# Patient Record
Sex: Female | Born: 1970 | Race: Black or African American | Hispanic: No | Marital: Single | Smoking: Never smoker
Health system: Southern US, Community
[De-identification: ages and names within clinical notes are randomized; demographics above are authoritative.]

## PROBLEM LIST (undated history)

## (undated) DIAGNOSIS — F419 Anxiety disorder, unspecified: Secondary | ICD-10-CM

## (undated) DIAGNOSIS — D649 Anemia, unspecified: Secondary | ICD-10-CM

## (undated) DIAGNOSIS — G4733 Obstructive sleep apnea (adult) (pediatric): Secondary | ICD-10-CM

## (undated) DIAGNOSIS — I10 Essential (primary) hypertension: Secondary | ICD-10-CM

## (undated) DIAGNOSIS — R51 Headache: Secondary | ICD-10-CM

## (undated) DIAGNOSIS — M1990 Unspecified osteoarthritis, unspecified site: Secondary | ICD-10-CM

## (undated) DIAGNOSIS — E119 Type 2 diabetes mellitus without complications: Secondary | ICD-10-CM

## (undated) DIAGNOSIS — R011 Cardiac murmur, unspecified: Secondary | ICD-10-CM

## (undated) DIAGNOSIS — M519 Unspecified thoracic, thoracolumbar and lumbosacral intervertebral disc disorder: Secondary | ICD-10-CM

## (undated) DIAGNOSIS — Z8709 Personal history of other diseases of the respiratory system: Secondary | ICD-10-CM

## (undated) DIAGNOSIS — F32A Depression, unspecified: Secondary | ICD-10-CM

## (undated) DIAGNOSIS — R519 Headache, unspecified: Secondary | ICD-10-CM

## (undated) DIAGNOSIS — T8859XA Other complications of anesthesia, initial encounter: Secondary | ICD-10-CM

## (undated) DIAGNOSIS — M545 Low back pain, unspecified: Secondary | ICD-10-CM

## (undated) DIAGNOSIS — J302 Other seasonal allergic rhinitis: Secondary | ICD-10-CM

## (undated) DIAGNOSIS — M5430 Sciatica, unspecified side: Secondary | ICD-10-CM

## (undated) DIAGNOSIS — E039 Hypothyroidism, unspecified: Secondary | ICD-10-CM

## (undated) DIAGNOSIS — G8929 Other chronic pain: Secondary | ICD-10-CM

## (undated) DIAGNOSIS — R7303 Prediabetes: Secondary | ICD-10-CM

## (undated) DIAGNOSIS — E079 Disorder of thyroid, unspecified: Secondary | ICD-10-CM

## (undated) DIAGNOSIS — T4145XA Adverse effect of unspecified anesthetic, initial encounter: Secondary | ICD-10-CM

## (undated) HISTORY — DX: Hypothyroidism, unspecified: E03.9

## (undated) HISTORY — DX: Unspecified thoracic, thoracolumbar and lumbosacral intervertebral disc disorder: M51.9

## (undated) HISTORY — DX: Essential (primary) hypertension: I10

## (undated) HISTORY — DX: Obstructive sleep apnea (adult) (pediatric): G47.33

---

## 1996-11-03 HISTORY — PX: DILATION AND CURETTAGE OF UTERUS: SHX78

## 2001-11-03 HISTORY — PX: SHOULDER OPEN ROTATOR CUFF REPAIR: SHX2407

## 2003-11-04 HISTORY — PX: CARPAL TUNNEL RELEASE: SHX101

## 2006-11-03 HISTORY — PX: CARPAL TUNNEL RELEASE: SHX101

## 2010-11-03 HISTORY — PX: TOTAL THYROIDECTOMY: SHX2547

## 2011-12-08 ENCOUNTER — Encounter (HOSPITAL_COMMUNITY): Payer: Self-pay | Admitting: *Deleted

## 2011-12-08 ENCOUNTER — Emergency Department (HOSPITAL_COMMUNITY)
Admission: EM | Admit: 2011-12-08 | Discharge: 2011-12-08 | Disposition: A | Discharge status: Home / Self Care | Payer: Self-pay | Source: Home / Self Care | Attending: Family Medicine | Admitting: Family Medicine

## 2011-12-08 DIAGNOSIS — J45901 Unspecified asthma with (acute) exacerbation: Secondary | ICD-10-CM

## 2011-12-08 DIAGNOSIS — J329 Chronic sinusitis, unspecified: Secondary | ICD-10-CM

## 2011-12-08 HISTORY — DX: Other seasonal allergic rhinitis: J30.2

## 2011-12-08 HISTORY — DX: Disorder of thyroid, unspecified: E07.9

## 2011-12-08 LAB — POCT RAPID STREP A: Streptococcus, Group A Screen (Direct): NEGATIVE

## 2011-12-08 MED ORDER — PREDNISONE 20 MG PO TABS: ORAL_TABLET | ORAL | Status: AC

## 2011-12-08 MED ORDER — AMOXICILLIN 500 MG PO CAPS: 500.0000 mg | ORAL_CAPSULE | Freq: Three times a day (TID) | ORAL | Status: AC

## 2011-12-08 MED ORDER — HYDROCODONE-ACETAMINOPHEN 7.5-500 MG/15ML PO SOLN: 15.0000 mL | Freq: Three times a day (TID) | ORAL | Status: AC | PRN

## 2011-12-08 NOTE — ED Notes (Signed)
Pt is here with complaints of 2 week history of fever, sore throat, bilat ear pain and runny nose.

## 2011-12-11 NOTE — ED Provider Notes (Signed)
History     CSN: [id number]  Arrival date & time 12/08/11  1908   First MD Initiated Contact with Patient 12/08/11 2019      Chief Complaint  Patient presents with  . Sore Throat  . Otalgia    (Consider location/radiation/quality/duration/timing/severity/associated sxs/prior treatment) HPI Comments: 41 y/o female with h/o hypothyroidism and asthma. Here c/o sore throat, fever, nasal congestion, sinus and ear pressure with yellow rhinorrhea for 2 weeks. Associated with headache and decreased appetite. No chest pain. Has had cough with white phlegm and using her albuterol inhaler more frequently has used twice today. Denies current wheezing or shortness of breath.   Past Medical History  Diagnosis Date  . Thyroid disease   . Asthma   . Seasonal allergies     Past Surgical History  Procedure Date  . Thyroid surgery   . Rotator cuff repair     History reviewed. No pertinent family history.  History  Substance Use Topics  . Smoking status: Never Smoker   . Smokeless tobacco: Not on file  . Alcohol Use: No    OB History    Grav Para Term Preterm Abortions TAB SAB Ect Mult Living                  Review of Systems  Constitutional: Positive for fever and appetite change.  HENT: Positive for ear pain, congestion, sore throat and sinus pressure. Negative for trouble swallowing, neck pain and ear discharge.   Eyes: Negative for discharge.  Respiratory: Positive for cough and wheezing.   Cardiovascular: Negative for chest pain, palpitations and leg swelling.  Gastrointestinal: Negative for nausea, vomiting and diarrhea.  Skin: Negative for rash.  Neurological: Positive for headaches.    Allergies  Review of patient's allergies indicates no known allergies.  Home Medications   Current Outpatient Rx  Name Route Sig Dispense Refill  . ALBUTEROL SULFATE HFA 108 (90 BASE) MCG/ACT IN AERS Inhalation Inhale 2 puffs into the lungs every 6 (six) hours as needed.    .  BECLOMETHASONE DIPROPIONATE 80 MCG/ACT IN AERS Inhalation Inhale 1 puff into the lungs as needed.    . CYCLOBENZAPRINE HCL 10 MG PO TABS Oral Take 10 mg by mouth 3 (three) times daily as needed.    . ERGOCALCIFEROL 50000 UNITS PO CAPS Oral Take 50,000 Units by mouth once a week.    Marland Kitchen FLUTICASONE PROPIONATE 50 MCG/ACT NA SUSP Nasal Place 2 sprays into the nose daily.    Marland Kitchen LEVOTHYROXINE SODIUM 175 MCG PO TABS Oral Take 175 mcg by mouth daily.    Marland Kitchen LORATADINE 10 MG PO TABS Oral Take 10 mg by mouth daily.    Marland Kitchen LOSARTAN POTASSIUM-HCTZ 100-25 MG PO TABS Oral Take 1 tablet by mouth daily.    . AMOXICILLIN 500 MG PO CAPS Oral Take 1 capsule (500 mg total) by mouth 3 (three) times daily. 21 capsule 0  . HYDROCODONE-ACETAMINOPHEN 7.5-500 MG/15ML PO SOLN Oral Take 15 mLs by mouth every 8 (eight) hours as needed for pain or cough. 120 mL 0  . PREDNISONE 20 MG PO TABS  2 tabs po daily for 5 days 10 tablet no    BP 158/84  Pulse 83  Temp(Src) 98.4 F (36.9 C) (Oral)  Resp 20  SpO2 100%  Physical Exam  Nursing note and vitals reviewed. Constitutional: She is oriented to person, place, and time. She appears well-developed and well-nourished. No distress.  HENT:  Head: Normocephalic and atraumatic.  Nasal Congestion with erythema and swelling of nasal turbinates, yellow rhinorrhea. Pharyngeal erythema no exudates. No uvula deviation. No trismus. TM's with normal light reflex, no dullness or redness, impress clear fluid behind bilaterally.   Eyes: Conjunctivae and EOM are normal. Pupils are equal, round, and reactive to light. Right eye exhibits no discharge. Left eye exhibits no discharge.  Neck: Neck supple. No JVD present. No thyromegaly present.  Cardiovascular: Normal rate, regular rhythm and normal heart sounds.   Pulmonary/Chest: Effort normal and breath sounds normal. No respiratory distress. She has no wheezes. She has no rales. She exhibits no tenderness.  Abdominal: Soft. There is no  tenderness.  Lymphadenopathy:    She has no cervical adenopathy.  Neurological: She is alert and oriented to person, place, and time.  Skin: Skin is warm. No rash noted.    ED Course  Procedures (including critical care time)   Labs Reviewed  POCT RAPID STREP A ([facility] CARE ONLY)  LAB REPORT - SCANNED   No results found.   1. Sinusitis   2. Asthma exacerbation       MDM  Treated with prednisone, amoxicillin, lortab. Continue asthma inhalers.         Sharin Grave, MD 12/11/11 1055

## 2012-02-12 ENCOUNTER — Ambulatory Visit: Payer: 59 | Admitting: Internal Medicine

## 2012-03-12 ENCOUNTER — Encounter: Payer: Self-pay | Admitting: Internal Medicine

## 2012-03-12 DIAGNOSIS — Z0001 Encounter for general adult medical examination with abnormal findings: Secondary | ICD-10-CM | POA: Insufficient documentation

## 2012-03-18 ENCOUNTER — Other Ambulatory Visit: Payer: 59

## 2012-03-18 ENCOUNTER — Ambulatory Visit (INDEPENDENT_AMBULATORY_CARE_PROVIDER_SITE_OTHER): Payer: 59 | Admitting: Internal Medicine

## 2012-03-18 ENCOUNTER — Encounter: Payer: Self-pay | Admitting: Internal Medicine

## 2012-03-18 ENCOUNTER — Ambulatory Visit: Payer: 59

## 2012-03-18 VITALS — BP 118/72 | HR 83 | Temp 97.0°F | Ht 64.0 in | Wt 345.4 lb

## 2012-03-18 DIAGNOSIS — Z0000 Encounter for general adult medical examination without abnormal findings: Secondary | ICD-10-CM

## 2012-03-18 DIAGNOSIS — J3089 Other allergic rhinitis: Secondary | ICD-10-CM | POA: Insufficient documentation

## 2012-03-18 DIAGNOSIS — J45998 Other asthma: Secondary | ICD-10-CM | POA: Insufficient documentation

## 2012-03-18 DIAGNOSIS — J302 Other seasonal allergic rhinitis: Secondary | ICD-10-CM | POA: Insufficient documentation

## 2012-03-18 DIAGNOSIS — G4710 Hypersomnia, unspecified: Secondary | ICD-10-CM

## 2012-03-18 DIAGNOSIS — E038 Other specified hypothyroidism: Secondary | ICD-10-CM | POA: Insufficient documentation

## 2012-03-18 DIAGNOSIS — I10 Essential (primary) hypertension: Secondary | ICD-10-CM | POA: Insufficient documentation

## 2012-03-18 LAB — BASIC METABOLIC PANEL
CO2: 29 mEq/L (ref 19–32)
Chloride: 101 mEq/L (ref 96–112)
Creatinine, Ser: 0.8 mg/dL (ref 0.4–1.2)
Sodium: 137 mEq/L (ref 135–145)

## 2012-03-18 LAB — URINALYSIS, ROUTINE W REFLEX MICROSCOPIC
Hgb urine dipstick: NEGATIVE
Leukocytes, UA: NEGATIVE
Specific Gravity, Urine: 1.015 (ref 1.000–1.030)
Urine Glucose: NEGATIVE
Urobilinogen, UA: 0.2 (ref 0.0–1.0)

## 2012-03-18 LAB — CBC WITH DIFFERENTIAL/PLATELET
Basophils Absolute: 0.1 10*3/uL (ref 0.0–0.1)
Basophils Relative: 0.5 % (ref 0.0–3.0)
Eosinophils Absolute: 0.1 10*3/uL (ref 0.0–0.7)
HCT: 36.6 % (ref 36.0–46.0)
Hemoglobin: 11.7 g/dL — ABNORMAL LOW (ref 12.0–15.0)
Monocytes Absolute: 0.6 10*3/uL (ref 0.1–1.0)
RBC: 4.42 Mil/uL (ref 3.87–5.11)
WBC: 9.7 10*3/uL (ref 4.5–10.5)

## 2012-03-18 LAB — HEPATIC FUNCTION PANEL
ALT: 20 U/L (ref 0–35)
Alkaline Phosphatase: 75 U/L (ref 39–117)
Bilirubin, Direct: 0.1 mg/dL (ref 0.0–0.3)
Total Protein: 8.1 g/dL (ref 6.0–8.3)

## 2012-03-18 LAB — LIPID PANEL: Total CHOL/HDL Ratio: 3

## 2012-03-18 MED ORDER — BECLOMETHASONE DIPROPIONATE 80 MCG/ACT IN AERS: 1.0000 | INHALATION_SPRAY | RESPIRATORY_TRACT | Status: DC | PRN

## 2012-03-18 MED ORDER — ALBUTEROL SULFATE HFA 108 (90 BASE) MCG/ACT IN AERS: 2.0000 | INHALATION_SPRAY | Freq: Four times a day (QID) | RESPIRATORY_TRACT | Status: DC | PRN

## 2012-03-18 NOTE — Progress Notes (Signed)
Subjective:    Patient ID: Vanessa [name], female    DOB: [birth year], 41 y.o.   MRN: [id number]  HPI  Here for wellness and to establish as new pt;  Overall doing ok;  Pt denies CP, worsening SOB, DOE, wheezing, orthopnea, PND, worsening LE edema, palpitations, dizziness or syncope.  Pt denies neurological change such as new Headache, facial or extremity weakness.  Pt denies polydipsia, polyuria, or low sugar symptoms. Pt states overall good compliance with treatment and medications, good tolerability, and trying to follow lower cholesterol diet.  Pt denies worsening depressive symptoms, suicidal ideation or panic. No fever, wt loss, night sweats, loss of appetite, or other constitutional symptoms.  Pt states good ability with ADL's, low fall risk, home safety reviewed and adequate, no significant changes in hearing or vision, and occasionally active with exercise.  Does have  LE mild edema to the legs off and on chronic.  Had thyroid test last mo per endo - normal, to f/u 1 yr.  Also has signficant daytime somnolence with snoring at night. Past Medical History  Diagnosis Date  . Seasonal allergies   . Asthma   . Hypertension   . Thyroid disease    Past Surgical History  Procedure Date  . Thyroid surgery   . Rotator cuff repair     reports that she has never smoked. She has never used smokeless tobacco. She reports that she does not drink alcohol or use illicit drugs. family history includes Hypertension in her others. No Known Allergies Current Outpatient Prescriptions on File Prior to Visit  Medication Sig Dispense Refill  . cyclobenzaprine (FLEXERIL) 10 MG tablet Take 10 mg by mouth 3 (three) times daily as needed.      . fluticasone (FLONASE) 50 MCG/ACT nasal spray Place 2 sprays into the nose daily.      Marland Kitchen levothyroxine (SYNTHROID, LEVOTHROID) 175 MCG tablet Take 175 mcg by mouth daily.      Marland Kitchen loratadine (CLARITIN) 10 MG tablet Take 10 mg by mouth daily.      Marland Kitchen  losartan-hydrochlorothiazide (HYZAAR) 100-25 MG per tablet Take 1 tablet by mouth daily.      Marland Kitchen DISCONTD: albuterol (PROVENTIL HFA;VENTOLIN HFA) 108 (90 BASE) MCG/ACT inhaler Inhale 2 puffs into the lungs every 6 (six) hours as needed.      Marland Kitchen DISCONTD: beclomethasone (QVAR) 80 MCG/ACT inhaler Inhale 1 puff into the lungs as needed.      . ergocalciferol (VITAMIN D2) 50000 UNITS capsule Take 50,000 Units by mouth once a week.       Review of Systems Review of Systems  Constitutional: Negative for diaphoresis, activity change, appetite change and unexpected weight change.  HENT: Negative for hearing loss, ear pain, facial swelling, mouth sores and neck stiffness.   Eyes: Negative for pain, redness and visual disturbance.  Respiratory: Negative for shortness of breath and wheezing.   Cardiovascular: Negative for chest pain and palpitations.  Gastrointestinal: Negative for diarrhea, blood in stool, abdominal distention and rectal pain.  Genitourinary: Negative for hematuria, flank pain and decreased urine volume.  Musculoskeletal: Negative for myalgias and joint swelling.  Skin: Negative for color change and wound.  Neurological: Negative for syncope and numbness.  Hematological: Negative for adenopathy.  Psychiatric/Behavioral: Negative for hallucinations, self-injury, decreased concentration and agitation.      Objective:   Physical Exam BP 118/72  Pulse 83  Temp(Src) 97 F (36.1 C) (Oral)  Ht 5\' 4"  (1.626 m)  Wt 345 lb 6 oz (156.661 kg)  BMI 59.28 kg/m2  SpO2 97% Physical Exam  VS noted Constitutional: Pt is oriented to person, place, and time. Appears morbid obese HENT:  Head: Normocephalic and atraumatic.  Right Ear: External ear normal.  Left Ear: External ear normal.  Nose: Nose normal.  Mouth/Throat: Oropharynx is clear and moist.  Eyes: Conjunctivae and EOM are normal. Pupils are equal, round, and reactive to light.  Neck: Normal range of motion. Neck supple. No JVD  present. No tracheal deviation present.  Cardiovascular: Normal rate, regular rhythm, normal heart sounds and intact distal pulses.   Pulmonary/Chest: Effort normal and breath sounds normal.  Abdominal: Soft. Bowel sounds are normal. There is no tenderness.  Musculoskeletal: Normal range of motion. Exhibits trace edema bilat to ankles  Lymphadenopathy:  Has no cervical adenopathy.  Neurological: Pt is alert and oriented to person, place, and time. Pt has normal reflexes. No cranial nerve deficit.  Skin: Skin is warm and dry. No rash noted.  Psychiatric:  Has  normal mood and affect. Behavior is normal.     Assessment & Plan:

## 2012-03-18 NOTE — Assessment & Plan Note (Signed)

## 2012-03-18 NOTE — Assessment & Plan Note (Signed)
Suspect signficant OSA - refer pulmonary,  to f/u any worsening symptoms or concerns

## 2012-03-18 NOTE — Patient Instructions (Signed)
Continue all other medications as before Your inhalers were refilled to [facility] Please go to LAB in the Basement for the blood and/or urine tests to be done today You will be contacted by phone if any changes need to be made immediately.  Otherwise, you will receive a letter about your results with an explanation. You will be contacted regarding the referral for: Pulmonary for the possible sleep apnea Please keep your appointments with your specialists as you have planned - thyroid doctor Please return in 1 year for your yearly visit, or sooner if needed, with Lab testing done 3-5 days before

## 2012-04-12 ENCOUNTER — Emergency Department (INDEPENDENT_AMBULATORY_CARE_PROVIDER_SITE_OTHER): Payer: 59

## 2012-04-12 ENCOUNTER — Emergency Department (HOSPITAL_COMMUNITY)
Admission: EM | Admit: 2012-04-12 | Discharge: 2012-04-12 | Disposition: A | Discharge status: Home / Self Care | Payer: 59 | Source: Home / Self Care

## 2012-04-12 ENCOUNTER — Encounter (HOSPITAL_COMMUNITY): Payer: Self-pay

## 2012-04-12 DIAGNOSIS — S43429A Sprain of unspecified rotator cuff capsule, initial encounter: Secondary | ICD-10-CM

## 2012-04-12 DIAGNOSIS — IMO0002 Reserved for concepts with insufficient information to code with codable children: Secondary | ICD-10-CM

## 2012-04-12 MED ORDER — HYDROCODONE-ACETAMINOPHEN 5-500 MG PO TABS: 1.0000 | ORAL_TABLET | Freq: Four times a day (QID) | ORAL | Status: AC | PRN

## 2012-04-12 NOTE — ED Provider Notes (Signed)
History     CSN: [id number]  Arrival date & time 04/12/12  1059   First MD Initiated Contact with Patient 04/12/12 1102    (Consider location/radiation/quality/duration/timing/severity/associated sxs/prior treatment)  HPI  Patient is 41 year old female who presents with main concern of progressively worsening right shoulder pain. This initially started 3 days ago and patient reports it happened while at work as she was lifting heavy objects. Patient reports similar pain in the past and has been diagnosed with rotator cuff. She reports the pain as sharp, intermittent in nature, nonradiating, 5/10 in severity when present, no specific alleviating factors, aggravated by extension of the arm and shoulder. Patient also reports unable to lift anything with right arm due to pain. She denies fevers, chills, other associated systemic symptoms.  Past Medical History  Diagnosis Date  . Seasonal allergies   . Asthma   . Hypertension   . Thyroid disease     Past Surgical History  Procedure Date  . Thyroid surgery   . Rotator cuff repair     Family History  Problem Relation Age of Onset  . Hypertension Other   . Hypertension Other     History  Substance Use Topics  . Smoking status: Never Smoker   . Smokeless tobacco: Never Used  . Alcohol Use: No    OB History    Grav Para Term Preterm Abortions TAB SAB Ect Mult Living                  Review of Systems Constitutional: Denies fever, chills, diaphoresis, appetite change and fatigue.   Respiratory: Denies SOB, DOE, cough, chest tightness,  and wheezing.   Cardiovascular: Denies chest pain, palpitations and leg swelling.  Gastrointestinal: Denies nausea, vomiting, abdominal pain, diarrhea, constipation, blood in stool and abdominal distention.  Musculoskeletal: Denies myalgias, back pain, joint swelling, arthralgias and gait problem.  Neurological: Denies dizziness, seizures, syncope, weakness, light-headedness, numbness and  headaches.   Allergies  Review of patient's allergies indicates no known allergies.  Home Medications   Current Outpatient Rx  Name Route Sig Dispense Refill  . ALBUTEROL SULFATE HFA 108 (90 BASE) MCG/ACT IN AERS Inhalation Inhale 2 puffs into the lungs every 6 (six) hours as needed. 1 Inhaler 11  . BECLOMETHASONE DIPROPIONATE 80 MCG/ACT IN AERS Inhalation Inhale 1 puff into the lungs as needed. 1 Inhaler 11  . CYCLOBENZAPRINE HCL 10 MG PO TABS Oral Take 10 mg by mouth 3 (three) times daily as needed.    . ERGOCALCIFEROL 50000 UNITS PO CAPS Oral Take 50,000 Units by mouth once a week.    Marland Kitchen FLUTICASONE PROPIONATE 50 MCG/ACT NA SUSP Nasal Place 2 sprays into the nose daily.    Marland Kitchen LEVOTHYROXINE SODIUM 175 MCG PO TABS Oral Take 175 mcg by mouth daily.    Marland Kitchen LORATADINE 10 MG PO TABS Oral Take 10 mg by mouth daily.    Marland Kitchen LOSARTAN POTASSIUM-HCTZ 100-25 MG PO TABS Oral Take 1 tablet by mouth daily.      There were no vitals taken for this visit.  Physical Exam  Constitutional: Vital signs reviewed.  Patient is a well-developed and well-nourished in no acute distress and cooperative with exam. Alert and oriented x3.  Cardiovascular: RRR, S1 normal, S2 normal, no MRG, pulses symmetric and intact bilaterally Pulmonary/Chest: CTAB, no wheezes, rales, or rhonchi Abdominal: Soft. Non-tender, non-distended, bowel sounds are normal, no masses, organomegaly, or guarding present.  Musculoskeletal: No joint deformities, erythema, or stiffness, ROM full in all extremities except  right shoulder, patient unable to extend shoulder above 90 , also having difficulty with flexion, adduction and abduction of the right shoulder  Hematology: no cervical, inginal, or axillary adenopathy.  Neurological: A&O x3, Strenght is normal and symmetric bilaterally, cranial nerve II-XII are grossly intact, no focal motor deficit, sensory intact to light touch bilaterally.    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  No acute findings.   Right AC joint spur, sprain - I have advised pt to rest as needed, with mild physical activity - pain medication to use as needed for adequate pain control and to see PCP if symptoms do not get better or get worse in next 1-2 weeks  MDM   Subacromial spur - treat with rest and analgesia for adequate pain relief        Dorothea Ogle, MD 04/12/12 1217

## 2012-04-12 NOTE — Discharge Instructions (Signed)
Ligament Sprain  Ligaments are tough, fibrous tissues that hold bones together at the joints. A sprain can occur when a ligament is stretched. This injury may take several weeks to heal.  HOME CARE INSTRUCTIONS    Rest the injured area for as long as directed by your caregiver. Then slowly start using the joint as directed by your caregiver and as the pain allows.   Keep the affected joint raised if possible to lessen swelling.   Apply ice for 15 to 20 minutes to the injured area every couple hours for the first half day, then 3 to 4 times per day for the first 48 hours. Put the ice in a plastic bag and place a towel between the bag of ice and your skin.   Wear any splinting, casting, or elastic bandage applications as instructed.   Only take over-the-counter or prescription medicines for pain, discomfort, or fever as directed by your caregiver. Do not use aspirin immediately after the injury unless instructed by your caregiver. Aspirin can cause increased bleeding and bruising of the tissues.   If you were given crutches, continue to use them as instructed and do not resume weight bearing on the affected extremity until instructed.  SEEK MEDICAL CARE IF:    Your bruising, swelling, or pain increases.   You have cold and numb fingers or toes if your arm or leg was injured.  SEEK IMMEDIATE MEDICAL CARE IF:    Your toes are numb or blue if your leg was injured.   Your fingers are numb or blue if your arm was injured.   Your pain is not responding to medicines and continues to stay the same or gets worse.  MAKE SURE YOU:    Understand these instructions.   Will watch your condition.   Will get help right away if you are not doing well or get worse.  Document Released: 10/17/2000 Document Revised: 10/09/2011 Document Reviewed: 08/15/2008  [employer] Patient Information 2012 [employer].

## 2012-04-12 NOTE — ED Notes (Signed)
denies injury; "I think I strained my right shoulder while I was cleaning my room"; tender right scapular /shoulder area; NAD

## 2012-04-14 ENCOUNTER — Institutional Professional Consult (permissible substitution): Payer: 59 | Admitting: Pulmonary Disease

## 2012-04-20 ENCOUNTER — Telehealth: Payer: Self-pay | Admitting: Internal Medicine

## 2012-04-20 MED ORDER — LOSARTAN POTASSIUM-HCTZ 100-25 MG PO TABS: 1.0000 | ORAL_TABLET | Freq: Every day | ORAL | Status: DC

## 2012-04-20 NOTE — Telephone Encounter (Signed)
Pt req refill for Losartan 100-25 mg to be send to [place] on [address] Any question please call pt and call her once this is done.

## 2012-04-20 NOTE — Telephone Encounter (Signed)
Pt advised.

## 2012-04-27 ENCOUNTER — Institutional Professional Consult (permissible substitution): Payer: 59 | Admitting: Pulmonary Disease

## 2012-05-13 ENCOUNTER — Ambulatory Visit: Payer: 59 | Admitting: Internal Medicine

## 2012-05-27 ENCOUNTER — Ambulatory Visit (INDEPENDENT_AMBULATORY_CARE_PROVIDER_SITE_OTHER): Payer: 59 | Admitting: Pulmonary Disease

## 2012-05-27 ENCOUNTER — Encounter: Payer: Self-pay | Admitting: Pulmonary Disease

## 2012-05-27 ENCOUNTER — Encounter: Payer: Self-pay | Admitting: *Deleted

## 2012-05-27 VITALS — BP 120/94 | HR 91 | Temp 97.9°F | Ht 65.0 in | Wt 339.6 lb

## 2012-05-27 DIAGNOSIS — G4710 Hypersomnia, unspecified: Secondary | ICD-10-CM

## 2012-05-27 DIAGNOSIS — J45909 Unspecified asthma, uncomplicated: Secondary | ICD-10-CM

## 2012-05-27 NOTE — Patient Instructions (Signed)
Schedule sleep study

## 2012-05-27 NOTE — Progress Notes (Signed)
  Subjective:    Patient ID: Vanessa [name], female    DOB: 1970/11/12, 41 y.o.   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 She reports excessive daytime somnolence x 5 yrs inspite of adequate sleep at night. ESS 19/24. She reports sleepiness while watching TV, sitting inactive ina  Public place, in the afternoons & sometimes even when talking to people. She does not drive. Bedtime is 8-9pm, latency is variable , sometimes upto 2 h, she sleeps on her stomach with 2 pillows, she reports frequent awakenings every 2-3 h, oob variable around 5-6 am, nocturia +. Loud snoring & gasping during sleep has been noted by family members. Denies excessive caffeinated beverages. She also reports mild intermittent asthma with nocturnal symptoms once/ month, last prednisone use 1 yr ago Wt is steady at 339 lbs  Past Medical History  Diagnosis Date  . Seasonal allergies   . Asthma   . Hypertension   . Thyroid disease    Past Surgical History  Procedure Date  . Thyroid surgery   . Rotator cuff repair   . Carpal tunnel release    No Known Allergies  History   Social History  . Marital Status: Single    Spouse Name: N/A    Number of Children: 1  . Years of Education: N/A   Occupational History  . house keeper [employer]   Social History Main Topics  . Smoking status: Never Smoker   . Smokeless tobacco: Never Used  . Alcohol Use: No  . Drug Use: No  . Sexually Active: Not on file   Other Topics Concern  . Not on file   Social History Narrative  . No narrative on file     Review of Systems  Constitutional: Negative for fever, appetite change and unexpected weight change.  HENT: Positive for sore throat. Negative for ear pain, congestion, trouble swallowing and dental problem.   Respiratory: Positive for cough and shortness of breath.   Cardiovascular: Positive for chest pain, palpitations and leg  swelling.  Gastrointestinal: Positive for abdominal pain.  Musculoskeletal: Positive for joint swelling and arthralgias.  Skin: Negative for rash.  Neurological: Positive for headaches.  Psychiatric/Behavioral: Negative for dysphoric mood. The patient is not nervous/anxious.        Objective:   Physical Exam  Gen. Pleasant, obese, in no distress, normal affect ENT - no lesions, no post nasal drip, class 2-3 airway, large tonsils Neck: No JVD, no thyromegaly, no carotid bruits Lungs: no use of accessory muscles, no dullness to percussion, decreased without rales or rhonchi  Cardiovascular: Rhythm regular, heart sounds  normal, no murmurs or gallops, no peripheral edema Abdomen: soft and non-tender, no hepatosplenomegaly, BS normal. Musculoskeletal: No deformities, no cyanosis or clubbing Neuro:  alert, non focal, no tremors       Assessment & Plan:

## 2012-05-27 NOTE — Assessment & Plan Note (Signed)
Given excessive daytime somnolence, narrow pharyngeal exam, morbid obesity, witnessed apneas & loud snoring, obstructive sleep apnea is very likely & an overnight polysomnogram will be scheduled as a split study. The pathophysiology of obstructive sleep apnea , it's cardiovascular consequences & modes of treatment including CPAP were discused with the patient in detail & they evidenced understanding. Doubt tonsillectomy indicated here.

## 2012-05-27 NOTE — Assessment & Plan Note (Signed)
Triggered by chemicals  Use qvar daily - explained use of maintenance steroid inhaler

## 2012-06-09 ENCOUNTER — Encounter (HOSPITAL_COMMUNITY): Payer: Self-pay

## 2012-06-09 ENCOUNTER — Emergency Department (HOSPITAL_COMMUNITY)
Admission: EM | Admit: 2012-06-09 | Discharge: 2012-06-09 | Disposition: A | Discharge status: Home / Self Care | Payer: Self-pay | Source: Home / Self Care | Attending: Family Medicine | Admitting: Family Medicine

## 2012-06-09 DIAGNOSIS — L253 Unspecified contact dermatitis due to other chemical products: Secondary | ICD-10-CM

## 2012-06-09 MED ORDER — FLUTICASONE PROPIONATE 0.05 % EX CREA: TOPICAL_CREAM | Freq: Two times a day (BID) | CUTANEOUS | Status: DC

## 2012-06-09 NOTE — ED Provider Notes (Signed)
History     CSN: [id number]  Arrival date & time 06/09/12  1700   First MD Initiated Contact with Patient 06/09/12 1708      Chief Complaint  Patient presents with  . Chemical Exposure    (Consider location/radiation/quality/duration/timing/severity/associated sxs/prior treatment) Patient is a 41 y.o. female presenting with rash. The history is provided by the patient.  Rash  This is a new problem. The current episode started 1 to 2 hours ago. The problem has been gradually improving (chemical splashed up onto left face from cart, no eye or mouth exposure, washed immdiately with water.). The problem is associated with chemical exposure. There has been no fever. The rash is present on the face. The patient is experiencing no pain. Associated symptoms include itching. She has tried nothing for the symptoms.    Past Medical History  Diagnosis Date  . Seasonal allergies   . Asthma   . Hypertension   . Thyroid disease     Past Surgical History  Procedure Date  . Thyroid surgery   . Rotator cuff repair   . Carpal tunnel release     Family History  Problem Relation Age of Onset  . Hypertension Other   . Asthma Mother   . Heart disease Mother     History  Substance Use Topics  . Smoking status: Never Smoker   . Smokeless tobacco: Never Used  . Alcohol Use: No    OB History    Grav Para Term Preterm Abortions TAB SAB Ect Mult Living                  Review of Systems  Constitutional: Negative.   HENT: Negative.   Skin: Positive for itching and rash.    Allergies  Review of patient's allergies indicates no known allergies.  Home Medications   Current Outpatient Rx  Name Route Sig Dispense Refill  . ALBUTEROL SULFATE HFA 108 (90 BASE) MCG/ACT IN AERS Inhalation Inhale 2 puffs into the lungs every 6 (six) hours as needed. 1 Inhaler 11  . BECLOMETHASONE DIPROPIONATE 80 MCG/ACT IN AERS Inhalation Inhale 1 puff into the lungs as needed. 1 Inhaler 11  .  CYCLOBENZAPRINE HCL 10 MG PO TABS Oral Take 10 mg by mouth 3 (three) times daily as needed.    . ERGOCALCIFEROL 50000 UNITS PO CAPS Oral Take 50,000 Units by mouth once a week.    Marland Kitchen FLUTICASONE PROPIONATE 0.05 % EX CREA Topical Apply topically 2 (two) times daily. 30 g 0  . FLUTICASONE PROPIONATE 50 MCG/ACT NA SUSP Nasal Place 2 sprays into the nose daily.    Marland Kitchen LEVOTHYROXINE SODIUM 200 MCG PO TABS Oral Take 200 mcg by mouth daily.    Marland Kitchen LORATADINE 10 MG PO TABS Oral Take 10 mg by mouth daily.    Marland Kitchen LOSARTAN POTASSIUM-HCTZ 100-25 MG PO TABS Oral Take 1 tablet by mouth daily. 90 tablet 3    BP 113/75  Pulse 77  Temp 98.5 F (36.9 C) (Oral)  Resp 18  SpO2 96%  Physical Exam  Nursing note and vitals reviewed. Constitutional: She is oriented to person, place, and time. She appears well-developed and well-nourished.  HENT:  Head: Normocephalic and atraumatic.  Neurological: She is alert and oriented to person, place, and time.  Skin: Skin is warm and dry. No rash noted.       No visible blistering or skin irritation evident.    ED Course  Procedures (including critical care time)  Labs Reviewed -  No data to display No results found.   1. Dermatitis, contact, from chemicals       MDM          Linna Hoff, MD 06/14/12 [phone]

## 2012-06-09 NOTE — ED Notes (Signed)
states chemical OTJ earlier this PM. States she imeduatly washed her face w water , and is currently c/o facial itching. NAD, denies eye issues w the chemical she used. Using "purple stuff" cleanser from [facility] "A-[id number]" disinfectant

## 2012-06-22 ENCOUNTER — Encounter (HOSPITAL_BASED_OUTPATIENT_CLINIC_OR_DEPARTMENT_OTHER): Payer: 59

## 2012-06-23 ENCOUNTER — Telehealth: Payer: Self-pay

## 2012-06-23 MED ORDER — LOSARTAN POTASSIUM-HCTZ 100-25 MG PO TABS: 1.0000 | ORAL_TABLET | Freq: Every day | ORAL | Status: DC

## 2012-06-23 NOTE — Telephone Encounter (Signed)
Done twice per pt reqeust

## 2012-06-23 NOTE — Telephone Encounter (Signed)
Called the patients home number left message prescriptions requested sent to pharmacy

## 2012-06-23 NOTE — Telephone Encounter (Signed)
Pt called requesting 2 separate prescriptions for her BP medications for cost saving to [facility] pharmacy,

## 2012-07-22 ENCOUNTER — Telehealth: Payer: Self-pay | Admitting: *Deleted

## 2012-07-22 NOTE — Telephone Encounter (Signed)
Phone call to patient to R/S follow up appt with [facility] 07/23/12 at 1:30--UNABLE TO LEAVE VMAIL. Patient was supposed to have a sleep study done back in July/August(beginning) and cancelled the appointment. She told Alida at she would call back and reschedule once she figured out her work schedule and she never rescheduled her study. There is no reason for patient to keep f/u seeing that she did not have the study.   I called patient to reschedule but she does not have a vmail set up to leave a message--only one number on file. Unable to reach patient. Attempted x1--will try again.

## 2012-07-22 NOTE — Telephone Encounter (Signed)
Spoke with patient in regards to appt scheduled tomorrow with Vassie Loll and she stated that she wanted to cancel that appointment since she did not have her study done. I offered for her to speak with [facility] to R/S her study and she refused stating that she will have to check her work calendar before she can reschedule anything. Patient states that she will give [place] a call back once she figures out when she wants to do the study.  Appt with Alva cancelled for 07/23/12.

## 2012-07-23 ENCOUNTER — Ambulatory Visit: Payer: 59 | Admitting: Pulmonary Disease

## 2012-09-23 ENCOUNTER — Other Ambulatory Visit: Payer: Self-pay | Admitting: Internal Medicine

## 2012-09-23 ENCOUNTER — Telehealth: Payer: Self-pay | Admitting: Internal Medicine

## 2012-09-23 MED ORDER — CYCLOBENZAPRINE HCL 10 MG PO TABS: 10.0000 mg | ORAL_TABLET | Freq: Three times a day (TID) | ORAL | Status: DC | PRN

## 2012-09-23 NOTE — Telephone Encounter (Signed)
Pharmacy changed and refill flexeril sent per request

## 2012-09-29 ENCOUNTER — Emergency Department (HOSPITAL_COMMUNITY)
Admission: EM | Admit: 2012-09-29 | Discharge: 2012-09-29 | Disposition: A | Discharge status: Home / Self Care | Payer: 59 | Source: Home / Self Care | Attending: Emergency Medicine | Admitting: Emergency Medicine

## 2012-09-29 ENCOUNTER — Encounter (HOSPITAL_COMMUNITY): Payer: Self-pay | Admitting: *Deleted

## 2012-09-29 DIAGNOSIS — R5381 Other malaise: Secondary | ICD-10-CM

## 2012-09-29 DIAGNOSIS — IMO0002 Reserved for concepts with insufficient information to code with codable children: Secondary | ICD-10-CM

## 2012-09-29 DIAGNOSIS — M25569 Pain in unspecified knee: Secondary | ICD-10-CM

## 2012-09-29 DIAGNOSIS — S8390XA Sprain of unspecified site of unspecified knee, initial encounter: Secondary | ICD-10-CM

## 2012-09-29 DIAGNOSIS — E038 Other specified hypothyroidism: Secondary | ICD-10-CM

## 2012-09-29 DIAGNOSIS — Z0000 Encounter for general adult medical examination without abnormal findings: Secondary | ICD-10-CM

## 2012-09-29 MED ORDER — TRAMADOL HCL 50 MG PO TABS: 100.0000 mg | ORAL_TABLET | Freq: Three times a day (TID) | ORAL | Status: DC | PRN

## 2012-09-29 NOTE — ED Notes (Signed)
Pt  Reports  She  Stepped  Off a   [employer]  One  Week  Ago  And  Injured  Her  l  Knee   She  Reports    The pain is  Worse  When  She  Bears  Weight on the  Affected  Knee  And        She  Reports  Her  Sciatica  Is  Flaring up as   Well

## 2012-09-29 NOTE — ED Notes (Signed)
Patient called back; states her job requires her to be able to work as scheduled, and to work w no restrictions. New work note provided as requested, ans faxed to her job pre pt request

## 2012-09-29 NOTE — ED Provider Notes (Signed)
Chief Complaint  Patient presents with  . Knee Injury    History of Present Illness:   Vanessa [name] is a 41-year-old female who injured her left knee a week ago. She [place] on a city bus, twisted her knee, her pop. Ever since then she's had swelling and the knee hurts to walk or to bend it. It sometimes gives way and locks up. She has had no prior history of knee pain or injury. She denies any numbness, tingling, or weakness.  Review of Systems:  Other than noted above, the patient denies any of the following symptoms: Systemic:  No fevers, chills, sweats, or aches.  No fatigue or tiredness. Musculoskeletal:  No joint pain, arthritis, bursitis, swelling, back pain, or neck pain.  Neurological:  No muscular weakness, paresthesias, headache, or trouble with speech or coordination.  No dizziness.  PMFSH:  Past medical history, family history, social history, meds, and allergies were reviewed.  No prior history of knee pain or arthritis.  Physical Exam:   Vital signs:  BP 150/89  Pulse 100  Temp 98.9 F (37.2 C) (Oral)  Resp 20  SpO2 95% Gen:  Alert and oriented times 3.  In no distress. Musculoskeletal: There was no obvious swelling, although the patient is morbidly obese and is difficult to say with any assurance that there is no fluid or effusion. The knee has a very limited range of motion with pain, she was just able to bend a few degrees. There is pain to palpation over the entire knee joint.  Unable to do McMurray sign, Lachman's sign, drawer test, or varus or valgus stress due to patient discomfort and inability to bend the knee. Otherwise, all joints had a full a ROM with no swelling, bruising or deformity.  No edema, pulses full. Extremities were warm and pink.  Capillary refill was brisk.  Skin:  Clear, warm and dry.  No rash. Neuro:  Alert and oriented times 3.  Muscle strength was normal.  Sensation was intact to light touch.   Assessment:  The encounter diagnosis was Knee  sprain.  Plan:   1.  The following meds were prescribed:   New Prescriptions   TRAMADOL (ULTRAM) 50 MG TABLET    Take 2 tablets (100 mg total) by mouth every 8 (eight) hours as needed for pain.   2.  The patient was instructed in symptomatic care, including rest and activity, elevation, application of ice and compression.  Appropriate handouts were given. 3.  The patient was told to return if becoming worse in any way, if no better in 3 or 4 days, and given some red flag symptoms that would indicate earlier return.   4.  The patient was told to follow up with Dr. Lajoyce Corners next week.    Reuben Likes, MD 09/29/12 1630

## 2012-10-04 ENCOUNTER — Telehealth: Payer: Self-pay

## 2012-10-04 NOTE — Telephone Encounter (Signed)
Pt called c/o of bilateral leg swelling. Pt is requesting an increase in diuretic portion of BP medication. Pt recently picked up BP medication and would prefer a separate Rx for HCTZ if MD agrees to increase.

## 2012-10-04 NOTE — Telephone Encounter (Signed)
Patient informed of MD instructions. 

## 2012-10-04 NOTE — Telephone Encounter (Signed)
Pt already on 25 mg hct in the hyzaar which is the highest strength that normally works  Lower salt diet, leg elevation, lose wt if overwt , and compression stockings for the legs can also be considered  If does not work, may need to change to lasix, but this does not work well if the underlying problem is more venous insufficiency than anything else, such as heart failure

## 2012-11-09 ENCOUNTER — Other Ambulatory Visit (INDEPENDENT_AMBULATORY_CARE_PROVIDER_SITE_OTHER): Payer: 59

## 2012-11-09 ENCOUNTER — Ambulatory Visit (INDEPENDENT_AMBULATORY_CARE_PROVIDER_SITE_OTHER): Payer: 59 | Admitting: Internal Medicine

## 2012-11-09 ENCOUNTER — Other Ambulatory Visit: Payer: Self-pay | Admitting: Internal Medicine

## 2012-11-09 ENCOUNTER — Encounter: Payer: Self-pay | Admitting: Internal Medicine

## 2012-11-09 VITALS — BP 116/72 | HR 90 | Temp 98.5°F | Ht 65.0 in | Wt 337.0 lb

## 2012-11-09 DIAGNOSIS — M25562 Pain in left knee: Secondary | ICD-10-CM | POA: Insufficient documentation

## 2012-11-09 DIAGNOSIS — R5381 Other malaise: Secondary | ICD-10-CM

## 2012-11-09 DIAGNOSIS — R5383 Other fatigue: Secondary | ICD-10-CM | POA: Insufficient documentation

## 2012-11-09 DIAGNOSIS — E039 Hypothyroidism, unspecified: Secondary | ICD-10-CM

## 2012-11-09 DIAGNOSIS — E038 Other specified hypothyroidism: Secondary | ICD-10-CM

## 2012-11-09 DIAGNOSIS — Z0000 Encounter for general adult medical examination without abnormal findings: Secondary | ICD-10-CM

## 2012-11-09 DIAGNOSIS — M25569 Pain in unspecified knee: Secondary | ICD-10-CM

## 2012-11-09 LAB — CBC WITH DIFFERENTIAL/PLATELET
Basophils Absolute: 0 10*3/uL (ref 0.0–0.1)
Hemoglobin: 12.3 g/dL (ref 12.0–15.0)
Lymphocytes Relative: 23.9 % (ref 12.0–46.0)
Monocytes Relative: 5.9 % (ref 3.0–12.0)
Platelets: 305 10*3/uL (ref 150.0–400.0)
RDW: 17.8 % — ABNORMAL HIGH (ref 11.5–14.6)
WBC: 12.3 10*3/uL — ABNORMAL HIGH (ref 4.5–10.5)

## 2012-11-09 LAB — BASIC METABOLIC PANEL
BUN: 14 mg/dL (ref 6–23)
CO2: 29 mEq/L (ref 19–32)
Calcium: 9.1 mg/dL (ref 8.4–10.5)
Glucose, Bld: 97 mg/dL (ref 70–99)
Sodium: 135 mEq/L (ref 135–145)

## 2012-11-09 LAB — HEPATIC FUNCTION PANEL
AST: 17 U/L (ref 0–37)
Albumin: 3.1 g/dL — ABNORMAL LOW (ref 3.5–5.2)
Alkaline Phosphatase: 73 U/L (ref 39–117)
Total Protein: 8.3 g/dL (ref 6.0–8.3)

## 2012-11-09 MED ORDER — LEVOTHYROXINE SODIUM 175 MCG PO TABS: 175.0000 ug | ORAL_TABLET | Freq: Every day | ORAL | Status: DC

## 2012-11-09 MED ORDER — LORATADINE 10 MG PO TABS: 10.0000 mg | ORAL_TABLET | Freq: Every day | ORAL | Status: DC

## 2012-11-09 MED ORDER — TRAMADOL HCL 50 MG PO TABS: 100.0000 mg | ORAL_TABLET | Freq: Four times a day (QID) | ORAL | Status: DC | PRN

## 2012-11-09 NOTE — Assessment & Plan Note (Signed)
Asympt,good compliance,  for tsh

## 2012-11-09 NOTE — Assessment & Plan Note (Signed)
Ok for tramadol prn, encouraged pt to re-schedule with orthopedic

## 2012-11-09 NOTE — Progress Notes (Signed)
Subjective:    Patient ID: Vanessa [name], female    DOB: [birth year], 42 y.o.   MRN: [id number]  HPI  Here to f/u, with c/o 1-2 mo increased fatigue it seems, has been putting off sleep apnea testing due to work schedule, and put off seeing orthopedic today as she is asking for thyroid testing to be done and considers more important;  Does still have signficiant left knee pain, aching with mild swelling and ? Tendency to giveaway after stepping off a bus with a longer step than expected with knee strain per ER.  Pt denies chest pain, increased sob or doe, wheezing, orthopnea, PND, increased LE swelling, palpitations, dizziness or syncope.   Pt denies polydipsia, polyuria.  Pt denies new neurological symptoms such as new headache, or facial or extremity weakness or numbness. Denies worsening depressive symptoms, suicidal ideation, or panic Past Medical History  Diagnosis Date  . Seasonal allergies   . Asthma   . Hypertension   . Thyroid disease    Past Surgical History  Procedure Date  . Thyroid surgery   . Rotator cuff repair   . Carpal tunnel release     reports that she has never smoked. She has never used smokeless tobacco. She reports that she does not drink alcohol or use illicit drugs. family history includes Asthma in her mother; Heart disease in her mother; and Hypertension in her other. No Known Allergies Current Outpatient Prescriptions on File Prior to Visit  Medication Sig Dispense Refill  . albuterol (PROVENTIL HFA;VENTOLIN HFA) 108 (90 BASE) MCG/ACT inhaler Inhale 2 puffs into the lungs every 6 (six) hours as needed.  1 Inhaler  11  . beclomethasone (QVAR) 80 MCG/ACT inhaler Inhale 1 puff into the lungs as needed.  1 Inhaler  11  . cyclobenzaprine (FLEXERIL) 10 MG tablet Take 1 tablet (10 mg total) by mouth 3 (three) times daily as needed.  90 tablet  1  . ergocalciferol (VITAMIN D2) 50000 UNITS capsule Take 50,000 Units by mouth once a week.      . fluticasone (CUTIVATE) 0.05  % cream Apply topically 2 (two) times daily.  30 g  0  . fluticasone (FLONASE) 50 MCG/ACT nasal spray Place 2 sprays into the nose daily.      Marland Kitchen loratadine (CLARITIN) 10 MG tablet Take 1 tablet (10 mg total) by mouth daily.  90 tablet  3  . losartan-hydrochlorothiazide (HYZAAR) 100-25 MG per tablet Take 1 tablet by mouth daily.  90 tablet  3   Review of Systems  Constitutional: Negative for diaphoresis and unexpected weight change.  HENT: Negative for tinnitus.   Eyes: Negative for photophobia and visual disturbance.  Respiratory: Negative for choking and stridor.   Gastrointestinal: Negative for vomiting and blood in stool.  Genitourinary: Negative for hematuria and decreased urine volume.  Musculoskeletal: Negative for gait problem.  Skin: Negative for color change and wound.  Neurological: Negative for tremors and numbness.  Psychiatric/Behavioral: Negative for decreased concentration. The patient is not hyperactive.       Objective:   Physical Exam BP 116/72  Pulse 90  Temp 98.5 F (36.9 C) (Oral)  Ht 5\' 5"  (1.651 m)  Wt 337 lb (152.862 kg)  BMI 56.08 kg/m2  SpO2 98% Physical Exam  VS noted Constitutional: Pt appears well-developed and well-nourished.  HENT: Head: Normocephalic.  Right Ear: External ear normal.  Left Ear: External ear normal.  Eyes: Conjunctivae and EOM are normal. Pupils are equal, round, and reactive to light.  Neck: Normal range of motion. Neck supple.  Cardiovascular: Normal rate and regular rhythm.   Pulmonary/Chest: Effort normal and breath sounds normal.  Abd:  Soft, NT, non-distended, + BS Neurological: Pt is alert. Not confused  Skin: Skin is warm. No erythema.  Psychiatric: Pt behavior is normal. Thought content normal.  Left knee with small effusion, NT, with mild decreased RO    Assessment & Plan:

## 2012-11-09 NOTE — Assessment & Plan Note (Signed)
Exam benign but to check tsh per pt request as well as cbc, bmet, encourage pt to continue sleep apnea eval

## 2012-11-09 NOTE — Patient Instructions (Addendum)
Continue all other medications as before - the refill of the tramadol (sent to [facility] outpatient pharmacy), and the claritin Please have the pharmacy call with any other refills you may need. Please go to LAB in the Basement for the blood and/or urine tests to be done today You will be contacted by phone if any changes need to be made immediately.  Otherwise, you will receive a letter about your results with an explanation, but please check with MyChart first. Thank you for enrolling in MyChart. Please follow the instructions below to securely access your online medical record. MyChart allows you to send messages to your doctor, view your test results, renew your prescriptions, schedule appointments, and more. To Log into MyChart, please go to [email or web address], and your Username is: [email or web address] (pass [id number]) Please be sure to re-schedule with your orthopedic about the knee, as well as the Sleep Apnea testing with Dr Vassie Loll Please return in 6 mo with Lab testing done 3-5 days before

## 2012-12-18 ENCOUNTER — Other Ambulatory Visit: Payer: Self-pay

## 2012-12-31 ENCOUNTER — Ambulatory Visit (INDEPENDENT_AMBULATORY_CARE_PROVIDER_SITE_OTHER): Payer: 59 | Admitting: Internal Medicine

## 2012-12-31 ENCOUNTER — Encounter: Payer: Self-pay | Admitting: Internal Medicine

## 2012-12-31 ENCOUNTER — Other Ambulatory Visit (INDEPENDENT_AMBULATORY_CARE_PROVIDER_SITE_OTHER): Payer: 59

## 2012-12-31 VITALS — BP 120/80 | HR 95 | Temp 97.4°F | Ht 65.0 in | Wt 330.0 lb

## 2012-12-31 DIAGNOSIS — I10 Essential (primary) hypertension: Secondary | ICD-10-CM

## 2012-12-31 DIAGNOSIS — M545 Low back pain, unspecified: Secondary | ICD-10-CM

## 2012-12-31 DIAGNOSIS — IMO0002 Reserved for concepts with insufficient information to code with codable children: Secondary | ICD-10-CM

## 2012-12-31 DIAGNOSIS — M5416 Radiculopathy, lumbar region: Secondary | ICD-10-CM

## 2012-12-31 DIAGNOSIS — J302 Other seasonal allergic rhinitis: Secondary | ICD-10-CM

## 2012-12-31 DIAGNOSIS — E039 Hypothyroidism, unspecified: Secondary | ICD-10-CM

## 2012-12-31 DIAGNOSIS — J019 Acute sinusitis, unspecified: Secondary | ICD-10-CM

## 2012-12-31 DIAGNOSIS — J309 Allergic rhinitis, unspecified: Secondary | ICD-10-CM

## 2012-12-31 LAB — TSH: TSH: 0.43 u[IU]/mL (ref 0.35–5.50)

## 2012-12-31 MED ORDER — TRAMADOL HCL 50 MG PO TABS: 100.0000 mg | ORAL_TABLET | Freq: Four times a day (QID) | ORAL | Status: DC | PRN

## 2012-12-31 MED ORDER — LEVOFLOXACIN 250 MG PO TABS: 250.0000 mg | ORAL_TABLET | Freq: Every day | ORAL | Status: DC

## 2012-12-31 NOTE — Progress Notes (Signed)
Subjective:    Patient ID: Vanessa [name], female    DOB: 1971/10/01, 42 y.o.   MRN: [id number]  HPI  Here to f/u, states had nosebleed yesterday, after using flonase more recent.  Asks for allergy referral. Does have several wks ongoing nasal allergy symptoms with clearish congestion, itch and sneezing, without fever, pain, ST, cough, swelling or wheezing   Denies hyper or hypo thyroid symptoms such as voice, skin or hair change.  Mother with recent defib placed.  Lost 7 lbs since lat visit with better diet, and more water.   Here with 2-3 days acute onset ? Low grade fever,  facial pain, pressure, headache, general weakness and malaise, and greenish d/c, with mod ST and cough, but pt denies chest pain, wheezing, increased sob or doe, orthopnea, PND, increased LE swelling, palpitations, dizziness or syncope. Also Pt continues to have recurring LBP mild to worsening with LE pain from left lower back to left foot after mistep and near fall approx  1 mo ago, just not getting better,  No bowel or bladder change, fever, wt loss,  worsening LE weakness but does have numbness, but no gait change or falls. No prior MRI for the back, or surgical eval.  Past Medical History  Diagnosis Date  . Seasonal allergies   . Asthma   . Hypertension   . Thyroid disease    Past Surgical History  Procedure Laterality Date  . Thyroid surgery    . Rotator cuff repair    . Carpal tunnel release      reports that she has never smoked. She has never used smokeless tobacco. She reports that she does not drink alcohol or use illicit drugs. family history includes Asthma in her mother; Heart disease in her mother; and Hypertension in her other. No Known Allergies Current Outpatient Prescriptions on File Prior to Visit  Medication Sig Dispense Refill  . albuterol (PROVENTIL HFA;VENTOLIN HFA) 108 (90 BASE) MCG/ACT inhaler Inhale 2 puffs into the lungs every 6 (six) hours as needed.  1 Inhaler  11  . beclomethasone (QVAR) 80  MCG/ACT inhaler Inhale 1 puff into the lungs as needed.  1 Inhaler  11  . cyclobenzaprine (FLEXERIL) 10 MG tablet Take 1 tablet (10 mg total) by mouth 3 (three) times daily as needed.  90 tablet  1  . ergocalciferol (VITAMIN D2) 50000 UNITS capsule Take 50,000 Units by mouth once a week.      . fluticasone (CUTIVATE) 0.05 % cream Apply topically 2 (two) times daily.  30 g  0  . levothyroxine (SYNTHROID, LEVOTHROID) 175 MCG tablet Take 1 tablet (175 mcg total) by mouth daily.  90 tablet  3  . loratadine (CLARITIN) 10 MG tablet Take 1 tablet (10 mg total) by mouth daily.  90 tablet  3  . losartan-hydrochlorothiazide (HYZAAR) 100-25 MG per tablet Take 1 tablet by mouth daily.  90 tablet  3   No current facility-administered medications on file prior to visit.   Review of Systems  Constitutional: Negative for unexpected weight change, or unusual diaphoresis  HENT: Negative for tinnitus.   Eyes: Negative for photophobia and visual disturbance.  Respiratory: Negative for choking and stridor.   Gastrointestinal: Negative for vomiting and blood in stool.  Genitourinary: Negative for hematuria and decreased urine volume.  Musculoskeletal: Negative for acute joint swelling Skin: Negative for color change and wound.  Neurological: Negative for tremors and numbness other than noted  Psychiatric/Behavioral: Negative for decreased concentration or  hyperactivity.  Objective:   Physical Exam BP 120/80  Pulse 95  Temp(Src) 97.4 F (36.3 C) (Oral)  Ht 5\' 5"  (1.651 m)  Wt 330 lb (149.687 kg)  BMI 54.91 kg/m2  SpO2 98% VS noted, mild ill Constitutional: Pt appears well-developed and well-nourished.  HENT: Head: NCAT.  Right Ear: External ear normal.  Left Ear: External ear normal.  Eyes: Conjunctivae and EOM are normal. Pupils are equal, round, and reactive to light.  Neck: Normal range of motion. Neck supple.  Bilat tm's with mild erythema.  Max sinus areas mild tender.  Pharynx with mild  erythema, no exudate Cardiovascular: Normal rate and regular rhythm.   Pulmonary/Chest: Effort normal and breath sounds normal.  Abd:  Soft, NT, non-distended, + BS Neurological: Pt is alert. Not confused , spine nontender, has some tender at the left SI joint area, motor/dtr LE's intact except for 4+ distal LLE weakness Skin: Skin is warm. No erythema. No rash Psychiatric: Pt behavior is normal. Thought content normal.     Assessment & Plan:

## 2012-12-31 NOTE — Patient Instructions (Addendum)
Please take all new medication as prescribed  - the antibiotic, and the pain medication You will be contacted regarding the referral for: MRI for the lower back, and orthopedic, as well as allergy Ok to stop the flonase since this might be causeing the nosebleeds If the nosebleeds recur, please call for ENT referral Please go to the LAB in the Basement (turn left off the elevator) for the tests to be done today (the thyroid test) You will be contacted by phone if any changes need to be made immediately.  Otherwise, you will receive a letter about your results with an explanation, but please check with MyChart first. Thank you for enrolling in MyChart. Please follow the instructions below to securely access your online medical record. MyChart allows you to send messages to your doctor, view your test results, renew your prescriptions, schedule appointments, and more. Please return in July, or sooner if needed, with Lab testing done 3-5 days before

## 2012-12-31 NOTE — Assessment & Plan Note (Addendum)
ECG reviewed as per emr, stable overall by history and exam, recent data reviewed with pt, and pt to continue medical treatment as before,  to f/u any worsening symptoms or concerns BP Readings from Last 3 Encounters:  12/31/12 120/80  11/09/12 116/72  09/29/12 150/89

## 2013-01-02 DIAGNOSIS — M545 Low back pain, unspecified: Secondary | ICD-10-CM | POA: Insufficient documentation

## 2013-01-02 DIAGNOSIS — J0100 Acute maxillary sinusitis, unspecified: Secondary | ICD-10-CM | POA: Insufficient documentation

## 2013-01-02 NOTE — Assessment & Plan Note (Signed)
To stop the flonase as may be related to recent nosebleeds, refer allergy per pt request

## 2013-01-02 NOTE — Assessment & Plan Note (Addendum)
?   Sciatica vs other, For pain control, also refer to ortho, will need MRI

## 2013-01-02 NOTE — Assessment & Plan Note (Signed)
Mild to mod, for antibx course,  to f/u any worsening symptoms or concerns 

## 2013-01-19 ENCOUNTER — Telehealth: Payer: Self-pay | Admitting: Internal Medicine

## 2013-01-19 NOTE — Telephone Encounter (Signed)
Requesting a refill on her antibiotic.  More congested.  Sneezing.  Clairtin is not helping.  Pharmacy is [facility].

## 2013-01-20 MED ORDER — LEVOFLOXACIN 250 MG PO TABS: 250.0000 mg | ORAL_TABLET | Freq: Every day | ORAL | Status: DC

## 2013-01-20 NOTE — Telephone Encounter (Signed)
Patient informed. 

## 2013-01-20 NOTE — Telephone Encounter (Signed)
Ok for 7 more days - done erx

## 2013-01-20 NOTE — Telephone Encounter (Signed)
Called the patient unable to leave message and no answer 

## 2013-01-21 NOTE — Telephone Encounter (Signed)
Called the patient to inform.  There was no answer and unable to leave a message

## 2013-01-21 NOTE — Telephone Encounter (Signed)
Called the patient informed antibiotic sent in.

## 2013-01-28 ENCOUNTER — Ambulatory Visit
Admission: RE | Admit: 2013-01-28 | Discharge: 2013-01-28 | Disposition: A | Discharge status: Home / Self Care | Payer: 59 | Source: Ambulatory Visit | Attending: Internal Medicine | Admitting: Internal Medicine

## 2013-01-28 DIAGNOSIS — M5416 Radiculopathy, lumbar region: Secondary | ICD-10-CM

## 2013-02-08 ENCOUNTER — Institutional Professional Consult (permissible substitution): Payer: Self-pay | Admitting: Internal Medicine

## 2013-02-25 ENCOUNTER — Other Ambulatory Visit: Payer: 59

## 2013-02-25 ENCOUNTER — Encounter: Payer: Self-pay | Admitting: Internal Medicine

## 2013-02-25 ENCOUNTER — Ambulatory Visit (INDEPENDENT_AMBULATORY_CARE_PROVIDER_SITE_OTHER): Payer: 59 | Admitting: Internal Medicine

## 2013-02-25 VITALS — BP 134/78 | HR 96 | Ht 65.5 in | Wt 331.0 lb

## 2013-02-25 DIAGNOSIS — J302 Other seasonal allergic rhinitis: Secondary | ICD-10-CM

## 2013-02-25 DIAGNOSIS — J309 Allergic rhinitis, unspecified: Secondary | ICD-10-CM

## 2013-02-25 DIAGNOSIS — J45909 Unspecified asthma, uncomplicated: Secondary | ICD-10-CM

## 2013-02-25 MED ORDER — PHENYLEPHRINE HCL 1 % NA SOLN: 3.0000 [drp] | Freq: Once | NASAL | Status: DC

## 2013-02-25 MED ORDER — AZELASTINE-FLUTICASONE 137-50 MCG/ACT NA SUSP: 2.0000 | Freq: Every day | NASAL | Status: DC

## 2013-02-25 MED ORDER — METHYLPREDNISOLONE ACETATE 80 MG/ML IJ SUSP: 80.0000 mg | Freq: Once | INTRAMUSCULAR | Status: DC

## 2013-02-25 NOTE — Patient Instructions (Addendum)
Order- lab- allergy profile   Dx Allergic rhinitis, asthma  Neb neo nasal  Depo 80  Sample Dymista nasal spray    1-2 puffs each nostril once daily at bedtime  Ok to continue Claritin, Allegra, Zyrtec or antihistamine of choice as needed  Ok to use your albuterol HFA rescue inhaler for asthma/ wheezing as needed

## 2013-02-25 NOTE — Progress Notes (Signed)
02/25/13- 42 yoF never smoker, Programmer, applications at [employer], referred courtesy of Dr Oliver Barre for allergy evaluation-no labs done; also note that patient had hard time with flu vaccine-trouble with breathing hours after injection Since childhood has had allergy complaints- itching eyes, nasal congestion, asthma.  Symptoms are perennial with triggers including pollens, cats, house dust, strong odors, cool air. Skin tested years ago and was on allergy vaccine in [place], [place]- did help. Has been using Claritin Flu vaccine said to cause tightness and burning in throat with shortness of breath every time she has gotten it Foods- some food colorings may cause itch. Hx Eczema on face occasionally. Insect sting- bee sting-local reaction, but throat may also feel tight. Flonase makes throat burn and feel short of breath. Environment- dust and cleansers at work. Lives in house, [facility], no pets, electric heat, no basement, no mold. Family- brother with bad allergies, father said to have died of asthma.  Prior to Admission medications   Medication Sig Start Date End Date Taking? Authorizing Provider  albuterol (PROVENTIL HFA;VENTOLIN HFA) 108 (90 BASE) MCG/ACT inhaler Inhale 2 puffs into the lungs every 6 (six) hours as needed. 03/18/12  Yes Corwin Levins, MD  beclomethasone (QVAR) 80 MCG/ACT inhaler Inhale 1 puff into the lungs as needed. 03/18/12  Yes Corwin Levins, MD  cyclobenzaprine (FLEXERIL) 10 MG tablet Take 1 tablet (10 mg total) by mouth 3 (three) times daily as needed. 09/23/12  Yes Corwin Levins, MD  ergocalciferol (VITAMIN D2) 50000 UNITS capsule Take 50,000 Units by mouth 3 (three) times a week.    Yes Historical Provider, MD  HYDROcodone-acetaminophen (NORCO) 10-325 MG per tablet Take 1 tablet by mouth every 6 (six) hours as needed for pain.   Yes Historical Provider, MD  levothyroxine (SYNTHROID, LEVOTHROID) 175 MCG tablet Take 1 tablet (175 mcg total) by mouth daily. 11/09/12  Yes Corwin Levins, MD   loratadine (CLARITIN) 10 MG tablet Take 1 tablet (10 mg total) by mouth daily. 11/09/12  Yes Corwin Levins, MD  losartan-hydrochlorothiazide (HYZAAR) 100-25 MG per tablet Take 1 tablet by mouth daily. 06/23/12  Yes Corwin Levins, MD  traMADol (ULTRAM) 50 MG tablet Take 2 tablets (100 mg total) by mouth every 6 (six) hours as needed for pain. 12/31/12  Yes Corwin Levins, MD  Azelastine-Fluticasone [facility]) 137-50 MCG/ACT SUSP Place 2 sprays into both nostrils at bedtime. 02/25/13   Waymon Budge, MD  fluticasone (CUTIVATE) 0.05 % cream Apply topically 2 (two) times daily. 06/09/12 06/09/13  Linna Hoff, MD   Past Medical History  Diagnosis Date  . Seasonal allergies   . Asthma   . Hypertension   . Thyroid disease    Past Surgical History  Procedure Laterality Date  . Thyroid surgery    . Rotator cuff repair    . Carpal tunnel release     Family History  Problem Relation Age of Onset  . Hypertension Other     both side of family  . Asthma Mother   . Heart disease Mother    History   Social History  . Marital Status: Single    Spouse Name: N/A    Number of Children: 1  . Years of Education: N/A   Occupational History  . house keeper [employer]   Social History Main Topics  . Smoking status: Never Smoker   . Smokeless tobacco: Never Used  . Alcohol Use: No  . Drug Use: No  .  Sexually Active: Not on file   Other Topics Concern  . Not on file   Social History Narrative  . No narrative on file   ROS-see HPI Constitutional:   No-   weight loss, night sweats, fevers, chills, fatigue, lassitude. HEENT:   No-  headaches, difficulty swallowing, tooth/dental problems, sore throat,       + sneezing, itching, ear ache, nasal congestion, post nasal drip,  CV:  No-   chest pain, orthopnea, PND, swelling in lower extremities, anasarca,                                  dizziness, palpitations Resp: No-   shortness of breath with exertion or at rest.              No-   productive  cough,  No non-productive cough,  No- coughing up of blood.              No-   change in color of mucus.  +wheezing.   Skin: No-   rash or lesions. GI:  No-   heartburn, indigestion, abdominal pain, nausea, vomiting, diarrhea,                 change in bowel habits, loss of appetite GU: No-   dysuria, change in color of urine, no urgency or frequency.  No- flank pain. MS:  No-   joint pain or swelling.  No- decreased range of motion.  No- back pain. Neuro-     nothing unusual Psych:  No- change in mood or affect. No depression or anxiety.  No memory loss.  OBJ- Physical Exam General- Alert, Oriented, Affect-appropriate, Distress- none acute. Obese. Skin- rash-none, lesions- none, excoriation- none Lymphadenopathy- none Head- atraumatic            Eyes- Gross vision intact, PERRLA, conjunctivae and secretions clear            Ears- Hearing, canals-normal            Nose- +snorting and sniffing, no-Septal dev, mucus, polyps, erosion, perforation             Throat- Mallampati II , mucosa clear , drainage- none, tonsils+Large Neck- flexible , trachea midline, no stridor , thyroid nl, carotid no bruit Chest - symmetrical excursion , unlabored           Heart/CV- RRR , no murmur , no gallop  , no rub, nl s1 s2                           - JVD- none , edema- none, stasis changes- none, varices- none           Lung- clear to P&A, wheeze- none, cough+ light , dullness-none, rub- none           Chest wall-  Abd-  Br/ Gen/ Rectal- Not done, not indicated Extrem- cyanosis- none, clubbing, none, atrophy- none, strength- nl Neuro- grossly intact to observation

## 2013-02-28 LAB — ALLERGY FULL PROFILE
Allergen, D pternoyssinus,d7: <0.1 kU/L
Bahia Grass: <0.1 kU/L
Box Elder IgE: <0.1 kU/L
Candida Albicans: <0.1 kU/L
Cat Dander: <0.1 kU/L
Common Ragweed: <0.1 kU/L
D. farinae: <0.1 kU/L
G009 Red Top: <0.1 kU/L
Goldenrod: <0.1 kU/L
Helminthosporium halodes: <0.1 kU/L
IgE (Immunoglobulin E), Serum: 6.8 IU/mL (ref 0.0–180.0)
Oak: <0.1 kU/L
Stemphylium Botryosum: <0.1 kU/L

## 2013-03-01 ENCOUNTER — Ambulatory Visit: Payer: 59 | Attending: Orthopaedic Surgery

## 2013-03-01 DIAGNOSIS — R293 Abnormal posture: Secondary | ICD-10-CM | POA: Insufficient documentation

## 2013-03-01 DIAGNOSIS — M6281 Muscle weakness (generalized): Secondary | ICD-10-CM | POA: Insufficient documentation

## 2013-03-01 DIAGNOSIS — M2560 Stiffness of unspecified joint, not elsewhere classified: Secondary | ICD-10-CM | POA: Insufficient documentation

## 2013-03-01 DIAGNOSIS — M2550 Pain in unspecified joint: Secondary | ICD-10-CM | POA: Insufficient documentation

## 2013-03-01 DIAGNOSIS — IMO0001 Reserved for inherently not codable concepts without codable children: Secondary | ICD-10-CM | POA: Insufficient documentation

## 2013-03-02 NOTE — Progress Notes (Signed)
Quick Note:  ATC both numbers-NA and unable to leave message. ______

## 2013-03-06 NOTE — Assessment & Plan Note (Signed)
History of positive allergy skin testing with benefit from allergy vaccine years ago. Atopy also indicated by sensitivity 2, and environmental allergens, probably some foods and to insect venom. Plan-allergy profile and return for allergy skin testing or cross reference and consideration of allergy vaccine therapy. Educated on environmental dust and pollen precautions including HEPA filters, masks, encasings for bedding.  Sample Dymista nasal spray. Nasal decongestant nebulizer treatment today with Depo-Medrol injection.

## 2013-03-06 NOTE — Assessment & Plan Note (Signed)
We discussed relationship to irritant and allergic triggers and appropriate use of her inhalers.

## 2013-03-11 ENCOUNTER — Ambulatory Visit: Payer: 59 | Attending: Orthopaedic Surgery

## 2013-03-11 DIAGNOSIS — R293 Abnormal posture: Secondary | ICD-10-CM | POA: Insufficient documentation

## 2013-03-11 DIAGNOSIS — M6281 Muscle weakness (generalized): Secondary | ICD-10-CM | POA: Insufficient documentation

## 2013-03-11 DIAGNOSIS — IMO0001 Reserved for inherently not codable concepts without codable children: Secondary | ICD-10-CM | POA: Insufficient documentation

## 2013-03-11 DIAGNOSIS — M2550 Pain in unspecified joint: Secondary | ICD-10-CM | POA: Insufficient documentation

## 2013-03-11 DIAGNOSIS — M2560 Stiffness of unspecified joint, not elsewhere classified: Secondary | ICD-10-CM | POA: Insufficient documentation

## 2013-03-18 ENCOUNTER — Encounter: Payer: Self-pay | Admitting: *Deleted

## 2013-03-18 NOTE — Progress Notes (Signed)
Quick Note:  Still unable to reach patient at either number and unable to leave message on either phone number. Will send letter to patient to call us for results. ______

## 2013-03-18 NOTE — Progress Notes (Signed)
Quick Note:  Pt called the office back today and I spoke with her about her results and next OV. No letter mailed. ______

## 2013-03-22 ENCOUNTER — Ambulatory Visit: Payer: 59

## 2013-03-25 ENCOUNTER — Ambulatory Visit: Payer: 59

## 2013-04-05 ENCOUNTER — Ambulatory Visit: Payer: 59 | Attending: Orthopaedic Surgery | Admitting: Physical Therapy

## 2013-04-05 DIAGNOSIS — M2550 Pain in unspecified joint: Secondary | ICD-10-CM | POA: Insufficient documentation

## 2013-04-05 DIAGNOSIS — R293 Abnormal posture: Secondary | ICD-10-CM | POA: Insufficient documentation

## 2013-04-05 DIAGNOSIS — M2560 Stiffness of unspecified joint, not elsewhere classified: Secondary | ICD-10-CM | POA: Insufficient documentation

## 2013-04-05 DIAGNOSIS — M6281 Muscle weakness (generalized): Secondary | ICD-10-CM | POA: Insufficient documentation

## 2013-04-05 DIAGNOSIS — IMO0001 Reserved for inherently not codable concepts without codable children: Secondary | ICD-10-CM | POA: Insufficient documentation

## 2013-04-06 ENCOUNTER — Ambulatory Visit: Payer: 59 | Admitting: Rehabilitation

## 2013-04-12 ENCOUNTER — Ambulatory Visit: Payer: 59 | Admitting: Rehabilitation

## 2013-04-15 ENCOUNTER — Ambulatory Visit: Payer: 59 | Admitting: Internal Medicine

## 2013-04-19 ENCOUNTER — Ambulatory Visit: Payer: 59 | Admitting: Physical Therapy

## 2013-04-21 ENCOUNTER — Ambulatory Visit: Payer: 59 | Admitting: Rehabilitation

## 2013-05-02 ENCOUNTER — Ambulatory Visit: Payer: 59 | Admitting: Physical Therapy

## 2013-05-09 ENCOUNTER — Ambulatory Visit: Payer: 59 | Attending: Orthopaedic Surgery | Admitting: Rehabilitation

## 2013-05-09 DIAGNOSIS — M6281 Muscle weakness (generalized): Secondary | ICD-10-CM | POA: Insufficient documentation

## 2013-05-09 DIAGNOSIS — M2560 Stiffness of unspecified joint, not elsewhere classified: Secondary | ICD-10-CM | POA: Insufficient documentation

## 2013-05-09 DIAGNOSIS — M2550 Pain in unspecified joint: Secondary | ICD-10-CM | POA: Insufficient documentation

## 2013-05-09 DIAGNOSIS — R293 Abnormal posture: Secondary | ICD-10-CM | POA: Insufficient documentation

## 2013-05-09 DIAGNOSIS — IMO0001 Reserved for inherently not codable concepts without codable children: Secondary | ICD-10-CM | POA: Insufficient documentation

## 2013-05-10 ENCOUNTER — Ambulatory Visit (INDEPENDENT_AMBULATORY_CARE_PROVIDER_SITE_OTHER): Payer: 59 | Admitting: Internal Medicine

## 2013-05-10 ENCOUNTER — Encounter: Payer: Self-pay | Admitting: Internal Medicine

## 2013-05-10 VITALS — BP 122/80 | HR 75 | Temp 98.0°F | Ht 65.0 in | Wt 336.0 lb

## 2013-05-10 DIAGNOSIS — M519 Unspecified thoracic, thoracolumbar and lumbosacral intervertebral disc disorder: Secondary | ICD-10-CM

## 2013-05-10 DIAGNOSIS — J45909 Unspecified asthma, uncomplicated: Secondary | ICD-10-CM

## 2013-05-10 DIAGNOSIS — J45998 Other asthma: Secondary | ICD-10-CM

## 2013-05-10 DIAGNOSIS — Z0000 Encounter for general adult medical examination without abnormal findings: Secondary | ICD-10-CM

## 2013-05-10 DIAGNOSIS — E038 Other specified hypothyroidism: Secondary | ICD-10-CM

## 2013-05-10 DIAGNOSIS — I10 Essential (primary) hypertension: Secondary | ICD-10-CM

## 2013-05-10 HISTORY — DX: Unspecified thoracic, thoracolumbar and lumbosacral intervertebral disc disorder: M51.9

## 2013-05-10 MED ORDER — LEVOTHYROXINE SODIUM 175 MCG PO TABS: 175.0000 ug | ORAL_TABLET | Freq: Every day | ORAL | Status: DC

## 2013-05-10 MED ORDER — FLUTICASONE PROPIONATE 0.05 % EX CREA: TOPICAL_CREAM | Freq: Two times a day (BID) | CUTANEOUS | Status: DC

## 2013-05-10 MED ORDER — CYCLOBENZAPRINE HCL 10 MG PO TABS: 10.0000 mg | ORAL_TABLET | Freq: Three times a day (TID) | ORAL | Status: DC | PRN

## 2013-05-10 NOTE — Progress Notes (Signed)
Subjective:    Patient ID: Vanessa [name], female    DOB: 07/13/1971, 42 y.o.   MRN: [id number]  HPI  Here to f/u; overall doing ok,  Pt denies chest pain, increased sob or doe, wheezing, orthopnea, PND, increased LE swelling, palpitations, dizziness or syncope.  Pt denies polydipsia, polyuria, or low sugar symptoms such as weakness or confusion improved with po intake.  Pt denies new neurological symptoms such as new headache, or facial or extremity weakness or numbness.   Pt states overall good compliance with meds, has been trying to follow lower cholesterol, diabetic diet, with wt overall stable.  Pt continues to have recurring LBP without change in severity, bowel or bladder change, fever, wt loss,  worsening LE pain/numbness/weakness, gait change or falls, asks for FMLA to be filled out, but has not gotten this from her place of employment yet.   Pt denies fever, wt loss, night sweats, loss of appetite, or other constitutional symptoms. Denies hyper or hypo thyroid symptoms such as voice, skin or hair change.  Past Medical History  Diagnosis Date  . Seasonal allergies   . Asthma   . Hypertension   . Thyroid disease   . Lumbar disc disease 05/10/2013   Past Surgical History  Procedure Laterality Date  . Thyroid surgery    . Rotator cuff repair    . Carpal tunnel release      reports that she has never smoked. She has never used smokeless tobacco. She reports that she does not drink alcohol or use illicit drugs. family history includes Asthma in her mother; Heart disease in her mother; and Hypertension in her other. No Known Allergies Current Outpatient Prescriptions on File Prior to Visit  Medication Sig Dispense Refill  . albuterol (PROVENTIL HFA;VENTOLIN HFA) 108 (90 BASE) MCG/ACT inhaler Inhale 2 puffs into the lungs every 6 (six) hours as needed.  1 Inhaler  11  . Azelastine-Fluticasone (DYMISTA) 137-50 MCG/ACT SUSP Place 2 sprays into both nostrils at bedtime.  1 Bottle  0  .  beclomethasone (QVAR) 80 MCG/ACT inhaler Inhale 1 puff into the lungs as needed.  1 Inhaler  11  . ergocalciferol (VITAMIN D2) 50000 UNITS capsule Take 50,000 Units by mouth 3 (three) times a week.       Marland Kitchen HYDROcodone-acetaminophen (NORCO) 10-325 MG per tablet Take 1 tablet by mouth every 6 (six) hours as needed for pain.      Marland Kitchen loratadine (CLARITIN) 10 MG tablet Take 1 tablet (10 mg total) by mouth daily.  90 tablet  3  . losartan-hydrochlorothiazide (HYZAAR) 100-25 MG per tablet Take 1 tablet by mouth daily.  90 tablet  3  . traMADol (ULTRAM) 50 MG tablet Take 2 tablets (100 mg total) by mouth every 6 (six) hours as needed for pain.  60 tablet  1   No current facility-administered medications on file prior to visit.   Review of Systems  Constitutional: Negative for unexpected weight change, or unusual diaphoresis  HENT: Negative for tinnitus.   Eyes: Negative for photophobia and visual disturbance.  Respiratory: Negative for choking and stridor.   Gastrointestinal: Negative for vomiting and blood in stool.  Genitourinary: Negative for hematuria and decreased urine volume.  Musculoskeletal: Negative for acute joint swelling Skin: Negative for color change and wound.  Neurological: Negative for tremors and numbness other than noted  Psychiatric/Behavioral: Negative for decreased concentration or  hyperactivity.       Objective:   Physical Exam BP 122/80  Pulse 75  Temp(Src)  98 F (36.7 C) (Oral)  Ht 5\' 5"  (1.651 m)  Wt 336 lb (152.409 kg)  BMI 55.91 kg/m2  SpO2 98% VS noted,  Constitutional: Pt appears well-developed and well-nourished.  HENT: Head: NCAT.  Right Ear: External ear normal.  Left Ear: External ear normal.  Eyes: Conjunctivae and EOM are normal. Pupils are equal, round, and reactive to light.  Neck: Normal range of motion. Neck supple.  Cardiovascular: Normal rate and regular rhythm.   Pulmonary/Chest: Effort normal and breath sounds normal. - no rales or  wheezing Abd:  Soft, NT, non-distended, + BS Spine: nontender Neurological: Pt is alert. Not confused  Skin: Skin is warm. No erythema.  Psychiatric: Pt behavior is normal. Thought content normal.     Assessment & Plan:

## 2013-05-10 NOTE — Patient Instructions (Signed)
Please continue all other medications as before, and refills have been done if requested. Please have the pharmacy call with any other refills you may need. Please continue your efforts at being more active, low cholesterol diet, and weight control. Please keep your appointments with your specialists as you have planned  Please return in 6 months, or sooner if needed, with Lab testing done 3-5 days before

## 2013-05-11 NOTE — Assessment & Plan Note (Signed)
stable overall by history and exam, recent data reviewed with pt, and pt to continue medical treatment as before,  to f/u any worsening symptoms or concerns SpO2 Readings from Last 3 Encounters:  05/10/13 98%  02/25/13 94%  12/31/12 98%

## 2013-05-11 NOTE — Assessment & Plan Note (Signed)
Exam stable overall by history and exam, and pt to continue medical treatment as before,  to f/u any worsening symptoms or concerns

## 2013-05-11 NOTE — Assessment & Plan Note (Signed)
stable overall by history and exam, recent data reviewed with pt, and pt to continue medical treatment as before,  to f/u any worsening symptoms or concerns BP Readings from Last 3 Encounters:  05/10/13 122/80  02/25/13 134/78  12/31/12 120/80

## 2013-05-11 NOTE — Assessment & Plan Note (Signed)
stable overall by history and exam, recent data reviewed with pt, and pt to continue medical treatment as before,  to f/u any worsening symptoms or concerns Lab Results  Component Value Date   TSH 0.43 12/31/2012

## 2013-05-12 ENCOUNTER — Ambulatory Visit: Payer: 59

## 2013-05-16 ENCOUNTER — Ambulatory Visit: Payer: 59 | Admitting: Physical Therapy

## 2013-05-20 ENCOUNTER — Encounter: Payer: 59 | Admitting: Rehabilitation

## 2013-06-14 ENCOUNTER — Ambulatory Visit: Payer: 59 | Attending: Orthopaedic Surgery

## 2013-06-14 DIAGNOSIS — M2560 Stiffness of unspecified joint, not elsewhere classified: Secondary | ICD-10-CM | POA: Insufficient documentation

## 2013-06-14 DIAGNOSIS — M6281 Muscle weakness (generalized): Secondary | ICD-10-CM | POA: Insufficient documentation

## 2013-06-14 DIAGNOSIS — IMO0001 Reserved for inherently not codable concepts without codable children: Secondary | ICD-10-CM | POA: Insufficient documentation

## 2013-06-14 DIAGNOSIS — R293 Abnormal posture: Secondary | ICD-10-CM | POA: Insufficient documentation

## 2013-06-14 DIAGNOSIS — M2550 Pain in unspecified joint: Secondary | ICD-10-CM | POA: Insufficient documentation

## 2013-06-16 ENCOUNTER — Ambulatory Visit: Payer: 59 | Admitting: Physical Therapy

## 2013-06-21 ENCOUNTER — Ambulatory Visit: Payer: 59 | Admitting: Physical Therapy

## 2013-06-23 ENCOUNTER — Ambulatory Visit: Payer: 59 | Admitting: Physical Therapy

## 2013-06-28 ENCOUNTER — Ambulatory Visit: Payer: 59 | Admitting: Physical Therapy

## 2013-06-28 ENCOUNTER — Other Ambulatory Visit: Payer: Self-pay | Admitting: Internal Medicine

## 2013-06-28 ENCOUNTER — Other Ambulatory Visit: Payer: Self-pay

## 2013-06-28 MED ORDER — ERGOCALCIFEROL 1.25 MG (50000 UT) PO CAPS: 50000.0000 [IU] | ORAL_CAPSULE | ORAL | Status: DC

## 2013-07-06 ENCOUNTER — Ambulatory Visit: Payer: 59 | Attending: Orthopaedic Surgery | Admitting: Rehabilitation

## 2013-07-06 ENCOUNTER — Encounter: Payer: 59 | Admitting: Rehabilitation

## 2013-07-06 DIAGNOSIS — R293 Abnormal posture: Secondary | ICD-10-CM | POA: Insufficient documentation

## 2013-07-06 DIAGNOSIS — M2560 Stiffness of unspecified joint, not elsewhere classified: Secondary | ICD-10-CM | POA: Insufficient documentation

## 2013-07-06 DIAGNOSIS — M6281 Muscle weakness (generalized): Secondary | ICD-10-CM | POA: Insufficient documentation

## 2013-07-06 DIAGNOSIS — M2550 Pain in unspecified joint: Secondary | ICD-10-CM | POA: Insufficient documentation

## 2013-07-06 DIAGNOSIS — IMO0001 Reserved for inherently not codable concepts without codable children: Secondary | ICD-10-CM | POA: Insufficient documentation

## 2013-07-07 ENCOUNTER — Ambulatory Visit: Payer: 59 | Admitting: Physical Therapy

## 2013-07-08 ENCOUNTER — Ambulatory Visit: Payer: 59

## 2013-07-12 ENCOUNTER — Ambulatory Visit: Payer: 59 | Admitting: Internal Medicine

## 2013-07-19 ENCOUNTER — Ambulatory Visit (INDEPENDENT_AMBULATORY_CARE_PROVIDER_SITE_OTHER): Payer: 59 | Admitting: Internal Medicine

## 2013-07-19 ENCOUNTER — Encounter: Payer: Self-pay | Admitting: Internal Medicine

## 2013-07-19 VITALS — BP 132/70 | HR 94 | Temp 98.2°F | Ht 65.0 in | Wt 335.0 lb

## 2013-07-19 DIAGNOSIS — M5412 Radiculopathy, cervical region: Secondary | ICD-10-CM | POA: Insufficient documentation

## 2013-07-19 DIAGNOSIS — I10 Essential (primary) hypertension: Secondary | ICD-10-CM

## 2013-07-19 MED ORDER — PHENTERMINE HCL 37.5 MG PO TABS: 37.5000 mg | ORAL_TABLET | Freq: Every day | ORAL | Status: DC

## 2013-07-19 MED ORDER — PREDNISONE 10 MG PO TABS: ORAL_TABLET | ORAL | Status: DC

## 2013-07-19 MED ORDER — TRAMADOL HCL 50 MG PO TABS: 100.0000 mg | ORAL_TABLET | Freq: Four times a day (QID) | ORAL | Status: DC | PRN

## 2013-07-19 NOTE — Assessment & Plan Note (Signed)
For trial phentermine asd,  to f/u any worsening symptoms or concerns

## 2013-07-19 NOTE — Assessment & Plan Note (Signed)
stable overall by history and exam, recent data reviewed with pt, and pt to continue medical treatment as before,  to f/u any worsening symptoms or concerns BP Readings from Last 3 Encounters:  07/19/13 132/70  05/10/13 122/80  02/25/13 134/78

## 2013-07-19 NOTE — Assessment & Plan Note (Signed)
Mild acute, for MRI, pain control, consider ortho referral, also for predpack asd

## 2013-07-19 NOTE — Patient Instructions (Addendum)
Please remember to followup with your GYN for the yearly pap smear and/or mammogram as you do Please take all new medication as prescribed - the prednisone, and the phentermine for wt loss Please continue all other medications as before, and refills have been done if requested - the tramadol You will be contacted regarding the referral for: Open MRI If abnormal, you may need to return to see Dr Magnus Ivan for this  Please remember to sign up for My Chart if you have not done so, as this will be important to you in the future with finding out test results, communicating by private email, and scheduling acute appointments online when needed.  You are given the flu shot note today
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
## 2013-07-29 ENCOUNTER — Other Ambulatory Visit (INDEPENDENT_AMBULATORY_CARE_PROVIDER_SITE_OTHER): Payer: 59

## 2013-07-29 ENCOUNTER — Ambulatory Visit (INDEPENDENT_AMBULATORY_CARE_PROVIDER_SITE_OTHER): Payer: 59 | Admitting: Internal Medicine

## 2013-07-29 ENCOUNTER — Encounter: Payer: Self-pay | Admitting: Internal Medicine

## 2013-07-29 VITALS — BP 110/62 | HR 87 | Temp 98.1°F | Ht 65.0 in | Wt 335.0 lb

## 2013-07-29 DIAGNOSIS — E039 Hypothyroidism, unspecified: Secondary | ICD-10-CM

## 2013-07-29 DIAGNOSIS — Z0000 Encounter for general adult medical examination without abnormal findings: Secondary | ICD-10-CM

## 2013-07-29 DIAGNOSIS — G4710 Hypersomnia, unspecified: Secondary | ICD-10-CM

## 2013-07-29 DIAGNOSIS — M5412 Radiculopathy, cervical region: Secondary | ICD-10-CM

## 2013-07-29 HISTORY — DX: Hypothyroidism, unspecified: E03.9

## 2013-07-29 LAB — LIPID PANEL
Cholesterol: 166 mg/dL (ref 0–200)
LDL Cholesterol: 88 mg/dL (ref 0–99)
Total CHOL/HDL Ratio: 2

## 2013-07-29 LAB — CBC WITH DIFFERENTIAL/PLATELET
Basophils Relative: 0.5 % (ref 0.0–3.0)
Eosinophils Absolute: 0.1 10*3/uL (ref 0.0–0.7)
Hemoglobin: 12.4 g/dL (ref 12.0–15.0)
MCHC: 33.1 g/dL (ref 30.0–36.0)
MCV: 83.3 fl (ref 78.0–100.0)
Monocytes Absolute: 0.6 10*3/uL (ref 0.1–1.0)
Neutro Abs: 4.5 10*3/uL (ref 1.4–7.7)
Neutrophils Relative %: 58.8 % (ref 43.0–77.0)
Platelets: 294 10*3/uL (ref 150.0–400.0)
RBC: 4.5 Mil/uL (ref 3.87–5.11)

## 2013-07-29 LAB — BASIC METABOLIC PANEL
Chloride: 101 mEq/L (ref 96–112)
Creatinine, Ser: 0.9 mg/dL (ref 0.4–1.2)
Potassium: 3.8 mEq/L (ref 3.5–5.1)
Sodium: 137 mEq/L (ref 135–145)

## 2013-07-29 LAB — URINALYSIS, ROUTINE W REFLEX MICROSCOPIC
Bilirubin Urine: NEGATIVE
Ketones, ur: NEGATIVE
Leukocytes, UA: NEGATIVE
Nitrite: NEGATIVE
Specific Gravity, Urine: 1.025 (ref 1.000–1.030)
Total Protein, Urine: NEGATIVE
pH: 6 (ref 5.0–8.0)

## 2013-07-29 LAB — HEPATIC FUNCTION PANEL
ALT: 20 U/L (ref 0–35)
AST: 24 U/L (ref 0–37)
Alkaline Phosphatase: 67 U/L (ref 39–117)
Bilirubin, Direct: 0.1 mg/dL (ref 0.0–0.3)
Total Bilirubin: 0.6 mg/dL (ref 0.3–1.2)

## 2013-07-29 LAB — TSH: TSH: 0.58 u[IU]/mL (ref 0.35–5.50)

## 2013-07-29 MED ORDER — LEVOTHYROXINE SODIUM 175 MCG PO TABS: 175.0000 ug | ORAL_TABLET | Freq: Every day | ORAL | Status: DC

## 2013-07-29 NOTE — Progress Notes (Signed)
Subjective:    Patient ID: Vanessa [name], female    DOB: [birth year], 42 y.o.   MRN: [id number]  HPI  Here for wellness and f/u;  Overall doing ok;  Pt denies CP, worsening SOB, DOE, wheezing, orthopnea, PND, worsening LE edema, palpitations, dizziness or syncope.  Pt denies neurological change such as new headache, facial or extremity weakness.  Pt denies polydipsia, polyuria, or low sugar symptoms. Pt states overall good compliance with treatment and medications, good tolerability, and has been trying to follow lower cholesterol diet.  Pt denies worsening depressive symptoms, suicidal ideation or panic. No fever, night sweats, wt loss, loss of appetite, or other constitutional symptoms.  Pt states good ability with ADL's, has low fall risk, home safety reviewed and adequate, no other significant changes in hearing or vision, and only occasionally active with exercise.  To start phentermine soon.  For MRI in approx 1 wk, to f/u with Dr Magnus Ivan per pt if abnormal.  Has yet to be seen per pulm for hypersom due to her work schedule, asks to see Dr Vassie Loll next mo, on a tues or fri she has off. Past Medical History  Diagnosis Date  . Seasonal allergies   . Asthma   . Hypertension   . Thyroid disease   . Lumbar disc disease 05/10/2013   Past Surgical History  Procedure Laterality Date  . Thyroid surgery    . Rotator cuff repair    . Carpal tunnel release      reports that she has never smoked. She has never used smokeless tobacco. She reports that she does not drink alcohol or use illicit drugs. family history includes Asthma in her mother; Heart disease in her mother; Hypertension in her other. Allergies  Allergen Reactions  . Influenza Vaccines Shortness Of Breath   Current Outpatient Prescriptions on File Prior to Visit  Medication Sig Dispense Refill  . albuterol (PROVENTIL HFA;VENTOLIN HFA) 108 (90 BASE) MCG/ACT inhaler Inhale 2 puffs into the lungs every 6 (six) hours as needed.  1 Inhaler  11   . Azelastine-Fluticasone (DYMISTA) 137-50 MCG/ACT SUSP Place 2 sprays into both nostrils at bedtime.  1 Bottle  0  . beclomethasone (QVAR) 80 MCG/ACT inhaler Inhale 1 puff into the lungs as needed.  1 Inhaler  11  . cyclobenzaprine (FLEXERIL) 10 MG tablet Take 1 tablet (10 mg total) by mouth 3 (three) times daily as needed.  90 tablet  3  . ergocalciferol (VITAMIN D2) 50000 UNITS capsule Take 1 capsule (50,000 Units total) by mouth 3 (three) times a week.  36 capsule  6  . fluticasone (CUTIVATE) 0.05 % cream Apply topically 2 (two) times daily.  30 g  2  . HYDROcodone-acetaminophen (NORCO) 10-325 MG per tablet Take 1 tablet by mouth every 6 (six) hours as needed for pain.      Marland Kitchen loratadine (CLARITIN) 10 MG tablet Take 1 tablet (10 mg total) by mouth daily.  90 tablet  3  . losartan-hydrochlorothiazide (HYZAAR) 100-25 MG per tablet TAKE 1 TABLET BY MOUTH DAILY  30 tablet  11  . phentermine (ADIPEX-P) 37.5 MG tablet Take 1 tablet (37.5 mg total) by mouth daily before breakfast.  30 tablet  2  . traMADol (ULTRAM) 50 MG tablet Take 2 tablets (100 mg total) by mouth every 6 (six) hours as needed for pain.  60 tablet  1   No current facility-administered medications on file prior to visit.    Review of Systems Constitutional: Negative for diaphoresis, activity  change, appetite change or unexpected weight change.  HENT: Negative for hearing loss, ear pain, facial swelling, mouth sores and neck stiffness.   Eyes: Negative for pain, redness and visual disturbance.  Respiratory: Negative for shortness of breath and wheezing.   Cardiovascular: Negative for chest pain and palpitations.  Gastrointestinal: Negative for diarrhea, blood in stool, abdominal distention or other pain Genitourinary: Negative for hematuria, flank pain or change in urine volume.  Musculoskeletal: Negative for myalgias and joint swelling.  Skin: Negative for color change and wound.  Neurological: Negative for syncope and  numbness. other than noted Hematological: Negative for adenopathy.  Psychiatric/Behavioral: Negative for hallucinations, self-injury, decreased concentration and agitation.      Objective:   Physical Exam BP 110/62  Pulse 87  Temp(Src) 98.1 F (36.7 C) (Oral)  Ht 5\' 5"  (1.651 m)  Wt 335 lb (151.955 kg)  BMI 55.75 kg/m2  SpO2 97% VS noted,  Constitutional: Pt is oriented to person, place, and time./morbind obese Appears well-developed and well-nourished.  Head: Normocephalic and atraumatic.  Right Ear: External ear normal.  Left Ear: External ear normal.  Nose: Nose normal.  Mouth/Throat: Oropharynx is clear and moist.  Eyes: Conjunctivae and EOM are normal. Pupils are equal, round, and reactive to light.  Pharynx with tonsils apparently chronically 1+, small throat Neck: Normal range of motion. Neck supple. No JVD present. No tracheal deviation present.  Cardiovascular: Normal rate, regular rhythm, normal heart sounds and intact distal pulses.   Pulmonary/Chest: Effort normal and breath sounds normal.  Abdominal: Soft. Bowel sounds are normal. There is no tenderness. No HSM  Musculoskeletal: Normal range of motion. Exhibits no edema.  Lymphadenopathy:  Has no cervical adenopathy.  Neurological: Pt is alert and oriented to person, place, and time. Pt has normal reflexes. No cranial nerve deficit.  Skin: Skin is warm and dry. No rash noted.  Psychiatric:  Has  normal mood and affect. Behavior is normal.     Assessment & Plan:

## 2013-07-29 NOTE — Patient Instructions (Addendum)
Please remember to followup with your GYN for the yearly pap smear and/or mammogram (consider the pap at [facility], and mammogram at [facility] on [address]) Please continue your efforts at being more active, low cholesterol diet, and weight control. You are otherwise up to date with prevention measures today. Please go to the LAB in the Basement (turn left off the elevator) for the tests to be done today You will be contacted by phone if any changes need to be made immediately.  Otherwise, you will receive a letter about your results with an explanation, but please check with MyChart first. Please keep your appointments with your specialists as you have planned - the MRI We should consider seeing Dr Magnus Ivan if the MRI is abnormal You will be contacted regarding the referral for: Dr Vassie Loll next mo on a tues or fri  I do wonder if seeing ENT would be a good idea for ? Sleep apnea as well since you do seem to have enlarged tonsils Please continue all other medications as before, including starting the phentermine Your Synthroid was refilled today, but if the prescription needs to be changed, we will have to consider this after your thyroid test today  Please remember to sign up for My Chart if you have not done so, as this will be important to you in the future with finding out test results, communicating by private email, and scheduling acute appointments online when needed.  Please return in 1 year for your yearly visit, or sooner if needed, with Lab testing done 3-5 days before

## 2013-07-31 NOTE — Assessment & Plan Note (Signed)
stable overall by history and exam, recent data reviewed with pt, and pt to continue medical treatment as before,  to f/u any worsening symptoms or concerns Lab Results  Component Value Date   TSH 0.58 07/29/2013

## 2013-07-31 NOTE — Assessment & Plan Note (Signed)
For f/u Dr Magnus Ivan Gaylord Shih asplanned

## 2013-07-31 NOTE — Assessment & Plan Note (Signed)

## 2013-07-31 NOTE — Assessment & Plan Note (Signed)
For f/u soon with pulm as planned

## 2013-08-25 ENCOUNTER — Encounter: Payer: Self-pay | Admitting: Internal Medicine

## 2013-09-08 ENCOUNTER — Other Ambulatory Visit: Payer: Self-pay

## 2013-09-12 ENCOUNTER — Institutional Professional Consult (permissible substitution): Payer: 59 | Admitting: Pulmonary Disease

## 2013-09-13 ENCOUNTER — Encounter: Payer: Self-pay | Admitting: Pulmonary Disease

## 2013-10-11 ENCOUNTER — Encounter: Payer: Self-pay | Admitting: Internal Medicine

## 2013-10-11 ENCOUNTER — Ambulatory Visit (INDEPENDENT_AMBULATORY_CARE_PROVIDER_SITE_OTHER): Payer: 59 | Admitting: Internal Medicine

## 2013-10-11 VITALS — BP 108/67 | HR 80 | Temp 97.2°F | Ht 65.0 in | Wt 308.0 lb

## 2013-10-11 DIAGNOSIS — J209 Acute bronchitis, unspecified: Secondary | ICD-10-CM | POA: Insufficient documentation

## 2013-10-11 DIAGNOSIS — J45901 Unspecified asthma with (acute) exacerbation: Secondary | ICD-10-CM | POA: Insufficient documentation

## 2013-10-11 DIAGNOSIS — J302 Other seasonal allergic rhinitis: Secondary | ICD-10-CM

## 2013-10-11 DIAGNOSIS — J309 Allergic rhinitis, unspecified: Secondary | ICD-10-CM

## 2013-10-11 DIAGNOSIS — I10 Essential (primary) hypertension: Secondary | ICD-10-CM

## 2013-10-11 MED ORDER — HYDROCODONE-HOMATROPINE 5-1.5 MG/5ML PO SYRP: 5.0000 mL | ORAL_SOLUTION | Freq: Four times a day (QID) | ORAL | Status: DC | PRN

## 2013-10-11 MED ORDER — LEVOFLOXACIN 250 MG PO TABS: 250.0000 mg | ORAL_TABLET | Freq: Every day | ORAL | Status: DC

## 2013-10-11 MED ORDER — METHYLPREDNISOLONE ACETATE 80 MG/ML IJ SUSP
80.0000 mg | Freq: Once | INTRAMUSCULAR | Status: AC
  Administered 2013-10-11: 80 mg via INTRAMUSCULAR

## 2013-10-11 NOTE — Assessment & Plan Note (Signed)
Mild to mod, for depomedrol IM,  to f/u any worsening symptoms or concerns 

## 2013-10-11 NOTE — Assessment & Plan Note (Signed)
Mild to mod, for antibx course,  to f/u any worsening symptoms or concerns 

## 2013-10-11 NOTE — Progress Notes (Signed)
Subjective:    Patient ID: Vanessa [name], female    DOB: Dec 03, 1970, 42 y.o.   MRN: [id number]  HPI  Here with acute onset mild to mod 2-3 wks ST, HA, general weakness and malaise, with prod cough greenish sputum, but Pt denies chest pain, increased sob or doe, wheezing, orthopnea, PND, increased LE swelling, palpitations, dizziness or syncope, except for mild wheeze onset last pm.  Has been coughing nonstop for 2.5 wks. Does have several wks ongoing nasal allergy symptoms with clearish congestion, itch and sneezing, without fever, pain, but has been out of her meds.  Has los 30 lbs since early sept with better diet. Past Medical History  Diagnosis Date  . Seasonal allergies   . Asthma   . Hypertension   . Thyroid disease   . Lumbar disc disease 05/10/2013  . Unspecified hypothyroidism 07/29/2013   Past Surgical History  Procedure Laterality Date  . Thyroid surgery    . Rotator cuff repair    . Carpal tunnel release      reports that she has never smoked. She has never used smokeless tobacco. She reports that she does not drink alcohol or use illicit drugs. family history includes Asthma in her mother; Heart disease in her mother; Hypertension in her other. Allergies  Allergen Reactions  . Influenza Vaccines Shortness Of Breath   Current Outpatient Prescriptions on File Prior to Visit  Medication Sig Dispense Refill  . albuterol (PROVENTIL HFA;VENTOLIN HFA) 108 (90 BASE) MCG/ACT inhaler Inhale 2 puffs into the lungs every 6 (six) hours as needed.  1 Inhaler  11  . Azelastine-Fluticasone (DYMISTA) 137-50 MCG/ACT SUSP Place 2 sprays into both nostrils at bedtime.  1 Bottle  0  . beclomethasone (QVAR) 80 MCG/ACT inhaler Inhale 1 puff into the lungs as needed.  1 Inhaler  11  . cyclobenzaprine (FLEXERIL) 10 MG tablet Take 1 tablet (10 mg total) by mouth 3 (three) times daily as needed.  90 tablet  3  . ergocalciferol (VITAMIN D2) 50000 UNITS capsule Take 1 capsule (50,000 Units total) by  mouth 3 (three) times a week.  36 capsule  6  . fluticasone (CUTIVATE) 0.05 % cream Apply topically 2 (two) times daily.  30 g  2  . HYDROcodone-acetaminophen (NORCO) 10-325 MG per tablet Take 1 tablet by mouth every 6 (six) hours as needed for pain.      Marland Kitchen levothyroxine (SYNTHROID, LEVOTHROID) 175 MCG tablet Take 1 tablet (175 mcg total) by mouth daily.  90 tablet  3  . loratadine (CLARITIN) 10 MG tablet Take 1 tablet (10 mg total) by mouth daily.  90 tablet  3  . losartan-hydrochlorothiazide (HYZAAR) 100-25 MG per tablet TAKE 1 TABLET BY MOUTH DAILY  30 tablet  11  . traMADol (ULTRAM) 50 MG tablet Take 2 tablets (100 mg total) by mouth every 6 (six) hours as needed for pain.  60 tablet  1   No current facility-administered medications on file prior to visit.    Review of Systems  Constitutional: Negative for unexpected weight change, or unusual diaphoresis  HENT: Negative for tinnitus.   Eyes: Negative for photophobia and visual disturbance.  Respiratory: Negative for choking and stridor.   Gastrointestinal: Negative for vomiting and blood in stool.  Genitourinary: Negative for hematuria and decreased urine volume.  Musculoskeletal: Negative for acute joint swelling Skin: Negative for color change and wound.  Neurological: Negative for tremors and numbness other than noted  Psychiatric/Behavioral: Negative for decreased concentration or  hyperactivity.  Objective:   Physical Exam BP 108/67  Pulse 80  Temp(Src) 97.2 F (36.2 C) (Oral)  Ht 5\' 5"  (1.651 m)  Wt 308 lb (139.708 kg)  BMI 51.25 kg/m2  SpO2 95% VS noted, mild ill Constitutional: Pt appears well-developed and well-nourished.  HENT: Head: NCAT.  Right Ear: External ear normal.  Left Ear: External ear normal.  Eyes: Conjunctivae and EOM are normal. Pupils are equal, round, and reactive to light.  Neck: Normal range of motion. Neck supple.  Cardiovascular: Normal rate and regular rhythm.   Pulmonary/Chest: Effort  normal and breath sounds decreased, trace wheezes bilat.  Neurological: Pt is alert. Not confused  Skin: Skin is warm. No erythema.  Psychiatric: Pt behavior is normal. Thought content normal.      Assessment & Plan:

## 2013-10-11 NOTE — Progress Notes (Signed)
Pre-visit discussion using our clinic review tool. No additional management support is needed unless otherwise documented below in the visit note.  

## 2013-10-11 NOTE — Assessment & Plan Note (Signed)
For med restart,  to f/u any worsening symptoms or concerns  

## 2013-10-11 NOTE — Assessment & Plan Note (Signed)
stable overall by history and exam, recent data reviewed with pt, and pt to continue medical treatment as before,  to f/u any worsening symptoms or concerns BP Readings from Last 3 Encounters:  10/11/13 108/67  07/29/13 110/62  07/19/13 132/70

## 2013-10-11 NOTE — Patient Instructions (Signed)
You had the steroid shot today Please take all new medication as prescribed  - the antibiotic, and cough medicine Please continue all other medications as before, and refills have been done if requested. Please have the pharmacy call with any other refills you may need.  Keep up the good work with the wt loss!

## 2013-10-24 ENCOUNTER — Telehealth: Payer: Self-pay

## 2013-10-24 NOTE — Telephone Encounter (Signed)
The patient called hoping to get another rx for more antibiotics.  She states the first round did not treat all of her symptoms.

## 2013-10-25 MED ORDER — AZITHROMYCIN 250 MG PO TABS: ORAL_TABLET | ORAL | Status: DC

## 2013-10-25 NOTE — Telephone Encounter (Signed)
Patient informed. 

## 2013-10-25 NOTE — Telephone Encounter (Signed)
Done erx 

## 2013-11-11 ENCOUNTER — Ambulatory Visit: Payer: 59 | Admitting: Internal Medicine

## 2013-11-11 DIAGNOSIS — Z0289 Encounter for other administrative examinations: Secondary | ICD-10-CM

## 2013-11-22 ENCOUNTER — Ambulatory Visit: Payer: 59 | Admitting: Internal Medicine

## 2013-11-22 DIAGNOSIS — Z0289 Encounter for other administrative examinations: Secondary | ICD-10-CM

## 2014-02-28 ENCOUNTER — Telehealth: Payer: Self-pay

## 2014-02-28 MED ORDER — LOSARTAN POTASSIUM 100 MG PO TABS: 100.0000 mg | ORAL_TABLET | Freq: Every day | ORAL | Status: DC

## 2014-02-28 MED ORDER — HYDROCHLOROTHIAZIDE 25 MG PO TABS: 25.0000 mg | ORAL_TABLET | Freq: Every day | ORAL | Status: DC

## 2014-02-28 NOTE — Telephone Encounter (Signed)
Change of med done as requested

## 2014-02-28 NOTE — Telephone Encounter (Signed)
Patient is taking losartan/HCTZ 100/25  $4/month.  The patient wants to switch to losartan and HCTZ copay will be $0.  [facility]

## 2014-05-11 ENCOUNTER — Ambulatory Visit: Payer: Self-pay

## 2014-05-11 ENCOUNTER — Other Ambulatory Visit: Payer: Self-pay | Admitting: Occupational Medicine

## 2014-05-11 DIAGNOSIS — R52 Pain, unspecified: Secondary | ICD-10-CM

## 2014-05-29 ENCOUNTER — Encounter: Payer: Self-pay | Admitting: Family Medicine

## 2014-05-29 ENCOUNTER — Encounter: Payer: Self-pay | Admitting: Internal Medicine

## 2014-05-29 ENCOUNTER — Other Ambulatory Visit (INDEPENDENT_AMBULATORY_CARE_PROVIDER_SITE_OTHER): Payer: 59

## 2014-05-29 ENCOUNTER — Ambulatory Visit (INDEPENDENT_AMBULATORY_CARE_PROVIDER_SITE_OTHER): Payer: 59 | Admitting: Family Medicine

## 2014-05-29 ENCOUNTER — Ambulatory Visit (INDEPENDENT_AMBULATORY_CARE_PROVIDER_SITE_OTHER): Payer: 59 | Admitting: Internal Medicine

## 2014-05-29 VITALS — BP 130/82 | HR 91 | Temp 98.2°F | Wt 324.0 lb

## 2014-05-29 DIAGNOSIS — M7551 Bursitis of right shoulder: Secondary | ICD-10-CM

## 2014-05-29 DIAGNOSIS — M25519 Pain in unspecified shoulder: Secondary | ICD-10-CM

## 2014-05-29 DIAGNOSIS — M67919 Unspecified disorder of synovium and tendon, unspecified shoulder: Secondary | ICD-10-CM

## 2014-05-29 DIAGNOSIS — M7580 Other shoulder lesions, unspecified shoulder: Secondary | ICD-10-CM

## 2014-05-29 DIAGNOSIS — M25511 Pain in right shoulder: Secondary | ICD-10-CM

## 2014-05-29 DIAGNOSIS — M25819 Other specified joint disorders, unspecified shoulder: Secondary | ICD-10-CM

## 2014-05-29 DIAGNOSIS — M719 Bursopathy, unspecified: Secondary | ICD-10-CM

## 2014-05-29 DIAGNOSIS — M7541 Impingement syndrome of right shoulder: Secondary | ICD-10-CM

## 2014-05-29 DIAGNOSIS — M7550 Bursitis of unspecified shoulder: Secondary | ICD-10-CM | POA: Insufficient documentation

## 2014-05-29 DIAGNOSIS — Z0289 Encounter for other administrative examinations: Secondary | ICD-10-CM

## 2014-05-29 MED ORDER — DICLOFENAC SODIUM 1 % TD GEL: 2.0000 g | Freq: Four times a day (QID) | TRANSDERMAL | Status: DC

## 2014-05-29 NOTE — Patient Instructions (Signed)
If Voltaren gel is too expensive ;use an anti-inflammatory cream such as Aspercreme or Zostrix cream twice a day to the affected area as needed. In lieu of this warm moist compresses or  hot water bottle can be used. Do not apply ice . Consider glucosamine sulfate 1500 mg daily for joint symptoms. This will rehydrate the tendons & cartilages.

## 2014-05-29 NOTE — Progress Notes (Signed)
   Subjective:    Patient ID: Vanessa SawyersStacey [name], female    DOB: [birth year], 43 y.o.   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 She was using a mop & experienced acute posterior hyperextension of the right upper extremity. She was seen at employee health and placed on restricted duty. Restricted duty was not approved by her direct supervisor so she was given given excuse to be out of work through 7/9. Films at that time revealed no bony abnormalities  She was again seen at employee health and kept out of work through 7/23.  At that visit she was told that she was covered under [employer]. and  she would need to be cleared for work by her primary care doctor  She did have rotator cuff surgery on the right shoulder in [place] in [place].  She is on Flexeril as needed, tramadol, and a narcotic pain medicine for chronic back issues. She sees Dr. Rayburn MaBlackmon.    Review of Systems   She continues to have pain in the right shoulder with range of motion. She states she cannot afford to be off work. The constant pain is aggravated by abduction or abduction of the right upper extremity.  It is described as a soreness. There is no radiation down the shoulder area or down the arm.  She also denies any numbness or tingling in the right upper extremity       Objective:   Physical Exam    As per CDC Guidelines ,Epic documents morbid obesity as being present .   She has pain with passive or active rotation of the right shoulder. No definite crepitus is palpable  There is decreased elevation of the right shoulder. She cannot raise the extremity past 30 above shoulder level.  Strength of the upper extremities is normal to direct testing.  Deep tendon reflexes are 0 to-1/2+ and equal.   Eyes: No conjunctival inflammation or scleral icterus is present.  Heart:  Normal rate and regular rhythm. S1 and S2 normal without gallop,  murmur, click, rub or other extra sounds     Lungs:Chest clear to auscultation; no wheezes, rhonchi,rales ,or rubs present.No increased work of breathing.   Gait normal  Skin:Warm & dry.  Intact without suspicious lesions or rashes   Lymphatic: No lymphadenopathy is noted about the head, neck, axilla              Assessment & Plan:  #1right shoulder impingement syndrome #2 PMH of rotator cuff surgery See orders & AVS

## 2014-05-29 NOTE — Progress Notes (Signed)
Pre visit review using our clinic review tool, if applicable. No additional management support is needed unless otherwise documented below in the visit note. 

## 2014-05-29 NOTE — Progress Notes (Unsigned)
Tawana ScaleZach Naquisha Whitehair D.O. [facility] [address] [place], [place] [address] Phone: [phone] Subjective:    I'm seeing this patient by the request  of:    CC:   WGN:FAOZHYQMVHHPI:Subjective Vanessa SawyersStacey [name] is a 43 y.o. female coming in with complaint of ***     Past medical history, social, surgical and family history all reviewed in electronic medical record.   Review of Systems: No headache, visual changes, nausea, vomiting, diarrhea, constipation, dizziness, abdominal pain, skin rash, fevers, chills, night sweats, weight loss, swollen lymph nodes, body aches, joint swelling, muscle aches, chest pain, shortness of breath, mood changes.   Objective There were no vitals taken for this visit.  General: No apparent distress alert and oriented x3 mood and affect normal, dressed appropriately.  HEENT: Pupils equal, extraocular movements intact  Respiratory: Patient's speak in full sentences and does not appear short of breath  Cardiovascular: No lower extremity edema, non tender, no erythema  Skin: Warm dry intact with no signs of infection or rash on extremities or on axial skeleton.  Abdomen: Soft nontender  Neuro: Cranial nerves II through XII are intact, neurovascularly intact in all extremities with 2+ DTRs and 2+ pulses.  Lymph: No lymphadenopathy of posterior or anterior cervical chain or axillae bilaterally.  Gait normal with good balance and coordination.  MSK:  Non tender with full range of motion and good stability and symmetric strength and tone of shoulders, elbows, wrist, hip, knee and ankles bilaterally.   Shoulder: Right Inspection reveals no abnormalities, atrophy or asymmetry. Palpation is normal with no tenderness over AC joint or bicipital groove. ROM is full in all planes passively. Rotator cuff strength normal throughout. signs of impingement with positive Neer and Hawkin's tests, but negative empty can sign. Speeds and Yergason's tests normal. No labral pathology  noted with negative Obrien's, negative clunk and good stability. Normal scapular function observed. No painful arc and no drop arm sign. No apprehension sign  MSK US performed of: Right This study was ordered, performed, and interpreted by Terrilee FilesZach Darlinda Bellows D.O.  Shoulder:   Supraspinatus:  Appears normal on long and transverse views, Bursal bulge seen with shoulder abduction on impingement view. Infraspinatus:  Appears normal on long and transverse views. Significant increase in Doppler flow Subscapularis:  Appears normal on long and transverse views. Positive bursa Teres Minor:  Appears normal on long and transverse views. AC joint:  Capsule undistended, no geyser sign. Glenohumeral Joint:  Appears normal without effusion. Glenoid Labrum:  Intact without visualized tears. Biceps Tendon:  Appears normal on long and transverse views, no fraying of tendon, tendon located in intertubercular groove, no subluxation with shoulder internal or external rotation.  Impression: Subacromial bursitis  Procedure: Real-time Ultrasound Guided Injection of right glenohumeral joint Device: GE Logiq E  Ultrasound guided injection is preferred based studies that show increased duration, increased effect, greater accuracy, decreased procedural pain, increased response rate with ultrasound guided versus blind injection.  Verbal informed consent obtained.  Time-out conducted.  Noted no overlying erythema, induration, or other signs of local infection.  Skin prepped in a sterile fashion.  Local anesthesia: Topical Ethyl chloride.  With sterile technique and under real time ultrasound guidance:  Joint visualized.  23g 1  inch needle inserted posterior approach. Pictures taken for needle placement. Patient did have injection of 2 cc of 1% lidocaine, 2 cc of 0.5% Marcaine, and 1.0 cc of Kenalog 40 mg/dL. Completed without difficulty  Pain immediately resolved suggesting accurate placement of the medication.  Advised  to call if fevers/chills, erythema, induration, drainage, or persistent bleeding.  Images permanently stored and available for review in the ultrasound unit.  Impression: Technically successful ultrasound guided injection.     Impression and Recommendations:     This case required medical decision making of moderate complexity.

## 2014-05-29 NOTE — Assessment & Plan Note (Signed)
The patient did have what appeared to be more of a bursitis but does have degenerative changes of the rotator cuff. Due to the patient's body habitus it was hard to visualize the entire rotator cuff so a tear is still possible. Patient was given an injection today but did have increasing range of motion as well as decrease pain. Patient's did not want to be put on light duty he needed to return to work secondary to [employer] and financial reasoning. Patient was given a note at this time and we'll do a one-week trial. Patient will come back in one week for further evaluation and treatment.

## 2014-05-29 NOTE — Patient Instructions (Signed)
Very nice to meet you Ice 20 minutes 2 times daily. Usually after activity and before bed. Exercises 3 times a week.  Turmeric 500mg  twice daily Vitamin D 2000 IU daily.  See you in 1 weeks.

## 2014-05-29 NOTE — Progress Notes (Signed)
Vanessa ScaleZach [name] D.O. [facility] [address] [place], [place] [address] Phone: [phone] Subjective:    I'm seeing this patient by the request  of:  Hopper  CC: Right shoulder pain  BJY:NWGNFAOZHYHPI:Subjective Vanessa SawyersStacey [name] is a 43 y.o. female coming in with complaint of right shoulder pain. Patient unfortunately was working [employer] 2015. 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 Patient was seen by employee health previously. Patient reports she had been out of work for 2 weeks. Patient did have a Worker's Comp. case opened which was denied. Patient is here today because she needs to work and would like a note. Patient was seen by another provider and wanted further evaluation. Patient is continuing to have significant amount of pain and describes it as a dull aching sensation that is worse with overhead activity as well as repetitive movement. Patient does have a past medical history significant for right rotator cuff repair back in 2003. Patient denies any radiation down her arm and denies any numbness. States that the pain is waking her up at night. States the severity is 7/10.  Patient did have x-rays May 11, 2014 which did not show any bony abnormalities.    Past medical history, social, surgical and family history all reviewed in electronic medical record.   Review of Systems: No headache, visual changes, nausea, vomiting, diarrhea, constipation, dizziness, abdominal pain, skin rash, fevers, chills, night sweats, weight loss, swollen lymph nodes, body aches, joint swelling, muscle aches, chest pain, shortness of breath, mood changes.   Objective Blood pressure 130/82, pulse 91, temperature 98.2 F (36.8 C), temperature source Oral, weight 324 lb (146.965 kg), SpO2 97.00%.  General: No apparent distress alert and oriented x3 mood and affect normal, dressed  appropriately.  HEENT: Pupils equal, extraocular movements intact  Respiratory: Patient's speak in full sentences and does not appear short of breath  Cardiovascular: No lower extremity edema, non tender, no erythema  Skin: Warm dry intact with no signs of infection or rash on extremities or on axial skeleton.  Abdomen: Soft nontender  Neuro: Cranial nerves II through XII are intact, neurovascularly intact in all extremities with 2+ DTRs and 2+ pulses.  Lymph: No lymphadenopathy of posterior or anterior cervical chain or axillae bilaterally.  Gait normal with good balance and coordination.  MSK:  Non tender with full range of motion and good stability and symmetric strength and tone of  elbows, wrist, hip, knee and ankles bilaterally.  Shoulder: Right Inspection reveals no abnormalities, atrophy or asymmetry. Palpation is normal with no tenderness over AC joint or bicipital groove. ROM is full in all planes passively. Rotator cuff strength normal throughout. signs of impingement with positive Neer and Hawkin's tests, but negative empty can sign. Speeds and Yergason's tests normal. No labral pathology noted with negative Obrien's, negative clunk and good stability. Normal scapular function observed. No painful arc and no drop arm sign. No apprehension sign  MSK US performed of: Right This study was ordered, performed, and interpreted by Terrilee FilesZach [name] D.O. Difficult to assess secondary to patient's body habitus Shoulder:   Supraspinatus:  Tendon appears to have degenerative changes and previous surgical changes. Bursal bulge seen with shoulder abduction on impingement view. Infraspinatus:  Appears normal on long and transverse views. Significant increase in Doppler flow Subscapularis: Significant degenerative changes but no true tear appreciated. Positive bursa Teres Minor:  Appears normal  on long and transverse views. AC joint:  Capsule undistended, no geyser sign. Glenohumeral Joint:   Moderate osteoarthritic changes Glenoid Labrum:  Intact without visualized tears. Biceps Tendon:  Appears normal on long and transverse views, no fraying of tendon, tendon located in intertubercular groove, no subluxation with shoulder internal or external rotation.  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  Procedure: Real-time Ultrasound Guided Injection of right glenohumeral joint Device: GE Logiq E  Ultrasound guided injection is preferred based studies that show increased duration, increased effect, greater accuracy, decreased procedural pain, increased response rate with ultrasound guided versus blind injection.  Verbal informed consent obtained.  Time-out conducted.  Noted no overlying erythema, induration, or other signs of local infection.  Skin prepped in a sterile fashion.  Local anesthesia: Topical Ethyl chloride.  With sterile technique and under real time ultrasound guidance:  Joint visualized.  23g 1  inch needle inserted posterior approach. Pictures taken for needle placement. Patient did have injection of 2 cc of 1% lidocaine, 2 cc of 0.5% Marcaine, and 1.0 cc of Kenalog 40 mg/dL. Completed without difficulty  Pain immediately resolved suggesting accurate placement of the medication.  Advised to call if fevers/chills, erythema, induration, drainage, or persistent bleeding.  Images permanently stored and available for review in the ultrasound unit.  Impression: Technically successful ultrasound guided injection.     Impression and Recommendations:     This case required medical decision making of moderate complexity.

## 2014-05-29 NOTE — Progress Notes (Signed)
   Subjective:    Patient ID: Vanessa [name], female    DOB: 1971-06-06, 43 y.o.   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 She was using a mop and experienced a posterior hyperextension of her right arm. She was seen that day by employee health. Per pt report, the doctor at employee health recommended restricted duty; this was not approved by her boss so she was given an excuse to be off of work. Pt was given a work excuse through 05/11/14 at which time she returned to employee health and was kept out of work throuhg 05/25/14 . On 05/25/14 f/u she was told she was denied worker's comp and needed to be cleared for work by her primary care doctor. Today she would like a note to go back to work.   Today the pt is still having R shoulder pain but states she can't afford to be out of work. She is still having constant pain that is worse with abduction and adduction. The pain is described as a soreness. She has no radiation up her neck or into her arm. There is no numbness or tingling of her RUE. The pthas had rotator cuff surgery on her R shoulder in 2003. She is currently taking flexeril PRN, tramadol, and norco for "back issues."   Review of Systems  Musculoskeletal: Positive for joint swelling. Negative for neck pain and neck stiffness.  Skin: Negative for color change and rash.  Neurological: Negative for weakness and numbness.      Objective:   Physical Exam        Assessment & Plan:

## 2014-06-09 ENCOUNTER — Ambulatory Visit (INDEPENDENT_AMBULATORY_CARE_PROVIDER_SITE_OTHER): Payer: 59 | Admitting: Internal Medicine

## 2014-06-09 ENCOUNTER — Other Ambulatory Visit (INDEPENDENT_AMBULATORY_CARE_PROVIDER_SITE_OTHER): Payer: 59

## 2014-06-09 ENCOUNTER — Encounter: Payer: Self-pay | Admitting: Internal Medicine

## 2014-06-09 ENCOUNTER — Telehealth: Payer: Self-pay | Admitting: Internal Medicine

## 2014-06-09 VITALS — BP 120/82 | HR 91 | Temp 98.5°F | Wt 319.0 lb

## 2014-06-09 DIAGNOSIS — N926 Irregular menstruation, unspecified: Secondary | ICD-10-CM

## 2014-06-09 DIAGNOSIS — Z0000 Encounter for general adult medical examination without abnormal findings: Secondary | ICD-10-CM

## 2014-06-09 DIAGNOSIS — M25519 Pain in unspecified shoulder: Secondary | ICD-10-CM

## 2014-06-09 DIAGNOSIS — J069 Acute upper respiratory infection, unspecified: Secondary | ICD-10-CM | POA: Insufficient documentation

## 2014-06-09 DIAGNOSIS — I10 Essential (primary) hypertension: Secondary | ICD-10-CM

## 2014-06-09 DIAGNOSIS — N939 Abnormal uterine and vaginal bleeding, unspecified: Secondary | ICD-10-CM

## 2014-06-09 DIAGNOSIS — M25511 Pain in right shoulder: Secondary | ICD-10-CM | POA: Insufficient documentation

## 2014-06-09 LAB — CBC WITH DIFFERENTIAL/PLATELET
Basophils Absolute: 0.1 10*3/uL (ref 0.0–0.1)
Basophils Relative: 0.4 % (ref 0.0–3.0)
EOS ABS: 0.1 10*3/uL (ref 0.0–0.7)
EOS PCT: 1 % (ref 0.0–5.0)
HEMATOCRIT: 39.9 % (ref 36.0–46.0)
Hemoglobin: 13 g/dL (ref 12.0–15.0)
LYMPHS ABS: 3.4 10*3/uL (ref 0.7–4.0)
Lymphocytes Relative: 22.3 % (ref 12.0–46.0)
MCHC: 32.6 g/dL (ref 30.0–36.0)
MCV: 85.6 fl (ref 78.0–100.0)
Monocytes Absolute: 0.9 10*3/uL (ref 0.1–1.0)
Monocytes Relative: 5.7 % (ref 3.0–12.0)
Neutro Abs: 10.6 10*3/uL — ABNORMAL HIGH (ref 1.4–7.7)
Neutrophils Relative %: 70.6 % (ref 43.0–77.0)
Platelets: 338 10*3/uL (ref 150.0–400.0)
RBC: 4.66 Mil/uL (ref 3.87–5.11)
RDW: 18.4 % — ABNORMAL HIGH (ref 11.5–15.5)
WBC: 15.1 10*3/uL — AB (ref 4.0–10.5)

## 2014-06-09 LAB — URINALYSIS, ROUTINE W REFLEX MICROSCOPIC
BILIRUBIN URINE: NEGATIVE
KETONES UR: NEGATIVE
Leukocytes, UA: NEGATIVE
Nitrite: NEGATIVE
PH: 6 (ref 5.0–8.0)
Specific Gravity, Urine: 1.02 (ref 1.000–1.030)
TOTAL PROTEIN, URINE-UPE24: NEGATIVE
Urine Glucose: NEGATIVE
Urobilinogen, UA: 0.2 (ref 0.0–1.0)
WBC, UA: NONE SEEN (ref 0–?)

## 2014-06-09 LAB — BASIC METABOLIC PANEL
BUN: 16 mg/dL (ref 6–23)
CHLORIDE: 100 meq/L (ref 96–112)
CO2: 30 mEq/L (ref 19–32)
Calcium: 9 mg/dL (ref 8.4–10.5)
Creatinine, Ser: 1.1 mg/dL (ref 0.4–1.2)
GFR: 69.92 mL/min (ref >60.00)
Glucose, Bld: 88 mg/dL (ref 70–99)
POTASSIUM: 4 meq/L (ref 3.5–5.1)
SODIUM: 137 meq/L (ref 135–145)

## 2014-06-09 LAB — LIPID PANEL
Cholesterol: 214 mg/dL — ABNORMAL HIGH (ref 0–200)
HDL: 78 mg/dL (ref >39.00)
LDL CALC: 113 mg/dL — AB (ref 0–99)
NONHDL: 136
TRIGLYCERIDES: 114 mg/dL (ref 0.0–149.0)
Total CHOL/HDL Ratio: 3
VLDL: 22.8 mg/dL (ref 0.0–40.0)

## 2014-06-09 LAB — HEPATIC FUNCTION PANEL
ALK PHOS: 92 U/L (ref 39–117)
ALT: 19 U/L (ref 0–35)
AST: 18 U/L (ref 0–37)
Albumin: 3.3 g/dL — ABNORMAL LOW (ref 3.5–5.2)
BILIRUBIN TOTAL: 0.3 mg/dL (ref 0.2–1.2)
Bilirubin, Direct: 0 mg/dL (ref 0.0–0.3)
Total Protein: 8.3 g/dL (ref 6.0–8.3)

## 2014-06-09 LAB — TSH: TSH: 2.46 u[IU]/mL (ref 0.35–4.50)

## 2014-06-09 MED ORDER — AZITHROMYCIN 250 MG PO TABS: ORAL_TABLET | ORAL | Status: DC

## 2014-06-09 MED ORDER — TRAMADOL HCL 50 MG PO TABS: 50.0000 mg | ORAL_TABLET | Freq: Four times a day (QID) | ORAL | Status: DC | PRN

## 2014-06-09 MED ORDER — PROGESTERONE MICRONIZED 100 MG PO CAPS: ORAL_CAPSULE | ORAL | Status: DC

## 2014-06-09 NOTE — Telephone Encounter (Signed)
Called pt; has elev wbc, did c/o ST possibly improving at her OV, exam without exudate but  OK for zpack - done erx, pt notified

## 2014-06-09 NOTE — Assessment & Plan Note (Signed)
With recent exam per sports med, has 2 tears in shoulder, ok for referral to Dr Lanny CrampBlackman/ortho, and pain control, may need to consider MRI

## 2014-06-09 NOTE — Progress Notes (Signed)
Subjective:    Patient ID: Vanessa [name], female    DOB: May 26, 1971, 43 y.o.   MRN: [id number]  HPI   Here for wellness and f/u;  Overall doing ok;  Pt denies CP, worsening SOB, DOE, wheezing, orthopnea, PND, worsening LE edema, palpitations, dizziness or syncope.  Pt denies neurological change such as new headache, facial or extremity weakness.  Pt denies polydipsia, polyuria, or low sugar symptoms. Pt states overall good compliance with treatment and medications, good tolerability, and has been trying to follow lower cholesterol diet.  Pt denies worsening depressive symptoms, suicidal ideation or panic. No fever, night sweats, wt loss, loss of appetite, or other constitutional symptoms.  Pt states good ability with ADL's, has low fall risk, home safety reviewed and adequate, no other significant changes in hearing or vision, and only occasionally active with exercise. Also Here after ? Work related right shoulder injury but declined per [employer]; saw MD here then Dr Katrinka BlazingSmith, with cortisone injection, not improved per pt, did not see dr Katrinka Blazingsmith at one wk (pt confused), asks for ortho referral and pain control.  Was out of work July 3 to July 27.  Also with 1 wk onset heavy menstrual bleeding, unsusual for her, onset with stress in past, tx with what sounds like short course prednisone.  Not pregnant.  Also -  Here with 2-3 days acute onset fever, facial pain, pressure, headache, general weakness and malaise, with mod ST and nonprod cough, without high f/c/n/v/d.   Past Medical History  Diagnosis Date  . Seasonal allergies   . Asthma   . Hypertension   . Thyroid disease   . Lumbar disc disease 05/10/2013  . Unspecified hypothyroidism 07/29/2013   Past Surgical History  Procedure Laterality Date  . Thyroid surgery    . Rotator cuff repair    . Carpal tunnel release      reports that she has never smoked. She has never used smokeless tobacco. She reports that she does not drink alcohol or use  illicit drugs. family history includes Asthma in her mother; Heart disease in her mother; Hypertension in her other. Allergies  Allergen Reactions  . Influenza Vaccines Shortness Of Breath   Current Outpatient Prescriptions on File Prior to Visit  Medication Sig Dispense Refill  . albuterol (PROVENTIL HFA;VENTOLIN HFA) 108 (90 BASE) MCG/ACT inhaler Inhale 2 puffs into the lungs every 6 (six) hours as needed.  1 Inhaler  11  . Azelastine-Fluticasone (DYMISTA) 137-50 MCG/ACT SUSP Place 2 sprays into both nostrils at bedtime.  1 Bottle  0  . beclomethasone (QVAR) 80 MCG/ACT inhaler Inhale 1 puff into the lungs as needed.  1 Inhaler  11  . cyclobenzaprine (FLEXERIL) 10 MG tablet Take 1 tablet (10 mg total) by mouth 3 (three) times daily as needed.  90 tablet  3  . diclofenac sodium (VOLTAREN) 1 % GEL Apply 2 g topically 4 (four) times daily.  100 g  0  . ergocalciferol (VITAMIN D2) 50000 UNITS capsule Take 1 capsule (50,000 Units total) by mouth 3 (three) times a week.  36 capsule  6  . hydrochlorothiazide (HYDRODIURIL) 25 MG tablet Take 1 tablet (25 mg total) by mouth daily.  90 tablet  3  . HYDROcodone-acetaminophen (NORCO) 10-325 MG per tablet Take 1 tablet by mouth every 6 (six) hours as needed for pain.      Marland Kitchen. levothyroxine (SYNTHROID, LEVOTHROID) 175 MCG tablet Take 1 tablet (175 mcg total) by mouth daily.  90 tablet  3  . loratadine (CLARITIN) 10 MG tablet Take 1 tablet (10 mg total) by mouth daily.  90 tablet  3  . losartan (COZAAR) 100 MG tablet Take 1 tablet (100 mg total) by mouth daily.  90 tablet  3   No current facility-administered medications on file prior to visit.   Review of Systems Constitutional: Negative for increased diaphoresis, other activity, appetite or other siginficant weight change  HENT: Negative for worsening hearing loss, ear pain, facial swelling, mouth sores and neck stiffness.   Eyes: Negative for other worsening pain, redness or visual disturbance.    Respiratory: Negative for shortness of breath and wheezing.   Cardiovascular: Negative for chest pain and palpitations.  Gastrointestinal: Negative for diarrhea, blood in stool, abdominal distention or other pain Genitourinary: Negative for hematuria, flank pain or change in urine volume.  Musculoskeletal: Negative for myalgias or other joint complaints.  Skin: Negative for color change and wound.  Neurological: Negative for syncope and numbness. other than noted Hematological: Negative for adenopathy. or other swelling Psychiatric/Behavioral: Negative for hallucinations, self-injury, decreased concentration or other worsening agitation.      Objective:   Physical Exam BP 120/82  Pulse 91  Temp(Src) 98.5 F (36.9 C) (Oral)  Wt 319 lb (144.697 kg)  SpO2 96% VS noted,  Constitutional: Pt is oriented to person, place, and time. Appears well-developed and well-nourished.  Head: Normocephalic and atraumatic.  Right Ear: External ear normal.  Left Ear: External ear normal.  Nose: Nose normal.  Mouth/Throat: Oropharynx is clear and moist.  Eyes: Conjunctivae and EOM are normal. Pupils are equal, round, and reactive to light.  Neck: Normal range of motion. Neck supple. No JVD present. No tracheal deviation present.  Bilat tm's with mild erythema.  Max sinus areas non tender.  Pharynx with mild erythema, no exudate Cardiovascular: Normal rate, regular rhythm, normal heart sounds and intact distal pulses.   Pulmonary/Chest: Effort normal and breath sounds without rales or wheezing  Abdominal: Soft. Bowel sounds are normal. NT. No HSM  Musculoskeletal: Normal range of motion. Exhibits no edema.  Lymphadenopathy:  Has no cervical adenopathy.  Neurological: Pt is alert and oriented to person, place, and time. Pt has normal reflexes. No cranial nerve deficit. Motor grossly intact Skin: Skin is warm and dry. No rash noted.  Psychiatric:  Has normal mood and affect. Behavior is normal.  Right  shoulder with decr ROM, tender subacromial    Assessment & Plan:

## 2014-06-09 NOTE — Patient Instructions (Addendum)
Please take all new medication as prescribed  - the progesterone, antibiotic  Please continue all other medications as before, and refills have been done if requested - the tramadol  Please have the pharmacy call with any other refills you may need.  Please continue your efforts at being more active, low cholesterol diet, and weight control.  You are otherwise up to date with prevention measures today.  Please keep your appointments with your specialists as you may have planned  You will be contacted regarding the referral for: Dr Magnus IvanBlackman, orthopedic  Please go to the LAB in the Basement (turn left off the elevator) for the tests to be done today  You will be contacted by phone if any changes need to be made immediately.  Otherwise, you will receive a letter about your results with an explanation, but please check with MyChart first.  Please remember to sign up for MyChart if you have not done so, as this will be important to you in the future with finding out test results, communicating by private email, and scheduling acute appointments online when needed.  Please return in 1 year for your yearly visit, or sooner if needed

## 2014-06-09 NOTE — Progress Notes (Signed)
Pre visit review using our clinic review tool, if applicable. No additional management support is needed unless otherwise documented below in the visit note. 

## 2014-06-10 DIAGNOSIS — N939 Abnormal uterine and vaginal bleeding, unspecified: Secondary | ICD-10-CM | POA: Insufficient documentation

## 2014-06-10 NOTE — Assessment & Plan Note (Signed)

## 2014-06-10 NOTE — Assessment & Plan Note (Signed)
Mild to mod, for antibx course,  to f/u any worsening symptoms or concerns 

## 2014-06-10 NOTE — Assessment & Plan Note (Signed)
stable overall by history and exam, recent data reviewed with pt, and pt to continue medical treatment as before,  to f/u any worsening symptoms or concerns BP Readings from Last 3 Encounters:  06/09/14 120/82  05/29/14 130/82  05/29/14 130/82

## 2014-06-10 NOTE — Assessment & Plan Note (Signed)
Ok for progestin x 1 wk, f/u GYn for recurrent bleeding

## 2014-06-19 ENCOUNTER — Other Ambulatory Visit: Payer: Self-pay | Admitting: Internal Medicine

## 2014-07-11 ENCOUNTER — Telehealth: Payer: Self-pay | Admitting: Internal Medicine

## 2014-07-11 MED ORDER — PROGESTERONE MICRONIZED 100 MG PO CAPS: ORAL_CAPSULE | ORAL | Status: DC

## 2014-07-11 NOTE — Telephone Encounter (Signed)
Ok for letter, as flu shot is listed as an allergy

## 2014-07-11 NOTE — Telephone Encounter (Signed)
The progesterone was printed and I think given her? On aug 7  Will send to [facility] outpt pharm

## 2014-07-11 NOTE — Telephone Encounter (Signed)
Patient states that she was suppose to have a script sent in to the pharmacy to stop bleeding but its not there.  Also, needs a letter stating she can not have a flu shot b/c of health.

## 2014-07-11 NOTE — Telephone Encounter (Signed)
Also the patient requested a letter stating she cannot have a flu shot because of her health?

## 2014-07-12 NOTE — Telephone Encounter (Signed)
Called the patient informed script at [facility] and mailed as she requested letter to her home

## 2014-07-27 ENCOUNTER — Other Ambulatory Visit: Payer: Self-pay | Admitting: Internal Medicine

## 2014-08-01 ENCOUNTER — Telehealth: Payer: Self-pay | Admitting: *Deleted

## 2014-08-01 NOTE — Telephone Encounter (Signed)
Left msg on triage stating received fax back on tramadol stating med was fill 06/09/14. We never received script for tramadol. Called pharmacy had to leave msg on pharmacy vm...Raechel Chute/lmb

## 2014-08-04 ENCOUNTER — Ambulatory Visit: Payer: Self-pay | Admitting: Internal Medicine

## 2014-08-04 ENCOUNTER — Emergency Department (HOSPITAL_COMMUNITY): Payer: 59

## 2014-08-04 ENCOUNTER — Emergency Department (HOSPITAL_COMMUNITY)
Admission: EM | Admit: 2014-08-04 | Discharge: 2014-08-04 | Disposition: A | Discharge status: Home / Self Care | Payer: 59 | Attending: Emergency Medicine | Admitting: Emergency Medicine

## 2014-08-04 ENCOUNTER — Encounter (HOSPITAL_COMMUNITY): Payer: Self-pay | Admitting: Radiology

## 2014-08-04 DIAGNOSIS — Y9389 Activity, other specified: Secondary | ICD-10-CM | POA: Diagnosis not present

## 2014-08-04 DIAGNOSIS — Y92410 Unspecified street and highway as the place of occurrence of the external cause: Secondary | ICD-10-CM | POA: Insufficient documentation

## 2014-08-04 DIAGNOSIS — E669 Obesity, unspecified: Secondary | ICD-10-CM | POA: Insufficient documentation

## 2014-08-04 DIAGNOSIS — N189 Chronic kidney disease, unspecified: Secondary | ICD-10-CM | POA: Diagnosis not present

## 2014-08-04 DIAGNOSIS — I129 Hypertensive chronic kidney disease with stage 1 through stage 4 chronic kidney disease, or unspecified chronic kidney disease: Secondary | ICD-10-CM | POA: Diagnosis not present

## 2014-08-04 DIAGNOSIS — J45909 Unspecified asthma, uncomplicated: Secondary | ICD-10-CM | POA: Insufficient documentation

## 2014-08-04 DIAGNOSIS — S3992XA Unspecified injury of lower back, initial encounter: Secondary | ICD-10-CM | POA: Diagnosis not present

## 2014-08-04 DIAGNOSIS — E079 Disorder of thyroid, unspecified: Secondary | ICD-10-CM | POA: Insufficient documentation

## 2014-08-04 DIAGNOSIS — Z79899 Other long term (current) drug therapy: Secondary | ICD-10-CM | POA: Insufficient documentation

## 2014-08-04 DIAGNOSIS — S4991XA Unspecified injury of right shoulder and upper arm, initial encounter: Secondary | ICD-10-CM | POA: Diagnosis present

## 2014-08-04 DIAGNOSIS — Z8739 Personal history of other diseases of the musculoskeletal system and connective tissue: Secondary | ICD-10-CM | POA: Insufficient documentation

## 2014-08-04 DIAGNOSIS — M25511 Pain in right shoulder: Secondary | ICD-10-CM

## 2014-08-04 DIAGNOSIS — E039 Hypothyroidism, unspecified: Secondary | ICD-10-CM | POA: Diagnosis not present

## 2014-08-04 DIAGNOSIS — S199XXA Unspecified injury of neck, initial encounter: Secondary | ICD-10-CM | POA: Diagnosis not present

## 2014-08-04 DIAGNOSIS — Z3202 Encounter for pregnancy test, result negative: Secondary | ICD-10-CM | POA: Insufficient documentation

## 2014-08-04 LAB — COMPREHENSIVE METABOLIC PANEL
ALK PHOS: 94 U/L (ref 39–117)
ALT: 21 U/L (ref 0–35)
AST: 27 U/L (ref 0–37)
Albumin: 3.1 g/dL — ABNORMAL LOW (ref 3.5–5.2)
Anion gap: 14 (ref 5–15)
BUN: 15 mg/dL (ref 6–23)
CO2: 24 mEq/L (ref 19–32)
CREATININE: 0.83 mg/dL (ref 0.50–1.10)
Calcium: 9.2 mg/dL (ref 8.4–10.5)
Chloride: 98 mEq/L (ref 96–112)
GFR calc non Af Amer: 86 mL/min — ABNORMAL LOW (ref >90)
GLUCOSE: 90 mg/dL (ref 70–99)
POTASSIUM: 3.9 meq/L (ref 3.7–5.3)
Sodium: 136 mEq/L — ABNORMAL LOW (ref 137–147)
TOTAL PROTEIN: 8.2 g/dL (ref 6.0–8.3)
Total Bilirubin: <0.2 mg/dL — ABNORMAL LOW (ref 0.3–1.2)

## 2014-08-04 LAB — LIPASE, BLOOD: LIPASE: 12 U/L (ref 11–59)

## 2014-08-04 LAB — CBC WITH DIFFERENTIAL/PLATELET
BASOS PCT: 0 % (ref 0–1)
Basophils Absolute: 0 10*3/uL (ref 0.0–0.1)
Eosinophils Absolute: 0.2 10*3/uL (ref 0.0–0.7)
Eosinophils Relative: 2 % (ref 0–5)
HEMATOCRIT: 37.4 % (ref 36.0–46.0)
HEMOGLOBIN: 12.5 g/dL (ref 12.0–15.0)
LYMPHS ABS: 3.5 10*3/uL (ref 0.7–4.0)
Lymphocytes Relative: 29 % (ref 12–46)
MCH: 28.2 pg (ref 26.0–34.0)
MCHC: 33.4 g/dL (ref 30.0–36.0)
MCV: 84.2 fL (ref 78.0–100.0)
MONO ABS: 0.6 10*3/uL (ref 0.1–1.0)
MONOS PCT: 5 % (ref 3–12)
NEUTROS PCT: 64 % (ref 43–77)
Neutro Abs: 7.7 10*3/uL (ref 1.7–7.7)
Platelets: 314 10*3/uL (ref 150–400)
RBC: 4.44 MIL/uL (ref 3.87–5.11)
RDW: 16.5 % — ABNORMAL HIGH (ref 11.5–15.5)
WBC: 12 10*3/uL — ABNORMAL HIGH (ref 4.0–10.5)

## 2014-08-04 LAB — PREGNANCY, URINE: Preg Test, Ur: NEGATIVE

## 2014-08-04 MED ORDER — FENTANYL CITRATE 0.05 MG/ML IJ SOLN: 50.0000 ug | Freq: Once | INTRAMUSCULAR | Status: DC

## 2014-08-04 MED ORDER — CYCLOBENZAPRINE HCL 10 MG PO TABS: 10.0000 mg | ORAL_TABLET | Freq: Two times a day (BID) | ORAL | Status: DC | PRN

## 2014-08-04 MED ORDER — HYDROMORPHONE HCL 1 MG/ML IJ SOLN
1.0000 mg | Freq: Once | INTRAMUSCULAR | Status: AC
  Administered 2014-08-04: 1 mg via INTRAMUSCULAR
  Filled 2014-08-04: qty 1

## 2014-08-04 MED ORDER — HYDROCODONE-ACETAMINOPHEN 5-325 MG PO TABS: 1.0000 | ORAL_TABLET | Freq: Four times a day (QID) | ORAL | Status: DC | PRN
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
## 2014-08-11 ENCOUNTER — Encounter: Payer: Self-pay | Admitting: Internal Medicine

## 2014-08-11 ENCOUNTER — Ambulatory Visit (INDEPENDENT_AMBULATORY_CARE_PROVIDER_SITE_OTHER): Payer: 59 | Admitting: Internal Medicine

## 2014-08-11 VITALS — BP 120/82 | HR 87 | Temp 98.8°F | Ht 65.0 in | Wt 335.2 lb

## 2014-08-11 DIAGNOSIS — R600 Localized edema: Secondary | ICD-10-CM | POA: Insufficient documentation

## 2014-08-11 DIAGNOSIS — I10 Essential (primary) hypertension: Secondary | ICD-10-CM

## 2014-08-11 DIAGNOSIS — R609 Edema, unspecified: Secondary | ICD-10-CM

## 2014-08-11 DIAGNOSIS — M542 Cervicalgia: Secondary | ICD-10-CM

## 2014-08-11 DIAGNOSIS — M25511 Pain in right shoulder: Secondary | ICD-10-CM

## 2014-08-11 MED ORDER — HYDROCODONE-ACETAMINOPHEN 10-325 MG PO TABS: 1.0000 | ORAL_TABLET | Freq: Four times a day (QID) | ORAL | Status: DC | PRN

## 2014-08-11 MED ORDER — PREDNISONE 10 MG PO TABS: ORAL_TABLET | ORAL | Status: DC
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
## 2014-08-11 NOTE — Progress Notes (Signed)
Pre visit review using our clinic review tool, if applicable. No additional management support is needed unless otherwise documented below in the visit note. 

## 2014-08-11 NOTE — Patient Instructions (Signed)
Please take all new medication as prescribed - the prednisone for possible nerve pain, and pain medication as needed (the hydrocodone)  You will be contacted regarding the referral for: Dr Amie CritchleyBlackwell  Please continue all other medications as before, and refills have been done if requested.  Please have the pharmacy call with any other refills you may need.  Please keep your appointments with your specialists as you may have planned

## 2014-08-13 NOTE — Assessment & Plan Note (Signed)
stable overall by history and exam, recent data reviewed with pt, and pt to continue medical treatment as before,  to f/u any worsening symptoms or concerns BP Readings from Last 3 Encounters:  08/11/14 120/82  08/04/14 114/59  06/09/14 120/82

## 2014-08-13 NOTE — Assessment & Plan Note (Signed)
Cant r/o cervical radiculopathy, also for ortho referral, pain control, hold PT for now, may need imaging as well
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
## 2014-08-13 NOTE — Assessment & Plan Note (Signed)
C/w venous insufficiency, for wt loss, low salt, exercise, declines compression stockings for now

## 2014-08-24 ENCOUNTER — Other Ambulatory Visit (HOSPITAL_COMMUNITY): Payer: Self-pay | Admitting: Orthopaedic Surgery

## 2014-08-24 DIAGNOSIS — M5412 Radiculopathy, cervical region: Secondary | ICD-10-CM

## 2014-08-25 ENCOUNTER — Other Ambulatory Visit: Payer: Self-pay | Admitting: Internal Medicine

## 2014-09-05 ENCOUNTER — Ambulatory Visit (HOSPITAL_COMMUNITY)
Admission: RE | Admit: 2014-09-05 | Discharge: 2014-09-05 | Disposition: A | Discharge status: Home / Self Care | Payer: 59 | Source: Ambulatory Visit | Attending: Orthopaedic Surgery | Admitting: Orthopaedic Surgery

## 2014-09-05 ENCOUNTER — Other Ambulatory Visit: Payer: Self-pay | Admitting: Orthopaedic Surgery

## 2014-09-05 DIAGNOSIS — M5412 Radiculopathy, cervical region: Secondary | ICD-10-CM

## 2014-09-05 DIAGNOSIS — M542 Cervicalgia: Secondary | ICD-10-CM

## 2014-09-07 ENCOUNTER — Ambulatory Visit (HOSPITAL_COMMUNITY): Admission: RE | Admit: 2014-09-07 | Payer: 59 | Source: Ambulatory Visit

## 2014-09-14 ENCOUNTER — Ambulatory Visit
Admission: RE | Admit: 2014-09-14 | Discharge: 2014-09-14 | Disposition: A | Discharge status: Home / Self Care | Payer: 59 | Source: Ambulatory Visit | Attending: Orthopaedic Surgery | Admitting: Orthopaedic Surgery

## 2014-09-14 DIAGNOSIS — M542 Cervicalgia: Secondary | ICD-10-CM

## 2014-10-05 ENCOUNTER — Ambulatory Visit: Payer: 59 | Attending: Orthopaedic Surgery | Admitting: Physical Therapy

## 2014-10-05 DIAGNOSIS — R209 Unspecified disturbances of skin sensation: Secondary | ICD-10-CM

## 2014-10-05 DIAGNOSIS — R208 Other disturbances of skin sensation: Secondary | ICD-10-CM | POA: Insufficient documentation

## 2014-10-05 DIAGNOSIS — M542 Cervicalgia: Secondary | ICD-10-CM | POA: Diagnosis present

## 2014-10-05 DIAGNOSIS — M25511 Pain in right shoulder: Secondary | ICD-10-CM | POA: Diagnosis not present
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
## 2014-10-10 ENCOUNTER — Ambulatory Visit: Payer: 59 | Admitting: Physical Therapy

## 2014-10-10 ENCOUNTER — Telehealth: Payer: Self-pay | Admitting: *Deleted

## 2014-10-10 DIAGNOSIS — M542 Cervicalgia: Secondary | ICD-10-CM | POA: Diagnosis not present

## 2014-10-10 DIAGNOSIS — R209 Unspecified disturbances of skin sensation: Secondary | ICD-10-CM

## 2014-10-10 DIAGNOSIS — M25511 Pain in right shoulder: Secondary | ICD-10-CM

## 2014-10-10 NOTE — Therapy (Signed)
Outpatient Rehabilitation [facility] [address] [place], [place], [address] Phone: [phone]   Fax:  [phone]  Physical Therapy Treatment  Patient Details  Name: Vanessa [name] MRN: [id number] Date of Birth: 1970/12/19  Encounter Date: 10/10/2014      PT End of Session - 10/10/14 1538    Visit Number 2   Number of Visits 16   Date for PT Re-Evaluation 11/30/14   PT Start Time 1500   PT Stop Time 1550   PT Time Calculation (min) 50 min   Activity Tolerance Patient limited by pain      Past Medical History  Diagnosis Date  . Seasonal allergies   . Asthma   . Hypertension   . Thyroid disease   . Lumbar disc disease 05/10/2013  . Unspecified hypothyroidism 07/29/2013    Past Surgical History  Procedure Laterality Date  . Thyroid surgery    . Rotator cuff repair    . Carpal tunnel release      There were no vitals taken for this visit.  Visit Diagnosis:  Cervicalgia  Pain in joint, shoulder region, right  Sensory disturbance      Subjective Assessment - 10/10/14 1457    Symptoms Rt. side neck pain, soreness into Rt. UE . Tingling. Headache is improved.    Limitations Sitting;Lifting;Standing;Walking;House hold activities   How long can you sit comfortably? 10-15 min    How long can you stand comfortably? 15-20 min   How long can you walk comfortably? 15-20 min   Diagnostic tests MRI on 09/13/14   Pain Score 5    Pain Location Neck   Pain Orientation Right   Pain Descriptors / Indicators Aching;Sore   Pain Onset More than a month ago   Multiple Pain Sites No          [facility] PT Assessment - 10/10/14 1540    AROM   Right Shoulder Flexion 80 Degrees  130 deg with AAROM on pulleys          [facility] Adult PT Treatment/Exercise - 10/10/14 1503    Neck Exercises: Stretches   Other Neck Stretches --  cervical AROM all planes   Neck Exercises: Supine   Neck Retraction 10 reps   Other Supine Exercise --  OA nod into ball, side ot side x10  each   Shoulder Exercises: Pulleys   Other Pulley Exercises --  overhead 2 min with correction    Manual Therapy   Manual Therapy Passive ROM;Manual Traction    decreased tolerance for manual, palpation Unable to perform supine cane AAROM for shoulder for flexion and abduction Did chest press with cane x10 with difficulty.      PT Education - 10/10/14 1538    Education provided Yes   Education Details initial HEP   Person(s) Educated Patient   Methods Explanation;Demonstration   Comprehension Verbalized understanding;Need further instruction          PT Short Term Goals - 10/10/14 1540    PT SHORT TERM GOAL #1   Title Pt. will understand posture, body mechanics and RICe as it relates to pain relief and prevention of further disability.    Status On-going   PT SHORT TERM GOAL #2   Title Pt. will report headaches relieved by 25% to improve work, concentration   Status On-going   PT SHORT TERM GOAL #3   Title Pt. will increase cervical rotation to Rt. to 45 deg or more for normal movement patterns.    Status On-going  PT SHORT TERM GOAL #4   Title Pt. will be able to lift arm to shoulder height and report min pain increase   Status On-going            Plan - 10/10/14 1538    Clinical Impression Statement No goals met since eval, given initial HEP which was very painful.  Very limited in tolerance for AROM of neck and bilateral UEs. Cont as tolerated.    Rehab Potential Good   PT Next Visit Plan review neck tension stretches and chin tuck, sitting posture, try manual consider tape   Consulted and Agree with Plan of Care Patient         Problem List Patient Active Problem List   Diagnosis Date Noted  . Peripheral edema 08/11/2014  . Neck pain on right side 08/11/2014  . Menstrual bleeding problem 06/10/2014  . Right shoulder pain 06/09/2014  . Acute upper respiratory infections of unspecified site 06/09/2014  . Shoulder bursitis 05/29/2014  . Asthma with  acute exacerbation 10/11/2013  . Unspecified hypothyroidism 07/29/2013  . Right cervical radiculopathy 07/19/2013  . Morbid obesity 07/19/2013  . Lumbar disc disease 05/10/2013  . Lower back pain 01/02/2013  . Fatigue 11/09/2012  . Left knee pain 11/09/2012  . Hypersomnolence 03/18/2012  . Seasonal and perennial allergic rhinitis   . Allergic-infective asthma   . Hypertension   . Other specified acquired hypothyroidism   . Preventative health care 03/12/2012   Raeford Razor, PT 10/10/2014 3:45 PM Phone: [phone] Fax: [phone]   PAA,JENNIFER 10/10/2014, 3:43 PM

## 2014-10-10 NOTE — Patient Instructions (Signed)
Posture Tips DO: - stand tall and erect - keep chin tucked in - keep head and shoulders in alignment - check posture regularly in mirror or large window - pull head back against headrest in car seat;  Change your position often.  Sit with lumbar support. DON'T: - slouch or slump while watching TV or reading - sit, stand or lie in one position  for too long;  Sitting is especially hard on the spine so if you sit at a desk/use the computer, then stand up often!   Copyright  VHI. All rights reserved.  Posture - Standing   Good posture is important. Avoid slouching and forward head thrust. Maintain curve in low back and align ears over shoul- ders, hips over ankles.  Pull your belly button in toward your back bone.   Copyright  VHI. All rights reserved.  Posture - Sitting   Sit upright, head facing forward. Try using a roll to support lower back. Keep shoulders relaxed, and avoid rounded back. Keep hips level with knees. Avoid crossing legs for long periods.   Copyright  VHI. All rights reserved.   Head Press With Chin Tuck   Tuck chin SLIGHTLY toward chest, keep mouth closed. Feel weight on back of head. Increase weight by pressing head down. Hold _5-10__ seconds. Relax. Repeat __5-10_ times. Surface: floor or pillow  Copyright  VHI. All rights reserved.    Levator Stretch   Grasp seat or sit on hand on side to be stretched. Turn head toward other side and look down. Use hand on head to gently stretch neck in that position. Hold __20-30__ seconds. Repeat on other side. Repeat 2-3__ times. Do __2__ sessions per day.  http://gt2.exer.us/30   Copyright  VHI. All rights reserved.  Side-Bending   One hand on opposite side of head, pull head to side as far as is comfortable. Stop if there is pain. Hold __20-30__ seconds. Repeat with other hand to other side. Repeat __2-3_ times. Do ___2_ sessions per day.   Copyright  VHI. All rights reserved.  Scapular Retraction  (Standing)   With arms at sides, pinch shoulder blades together. Repeat ___10-20_ times per set. Do _1-2___ sets per session. Do __2__ sessions per day.  http://orth.exer.us/944   Copyright  VHI. All rights reserved.  Chin Protraction / Retraction  Copyright  VHI. All rights reserved.

## 2014-10-10 NOTE — Telephone Encounter (Signed)
appts made and printed...td 

## 2014-10-12 ENCOUNTER — Ambulatory Visit: Payer: 59 | Admitting: Physical Therapy

## 2014-10-12 DIAGNOSIS — M542 Cervicalgia: Secondary | ICD-10-CM | POA: Diagnosis not present

## 2014-10-12 DIAGNOSIS — R209 Unspecified disturbances of skin sensation: Secondary | ICD-10-CM

## 2014-10-12 DIAGNOSIS — M25511 Pain in right shoulder: Secondary | ICD-10-CM

## 2014-10-12 NOTE — Therapy (Signed)
[facility]patient Rehabilitation [facility] [address] [place], [place], [address] Phone: [phone]   Fax:  [phone]  Physical Therapy Treatment  Patient Details  Name: Vanessa SawyersStacey [name] MRN: [id number] Date of Birth: 26-Jul-1971  Encounter Date: 10/12/2014      PT End of Session - 10/12/14 1041    Visit Number 3   Number of Visits 16   Date for PT Re-Evaluation 11/30/14   PT Start Time 0845   PT Stop Time 0944   PT Time Calculation (min) 59 min   Activity Tolerance Patient tolerated treatment well;Patient limited by pain      Past Medical History  Diagnosis Date  . Seasonal allergies   . Asthma   . Hypertension   . Thyroid disease   . Lumbar disc disease 05/10/2013  . Unspecified hypothyroidism 07/29/2013    Past Surgical History  Procedure Laterality Date  . Thyroid surgery    . Rotator cuff repair    . Carpal tunnel release      There were no vitals taken for this visit.  Visit Diagnosis:  Cervicalgia  Pain in joint, shoulder region, right  Sensory disturbance      Subjective Assessment - 10/12/14 0859    Symptoms 5/10 headach, posterior head, Able to move around, able to strrengthen shoulders.  Doing her home exercises.   Currently in Pain? Yes   Pain Score 5    Pain Location Neck   Pain Orientation Right   Pain Descriptors / Indicators Aching;Tingling   Pain Radiating Towards shoulder blades   Pain Onset More than a month ago   Aggravating Factors  doing discharges at work    Effect of Pain on Daily Activities ice , pain medications   Multiple Pain Sites No            [facility] Adult PT Treatment/Exercise - 10/12/14 0909    Neck Exercises: Stretches   Levator Stretch Limitations --  3 reps   Other Neck Stretches --  New home exercises, Nech headach tension stretches 3 differe   Other Neck Stretches --  40 degrees Rt , 72 degrees Lt rotation neck   Neck Exercises: Seated   Cervical Isometrics Limitations --  4 way 5 reps, 5 second  holds. added to home exercises.   Neck Retraction 5 reps;5 secs  good technique, she does this frequently   Moist Heat Therapy   Number Minutes Moist Heat 15 Minutes   Moist Heat Location --  neck   Electrical Stimulation   Electrical Stimulation Location --  Neck   Electrical Stimulation Action IFC   Electrical Stimulation Parameters --  to tolerance   Electrical Stimulation Goals Pain   Manual Therapy   Manual Therapy --  kinesiotex taping Rt neck to create space,          PT Education - 10/12/14 1041    Education provided Yes   Person(s) Educated Patient   Methods Explanation;Demonstration;Handout   Comprehension Verbalized understanding          PT Short Term Goals - 10/12/14 1044    PT SHORT TERM GOAL #1   Title Pt. will understand posture, body mechanics and RICe as it relates to pain relief and prevention of further disability.    Time 4   Period Weeks   Status On-going   PT SHORT TERM GOAL #2   Title Pt. will report headaches relieved by 25% to improve work, concentration   Time 4   Period Weeks   Status  On-going   PT SHORT TERM GOAL #3   Title Pt. will increase cervical rotation to Rt. to 45 deg or more for normal movement patterns.    Time 4   Period Weeks   Status On-going   PT SHORT TERM GOAL #4   Title Pt. will be able to lift arm to shoulder height and report min pain increase   Time 4   Status Unable to assess   PT SHORT TERM GOAL #5   Title Pt.will be able to touch back of head for improved ADLs, grooming.    Time 4   Status Unable to assess            Plan - 10/12/14 1042    Clinical Impression Statement HA eased some with exercise.  Able to make progress toward exercise goals.   PT Next Visit Plan review home exercises, assess tape, neck tension stretches to continue.   Consulted and Agree with Plan of Care Patient                               Problem List Patient Active Problem List   Diagnosis Date  Noted  . Peripheral edema 08/11/2014  . Neck pain on right side 08/11/2014  . Menstrual bleeding problem 06/10/2014  . Right shoulder pain 06/09/2014  . Acute upper respiratory infections of unspecified site 06/09/2014  . Shoulder bursitis 05/29/2014  . Asthma with acute exacerbation 10/11/2013  . Unspecified hypothyroidism 07/29/2013  . Right cervical radiculopathy 07/19/2013  . Morbid obesity 07/19/2013  . Lumbar disc disease 05/10/2013  . Lower back pain 01/02/2013  . Fatigue 11/09/2012  . Left knee pain 11/09/2012  . Hypersomnolence 03/18/2012  . Seasonal and perennial allergic rhinitis   . Allergic-infective asthma   . Hypertension   . Other specified acquired hypothyroidism   . Preventative health care 03/12/2012   Liz BeachKaren Harris, PTA 10/12/2014 10:53 AM Phone: [phone] Fax: [phone]  [facility] Hospital[facility]IS,KAREN 10/12/2014, 10:53 AM

## 2014-10-12 NOTE — Patient Instructions (Signed)
Standard instructions issued for neck isometrics and for Headaches/ self SNAGS.Neck: Lateral Tilt   Sit with back straight. Tilt head slightly to right while resisting with fingertips. Do not let head move. Do not allow trunk to lean sideways. Hold ____5 seconds. Repeat ___5_ times. Do _1 to 2___ sessions per day. CAUTION: Use steady pressure with fingertips only.  Copyright  VHI. All rights reserved.  Neck Flexors   Sit with back straight. Tilt head slightly forward while resisting with fingertips. Do not let head move. Keep chin tucked. Do not allow trunk to lean forward. Hold __5__ seconds. Repeat ___5_ times. Do _1 to 2Wall Shoulder Press-Out     http://cc.exer.us/56   Copyright  VHI. All rights reserved.   CAUTION: Use steady pressure with fingertips only.  Copyright  VHI. All rights reserved.

## 2014-10-16 ENCOUNTER — Ambulatory Visit: Payer: 59 | Admitting: Physical Therapy

## 2014-10-16 DIAGNOSIS — M25511 Pain in right shoulder: Secondary | ICD-10-CM

## 2014-10-16 DIAGNOSIS — M542 Cervicalgia: Secondary | ICD-10-CM

## 2014-10-16 NOTE — Patient Instructions (Signed)
Patient to remove tape if irritating.

## 2014-10-16 NOTE — Therapy (Signed)
[facility]patient Rehabilitation [facility] [address] [place], [place], [address] Phone: [phone]   Fax:  [phone]  Physical Therapy Treatment  Patient Details  Name: Vanessa [name] MRN: [id number] Date of Birth: April 09, 1971  Encounter Date: 10/16/2014      PT End of Session - 10/16/14 1739    Visit Number 4   Number of Visits 16   Date for PT Re-Evaluation 11/30/14   PT Start Time 1540   PT Stop Time 1635   PT Time Calculation (min) 55 min   Activity Tolerance Patient tolerated treatment well;Patient limited by pain      Past Medical History  Diagnosis Date  . Seasonal allergies   . Asthma   . Hypertension   . Thyroid disease   . Lumbar disc disease 05/10/2013  . Unspecified hypothyroidism 07/29/2013    Past Surgical History  Procedure Laterality Date  . Thyroid surgery    . Rotator cuff repair    . Carpal tunnel release      There were no vitals taken for this visit.  Visit Diagnosis:  Cervicalgia  Pain in joint, shoulder region, right      Subjective Assessment - 10/16/14 1557    Pain Radiating Towards Rt shoulder    Has a headachy, migraine.  Taping helpful. Working on her exercises and her posture. Does not feel good today due to sinus issues.        [facility] Adult PT Treatment/Exercise - 10/16/14 1540    Moist Heat Therapy   Number Minutes Moist Heat 20 Minutes   Moist Heat Location --  neck, back   Electrical Stimulation   Electrical Stimulation Location --  neck, Rt shoulder   Electrical Stimulation Action IFC  IFC   Electrical Stimulation Parameters 5   Electrical Stimulation Goals Pain   Manual Therapy   Manual Therapy Other (comment)  very light myofascial release, very light soft tissue work.   Other Manual Therapy --  taping "Y" end at base of skull, tails aling spine,            PT Short Term Goals - 10/16/14 1743    PT SHORT TERM GOAL #1   Title Pt. will understand posture, body mechanics and RICe as it relates  to pain relief and prevention of further disability.    Time 4   Period Weeks   Status On-going   PT SHORT TERM GOAL #2   Title Pt. will report headaches relieved by 25% to improve work, concentration   Time 4   Period Weeks   Status On-going   PT SHORT TERM GOAL #3   Title Pt. will increase cervical rotation to Rt. to 45 deg or more for normal movement patterns.    Time 4   Period Weeks   Status On-going   PT SHORT TERM GOAL #4   Title Pt. will be able to lift arm to shoulder height and report min pain increase   Period Weeks   Status On-going            Plan - 10/16/14 1744    Clinical Impression Statement Measure arm and neck motions, tape helpful, fascia more mobile, manual hurts, but helps, try yellow bands for rows, door way stretch   PT Next Visit Plan rows yellow, measure, doorway stretch.   Consulted and Agree with Plan of Care Patient  Problem List Patient Active Problem List   Diagnosis Date Noted  . Peripheral edema 08/11/2014  . Neck pain on right side 08/11/2014  . Menstrual bleeding problem 06/10/2014  . Right shoulder pain 06/09/2014  . Acute upper respiratory infections of unspecified site 06/09/2014  . Shoulder bursitis 05/29/2014  . Asthma with acute exacerbation 10/11/2013  . Unspecified hypothyroidism 07/29/2013  . Right cervical radiculopathy 07/19/2013  . Morbid obesity 07/19/2013  . Lumbar disc disease 05/10/2013  . Lower back pain 01/02/2013  . Fatigue 11/09/2012  . Left knee pain 11/09/2012  . Hypersomnolence 03/18/2012  . Seasonal and perennial allergic rhinitis   . Allergic-infective asthma   . Hypertension   . Other specified acquired hypothyroidism   . Preventative health care 03/12/2012   Liz BeachKaren Harris, PTA 10/16/2014 5:47 PM Phone: [phone] Fax: [phone]   [facility],KAREN 10/16/2014, 5:47 PM

## 2014-10-20 ENCOUNTER — Ambulatory Visit: Payer: 59 | Admitting: Physical Therapy

## 2014-10-24 ENCOUNTER — Encounter: Payer: Self-pay | Admitting: Physical Therapy

## 2014-10-25 ENCOUNTER — Ambulatory Visit: Payer: 59 | Admitting: Rehabilitation

## 2014-10-25 DIAGNOSIS — M25511 Pain in right shoulder: Secondary | ICD-10-CM

## 2014-10-25 DIAGNOSIS — M542 Cervicalgia: Secondary | ICD-10-CM | POA: Diagnosis not present

## 2014-10-25 DIAGNOSIS — R209 Unspecified disturbances of skin sensation: Secondary | ICD-10-CM

## 2014-10-25 NOTE — Patient Instructions (Addendum)
External Rotation (Eccentric), (Resistance Band)   Quickly pull band outward with forearm of affected arm. Keep elbows at sides, wrists neutral. Slowly return to start for 3-5 seconds. Use __yellow______ resistance band. Hint: Use towel roll between elbow and hip. _10__ reps per set, 2-4___ sets per day, __7_ days per week.    Copyright  VHI. All rights reserved.  Resistive Band Rowing   With resistive band anchored in door, grasp both ends. Keeping elbows bent, pull back, squeezing shoulder blades together. Hold _5___ seconds. Repeat __10-20__ times. Do __2__ sessions per day.  http://gt2.exer.us/97   Copyright  VHI. All rights reserved.

## 2014-10-25 NOTE — Therapy (Signed)
[facility] Outpatient Rehabilitation [facility] [address] [place], [place], [address] Phone: [phone]   Fax:  [phone]  Physical Therapy Treatment  Patient Details  Name: Vanessa [name] MRN: [id number] Date of Birth: 1971/04/21  Encounter Date: 10/25/2014      PT End of Session - 10/25/14 1504    Visit Number 5   Number of Visits 16   Date for PT Re-Evaluation 11/30/14   PT Start Time 0217   PT Stop Time 0315   PT Time Calculation (min) 58 min      Past Medical History  Diagnosis Date  . Seasonal allergies   . Asthma   . Hypertension   . Thyroid disease   . Lumbar disc disease 05/10/2013  . Unspecified hypothyroidism 07/29/2013    Past Surgical History  Procedure Laterality Date  . Thyroid surgery    . Rotator cuff repair    . Carpal tunnel release      There were no vitals taken for this visit.  Visit Diagnosis:  No diagnosis found.      Subjective Assessment - 10/25/14 1422    Symptoms Pain in back of neck/tenderness post neck and left anterior shoulder   Currently in Pain? Yes   Pain Score 4    Pain Location Neck  left shoulder   Pain Descriptors / Indicators Tender  numb right thumb   Aggravating Factors  pulling, bending over, cervical flexion   Pain Relieving Factors cold compress, meds   Multiple Pain Sites No          [facility] PT Assessment - 10/25/14 1429    AROM   Right Shoulder Flexion 125 Degrees   Right Shoulder ABduction 88 Degrees   Right Shoulder Internal Rotation --  T 12   Right Shoulder External Rotation --  T1   Cervical Flexion 45   Cervical Extension 45   Cervical - Right Side Bend 50   Cervical - Left Side Bend 50   Cervical - Right Rotation 75   Cervical - Left Rotation 75   Strength   Right Shoulder Flexion 3/5   Right Shoulder Extension 4-/5   Right Shoulder ABduction 3/5   Right Shoulder Internal Rotation 4/5   Right Shoulder External Rotation 3+/5   Left Shoulder Flexion 4/5   Left Shoulder  Extension 4-/5   Left Shoulder Internal Rotation --  4+5   Left Shoulder External Rotation 4/5                  [facility] Adult PT Treatment/Exercise - 10/25/14 1439    Neck Exercises: Stretches   Corner Stretch 3 reps;30 seconds  doorway   Neck Exercises: Theraband   Scapula Retraction 10 reps;Red   Shoulder External Rotation 10 reps  yellow, bilateral   Other Theraband Exercises punch yellow x 10 each   Shoulder Exercises: Pulleys   Flexion 2 minutes   ABduction 2 minutes   Modalities   Modalities Electrical Stimulation;Moist Heat   Moist Heat Therapy   Number Minutes Moist Heat 15 Minutes   Moist Heat Location Shoulder  neck   Electrical Stimulation   Electrical Stimulation Location --  neck shoulder   Electrical Stimulation Action IFC   Electrical Stimulation Parameters 7   Electrical Stimulation Goals Pain   Manual Therapy   Other Manual Therapy --  KT tape reapplied as previous                PT Education - 10/25/14 1504  Education provided Yes   Education Details Strength HEP   Person(s) Educated Patient   Methods Explanation;Handout   Comprehension Verbalized understanding          PT Short Term Goals - 10/25/14 1425    PT SHORT TERM GOAL #1   Title Pt. will understand posture, body mechanics and RICe as it relates to pain relief and prevention of further disability.    Time 4   Period Weeks   Status On-going   PT SHORT TERM GOAL #2   Title Pt. will report headaches relieved by 25% to improve work, concentration   Time 4   Period Weeks   Status Achieved   PT SHORT TERM GOAL #3   Title Pt. will increase cervical rotation to Rt. to 45 deg or more for normal movement patterns.    Time 4   Period Weeks   Status Achieved   PT SHORT TERM GOAL #4   Title Pt. will be able to lift arm to shoulder height and report min pain increase   Time 4   Period Weeks   Status On-going   PT SHORT TERM GOAL #5   Title Pt.will be able to touch back  of head for improved ADLs, grooming.    Time 4   Period Weeks   Status Achieved           PT Long Term Goals - 10/05/14 2041    PT LONG TERM GOAL #1   Title Pt. will be able to perform advanced HEP with I   Time 8   Period Weeks   Status New   PT LONG TERM GOAL #2   Title pt. will be able to return to work with min difficulty   Time 8   Period Weeks   Status New   PT LONG TERM GOAL #3   Title Pt. will sit for 30 min and report no increase in apin   Time 8   Period Weeks   PT LONG TERM GOAL #4   Title Pt. will report improvement in Rt. UE sensory sx by 50% or more   Time 8   Period Weeks   Status New   PT LONG TERM GOAL #5   Title Pt. will do light housework and complete ADLS with min difficulty    Time 8   Period Weeks   Status New               Plan - 10/25/14 1427    Clinical Impression Statement Improved reaching behind head and improved reach forward flexion with moderate increase in pain with pushing cleaning cart at work.AROM [place]oulder and neck improved. STG# 2,3,5MET   PT Next Visit Plan Review new HEP        Problem List Patient Active Problem List   Diagnosis Date Noted  . Peripheral edema 08/11/2014  . Neck pain on right side 08/11/2014  . Menstrual bleeding problem 06/10/2014  . Right shoulder pain 06/09/2014  . Acute upper respiratory infections of unspecified site 06/09/2014  . Shoulder bursitis 05/29/2014  . Asthma with acute exacerbation 10/11/2013  . Unspecified hypothyroidism 07/29/2013  . Right cervical radiculopathy 07/19/2013  . Morbid obesity 07/19/2013  . Lumbar disc disease 05/10/2013  . Lower back pain 01/02/2013  . Fatigue 11/09/2012  . Left knee pain 11/09/2012  . Hypersomnolence 03/18/2012  . Seasonal and perennial allergic rhinitis   . Allergic-infective asthma   . Hypertension   . Other specified acquired hypothyroidism   .  Preventative health care 03/12/2012    Sherrie MustacheDonoho, Perl Kerney McGee, PTA 10/25/2014, 3:08  PM  [facility] [address] [place], [place], [address] Phone: [phone]   Fax:  [phone]

## 2014-11-01 ENCOUNTER — Ambulatory Visit: Payer: 59 | Admitting: Rehabilitation

## 2014-11-01 DIAGNOSIS — M542 Cervicalgia: Secondary | ICD-10-CM | POA: Diagnosis not present

## 2014-11-01 DIAGNOSIS — M25511 Pain in right shoulder: Secondary | ICD-10-CM

## 2014-11-01 DIAGNOSIS — R209 Unspecified disturbances of skin sensation: Secondary | ICD-10-CM

## 2014-11-01 NOTE — Therapy (Signed)
[facility] [address] [place], [place], [address] Phone: [phone]   Fax:  [phone]  Physical Therapy Treatment  Patient Details  Name: Vanessa [name] MRN: [id number] Date of Birth: [birth year]  Encounter Date: 11/01/2014      PT End of Session - 11/01/14 1245    Visit Number 6   Number of Visits 16   Date for PT Re-Evaluation 11/30/14   PT Start Time 1145   PT Stop Time 1245   PT Time Calculation (min) 60 min      Past Medical History  Diagnosis Date  . Seasonal allergies   . Asthma   . Hypertension   . Thyroid disease   . Lumbar disc disease 05/10/2013  . Unspecified hypothyroidism 07/29/2013    Past Surgical History  Procedure Laterality Date  . Thyroid surgery    . Rotator cuff repair    . Carpal tunnel release      There were no vitals taken for this visit.  Visit Diagnosis:  Pain in joint, shoulder region, right  Sensory disturbance  Cervicalgia      Subjective Assessment - 11/01/14 1159    Symptoms Pain in bilateral anterior shoulders, slight headache, I feel I can turn my head a little better   Pertinent History Rt. RC surgery in 2003.     Limitations Sitting;Lifting;Standing;Walking;House hold activities   Diagnostic tests MRI on 09/13/14   Patient Stated Goals regain strength in arm and ease headaches   Currently in Pain? Yes   Pain Score 5    Pain Location Shoulder  bilateral   Pain Descriptors / Indicators Tightness  stiffness   Aggravating Factors  pulling, lifting   Pain Relieving Factors cold compress, meds   Multiple Pain Sites No           [facility] Adult PT Treatment/Exercise - 11/01/14 1221    Neck Exercises: Machines for Strengthening   UBE (Upper Arm Bike) 3 min for 3 min back level 1    Neck Exercises: Theraband   Scapula Retraction Red;15 reps   Shoulder External Rotation 20 reps  yellow, bilateral   Shoulder Internal Rotation 20 reps  yellow   Other Theraband  Exercises punch yellow x 120 each   Shoulder Exercises: Pulleys   Flexion 2 minutes   ABduction 2 minutes   Modalities   Modalities Electrical Stimulation;Moist Heat   Moist Heat Therapy   Number Minutes Moist Heat 15 Minutes   Moist Heat Location Shoulder  neck   Electrical Stimulation   Electrical Stimulation Location --  neck shoulder   Electrical Stimulation Action IFC   Electrical Stimulation Parameters 9   Electrical Stimulation Goals Pain   Manual Therapy   Other Manual Therapy --  KT tape applied as previous           PT Short Term Goals - 10/25/14 1425    PT SHORT TERM GOAL #1   Title Pt. will understand posture, body mechanics and RICe as it relates to pain relief and prevention of further disability.    Time 4   Period Weeks   Status On-going   PT SHORT TERM GOAL #2   Title Pt. will report headaches relieved by 25% to improve work, concentration   Time 4   Period Weeks   Status Achieved   PT SHORT TERM GOAL #3   Title Pt. will increase cervical rotation to Rt. to 45 deg or more for normal movement patterns.    Time  4   Period Weeks   Status Achieved   PT SHORT TERM GOAL #4   Title Pt. will be able to lift arm to shoulder height and report min pain increase   Time 4   Period Weeks   Status On-going   PT SHORT TERM GOAL #5   Title Pt.will be able to touch back of head for improved ADLs, grooming.    Time 4   Period Weeks   Status Achieved           PT Long Term Goals - 10/05/14 2041    PT LONG TERM GOAL #1   Title Pt. will be able to perform advanced HEP with I   Time 8   Period Weeks   Status New   PT LONG TERM GOAL #2   Title pt. will be able to return to work with min difficulty   Time 8   Period Weeks   Status New   PT LONG TERM GOAL #3   Title Pt. will sit for 30 min and report no increase in apin   Time 8   Period Weeks   PT LONG TERM GOAL #4   Title Pt. will report improvement in Rt. UE sensory sx by 50% or more   Time 8    Period Weeks   Status New   PT LONG TERM GOAL #5   Title Pt. will do light housework and complete ADLS with min difficulty    Time 8   Period Weeks   Status New      Problem List Patient Active Problem List   Diagnosis Date Noted  . Peripheral edema 08/11/2014  . Neck pain on right side 08/11/2014  . Menstrual bleeding problem 06/10/2014  . Right shoulder pain 06/09/2014  . Acute upper respiratory infections of unspecified site 06/09/2014  . Shoulder bursitis 05/29/2014  . Asthma with acute exacerbation 10/11/2013  . Unspecified hypothyroidism 07/29/2013  . Right cervical radiculopathy 07/19/2013  . Morbid obesity 07/19/2013  . Lumbar disc disease 05/10/2013  . Lower back pain 01/02/2013  . Fatigue 11/09/2012  . Left knee pain 11/09/2012  . Hypersomnolence 03/18/2012  . Seasonal and perennial allergic rhinitis   . Allergic-infective asthma   . Hypertension   . Other specified acquired hypothyroidism   . Preventative health care 03/12/2012   CLINICAL IMPRESSION STATEMENT: Pt reports compliance with HEP, good relief with KT tape on posterior neck. She reports great frustration with her job and FMLA allowance for days off and P.T. visits. She reports increased stress with work.    PLAN: Continue Bilateral shoulder strengthening   Sherrie MustacheDonoho, Millie Forde McGee, PTA 11/01/2014, 1:01 PM  [facility] [address] [place], [place], [address] Phone: [phone]   Fax:  [phone]

## 2014-11-07 ENCOUNTER — Ambulatory Visit: Payer: 59 | Attending: Orthopaedic Surgery

## 2014-11-07 DIAGNOSIS — R208 Other disturbances of skin sensation: Secondary | ICD-10-CM | POA: Diagnosis not present

## 2014-11-07 DIAGNOSIS — M25511 Pain in right shoulder: Secondary | ICD-10-CM | POA: Insufficient documentation

## 2014-11-07 DIAGNOSIS — M542 Cervicalgia: Secondary | ICD-10-CM | POA: Insufficient documentation

## 2014-11-07 DIAGNOSIS — R209 Unspecified disturbances of skin sensation: Secondary | ICD-10-CM

## 2014-11-07 NOTE — Therapy (Signed)
[facility] Outpatient Rehabilitation [facility] [address] [place], [place], [address] Phone: [phone]   Fax:  [phone]  Physical Therapy Treatment  Patient Details  Name: Vanessa [name] MRN: [id number] Date of Birth: [birth year]  Encounter Date: 11/07/2014      PT End of Session - 11/07/14 1216    Visit Number 7   Number of Visits 16   Date for PT Re-Evaluation 11/30/14   PT Start Time 1105   PT Stop Time 1150   PT Time Calculation (min) 45 min   Activity Tolerance Patient tolerated treatment well;Patient limited by pain   Behavior During Therapy [facility] for tasks assessed/performed      Past Medical History  Diagnosis Date  . Seasonal allergies   . Asthma   . Hypertension   . Thyroid disease   . Lumbar disc disease 05/10/2013  . Unspecified hypothyroidism 07/29/2013    Past Surgical History  Procedure Laterality Date  . Thyroid surgery    . Rotator cuff repair    . Carpal tunnel release      There were no vitals taken for this visit.  Visit Diagnosis:  Pain in joint, shoulder region, right  Sensory disturbance  Cervicalgia      Subjective Assessment - 11/07/14 1112    Symptoms Neck is feeling better and can hold head better . She still has headaches and shoulder pain   Currently in Pain? Yes   Pain Score 3    Pain Location Shoulder  Pain generally worse with work   Pain Orientation Right;Left   Pain Descriptors / Indicators Tightness   Pain Type --  Sub acute   Pain Onset More than a month ago   Pain Frequency Constant   Aggravating Factors  Usign arms for work and home tasks   Pain Relieving Factors cold, meds   Multiple Pain Sites No                    [facility] Adult PT Treatment/Exercise - 11/07/14 1115    Neck Exercises: Machines for Strengthening   UBE (Upper Arm Bike) 6 min foreward   Neck Exercises: Theraband   Shoulder External Rotation 15 reps;Red   Shoulder Internal Rotation 15 reps  red band   Other  Theraband Exercises punch red  x 12 each   Shoulder Exercises: Pulleys   Flexion 2 minutes   ABduction 2 minutes   Modalities   Modalities Electrical Stimulation;Moist Heat   Moist Heat Therapy   Number Minutes Moist Heat 20 Minutes   Moist Heat Location Shoulder  and neck   Electrical Stimulation   Electrical Stimulation Location --  neck and shoulder   Electrical Stimulation Action IFC   Electrical Stimulation Parameters 10   Electrical Stimulation Goals Pain   Manual Therapy   Other Manual Therapy Taped with y fro biceps ant and psot, i band across deltoid with 75% stretch and i strip over trap from shoulder to neck                  PT Short Term Goals - 10/25/14 1425    PT SHORT TERM GOAL #1   Title Pt. will understand posture, body mechanics and RICe as it relates to pain relief and prevention of further disability.    Time 4   Period Weeks   Status On-going   PT SHORT TERM GOAL #2   Title Pt. will report headaches relieved by 25% to improve work, concentration   Time  4   Period Weeks   Status Achieved   PT SHORT TERM GOAL #3   Title Pt. will increase cervical rotation to Rt. to 45 deg or more for normal movement patterns.    Time 4   Period Weeks   Status Achieved   PT SHORT TERM GOAL #4   Title Pt. will be able to lift arm to shoulder height and report min pain increase   Time 4   Period Weeks   Status On-going   PT SHORT TERM GOAL #5   Title Pt.will be able to touch back of head for improved ADLs, grooming.    Time 4   Period Weeks   Status Achieved           PT Long Term Goals - 10/05/14 2041    PT LONG TERM GOAL #1   Title Pt. will be able to perform advanced HEP with I   Time 8   Period Weeks   Status New   PT LONG TERM GOAL #2   Title pt. will be able to return to work with min difficulty   Time 8   Period Weeks   Status New   PT LONG TERM GOAL #3   Title Pt. will sit for 30 min and report no increase in apin   Time 8   Period  Weeks   PT LONG TERM GOAL #4   Title Pt. will report improvement in Rt. UE sensory sx by 50% or more   Time 8   Period Weeks   Status New   PT LONG TERM GOAL #5   Title Pt. will do light housework and complete ADLS with min difficulty    Time 8   Period Weeks   Status New               Plan - 11/07/14 1217    Clinical Impression Statement She is tender anterior to posterior deltoid.  Slow progress.    Pt will benefit from skilled therapeutic intervention in order to improve on the following deficits Increased edema;Impaired flexibility;Pain;Improper body mechanics;Increased fascial restricitons;Decreased scar mobility;Decreased mobility;Decreased activity tolerance;Decreased endurance;Decreased range of motion;Decreased strength;Increased muscle spasms;Decreased balance;Difficulty walking;Impaired UE functional use   Rehab Potential Good   PT Frequency 2x / week   PT Duration 6 weeks   PT Treatment/Interventions ADLs/Self Care Home Management;Electrical Stimulation;Therapeutic exercise;Moist Heat;Balance training;Neuromuscular re-education;Functional mobility training;Traction;Cryotherapy;Ultrasound;Therapeutic activities;Passive range of motion;Manual techniques;Patient/family education   PT Next Visit Plan continue UBE and strengthening shoulders   Consulted and Agree with Plan of Care Patient        Problem List Patient Active Problem List   Diagnosis Date Noted  . Peripheral edema 08/11/2014  . Neck pain on right side 08/11/2014  . Menstrual bleeding problem 06/10/2014  . Right shoulder pain 06/09/2014  . Acute upper respiratory infections of unspecified site 06/09/2014  . Shoulder bursitis 05/29/2014  . Asthma with acute exacerbation 10/11/2013  . Unspecified hypothyroidism 07/29/2013  . Right cervical radiculopathy 07/19/2013  . Morbid obesity 07/19/2013  . Lumbar disc disease 05/10/2013  . Lower back pain 01/02/2013  . Fatigue 11/09/2012  . Left knee pain  11/09/2012  . Hypersomnolence 03/18/2012  . Seasonal and perennial allergic rhinitis   . Allergic-infective asthma   . Hypertension   . Other specified acquired hypothyroidism   . Preventative health care 03/12/2012    Caprice Red PT 11/07/2014, 12:19 PM  [facility] [address] [place], [place],  [address] Phone: [phone]   Fax:  [phone]

## 2014-11-14 ENCOUNTER — Ambulatory Visit: Payer: 59

## 2014-11-14 DIAGNOSIS — M542 Cervicalgia: Secondary | ICD-10-CM | POA: Diagnosis not present

## 2014-11-14 DIAGNOSIS — M25511 Pain in right shoulder: Secondary | ICD-10-CM

## 2014-11-14 DIAGNOSIS — R209 Unspecified disturbances of skin sensation: Secondary | ICD-10-CM

## 2014-11-14 NOTE — Therapy (Signed)
[facility] [address] [place], [place], [address] Phone: [phone]   Fax:  [phone]  Physical Therapy Treatment  Patient Details  Name: Vanessa SawyersStacey Reger MRN: [id number] Date of Birth: 1971-02-06 Referring Provider:  Kathryne HitchBlackman, Christopher Y*  Encounter Date: 11/14/2014      PT End of Session - 11/14/14 1626    Activity Tolerance Patient tolerated treatment well   Behavior During Therapy [facility] for tasks assessed/performed      Past Medical History  Diagnosis Date  . Seasonal allergies   . Asthma   . Hypertension   . Thyroid disease   . Lumbar disc disease 05/10/2013  . Unspecified hypothyroidism 07/29/2013    Past Surgical History  Procedure Laterality Date  . Thyroid surgery    . Rotator cuff repair    . Carpal tunnel release      There were no vitals taken for this visit.  Visit Diagnosis:  Pain in joint, shoulder region, right  Sensory disturbance  Cervicalgia      Subjective Assessment - 11/14/14 1549    Symptoms Bad day. Shoulder hurting after work. Have been doing exercises with band. Think needs stretching   Currently in Pain? Yes   Pain Location Shoulder   Pain Orientation Right   Pain Descriptors / Indicators Tightness;Tender   Pain Type Chronic pain   Pain Onset More than a month ago   Pain Frequency Constant   Aggravating Factors  work and using arm, pushing cart and reaching   Pain Relieving Factors cold, meds   Multiple Pain Sites No                    [facility] Adult PT Treatment/Exercise - 11/14/14 1556    Neck Exercises: Stretches   Upper Trapezius Stretch 2 reps;30 seconds   Levator Stretch 2 reps;30 seconds   Neck Exercises: Seated   Neck Retraction 5 reps   Other Seated Exercise Scapula retraction x10   Moist Heat Therapy   Number Minutes Moist Heat 20 Minutes   Moist Heat Location Shoulder  neck   Electrical Stimulation   Electrical Stimulation Location --  shoulder  and neck   Electrical Stimulation Action IFC   Electrical Stimulation Parameters 10   Electrical Stimulation Goals Pain   Manual Therapy   Manual Therapy Myofascial release;Massage;Manual Traction   Massage STW to bilat neck and shoulders   Myofascial Release Light touch with movement to neck and shoulder   Manual Traction 3x15 sec                  PT Short Term Goals - 11/14/14 1628    PT SHORT TERM GOAL #1   Title Pt. will understand posture, body mechanics and RICe as it relates to pain relief and prevention of further disability.    Status Achieved   PT SHORT TERM GOAL #2   Title Pt. will report headaches relieved by 25% to improve work, concentration   Status Achieved   PT SHORT TERM GOAL #3   Title Pt. will increase cervical rotation to Rt. to 45 deg or more for normal movement patterns.    Status Achieved   PT SHORT TERM GOAL #4   Title Pt. will be able to lift arm to shoulder height and report min pain increase  She is pain ful going over head   Status Achieved           PT Long Term Goals - 11/14/14 1629  PT LONG TERM GOAL #1   Title Pt. will be able to perform advanced HEP with I   Status On-going   PT LONG TERM GOAL #2   Title pt. will be able to return to work with min difficulty   Status On-going   PT LONG TERM GOAL #3   Title Pt. will sit for 30 min and report no increase in apin   Status On-going   PT LONG TERM GOAL #4   Title Pt. will report improvement in Rt. UE sensory sx by 50% or more   Status On-going   PT LONG TERM GOAL #5   Title Pt. will do light housework and complete ADLS with min difficulty    Status On-going               Plan - 11/14/14 1626    Clinical Impression Statement Worse today post work. Very  tender bilateral neck and traps. She is not really improveing so will see x2 and return to MD   Pt will benefit from skilled therapeutic intervention in order to improve on the following deficits Increased  edema;Impaired flexibility;Pain;Improper body mechanics;Increased fascial restricitons;Decreased scar mobility;Decreased mobility;Decreased activity tolerance;Decreased endurance;Decreased range of motion;Decreased strength;Increased muscle spasms;Decreased balance;Difficulty walking;Impaired UE functional use   Rehab Potential Fair   PT Frequency 2x / week   PT Duration 2 weeks   PT Treatment/Interventions ADLs/Self Care Home Management;Electrical Stimulation;Therapeutic exercise;Moist Heat;Balance training;Neuromuscular re-education;Functional mobility training;Traction;Cryotherapy;Ultrasound;Therapeutic activities;Passive range of motion;Manual techniques;Patient/family education   PT Next Visit Plan Resume strength and range exercises, modalities PRN, manual PRN   PT Home Exercise Plan Neck range   Consulted and Agree with Plan of Care Patient        Problem List Patient Active Problem List   Diagnosis Date Noted  . Peripheral edema 08/11/2014  . Neck pain on right side 08/11/2014  . Menstrual bleeding problem 06/10/2014  . Right shoulder pain 06/09/2014  . Acute upper respiratory infections of unspecified site 06/09/2014  . Shoulder bursitis 05/29/2014  . Asthma with acute exacerbation 10/11/2013  . Unspecified hypothyroidism 07/29/2013  . Right cervical radiculopathy 07/19/2013  . Morbid obesity 07/19/2013  . Lumbar disc disease 05/10/2013  . Lower back pain 01/02/2013  . Fatigue 11/09/2012  . Left knee pain 11/09/2012  . Hypersomnolence 03/18/2012  . Seasonal and perennial allergic rhinitis   . Allergic-infective asthma   . Hypertension   . Other specified acquired hypothyroidism   . Preventative health care 03/12/2012    Caprice Red PT 11/14/2014, 4:30 PM  [facility] [address] [place], [place], [address] Phone: [phone]   Fax:  [phone]

## 2014-11-17 ENCOUNTER — Ambulatory Visit: Payer: 59 | Admitting: Physical Therapy

## 2014-11-17 DIAGNOSIS — M542 Cervicalgia: Secondary | ICD-10-CM

## 2014-11-17 DIAGNOSIS — R209 Unspecified disturbances of skin sensation: Secondary | ICD-10-CM

## 2014-11-17 DIAGNOSIS — M25511 Pain in right shoulder: Secondary | ICD-10-CM

## 2014-11-17 NOTE — Therapy (Signed)
[place], [place], [address] Phone: [phone]   Fax:  [phone]  Physical Therapy Treatment  Patient Details  Name: Vanessa [name] MRN: [id number] Date of Birth: [birth year] Referring Provider:  Biagio Borg, MD  Encounter Date: 11/17/2014      PT End of Session - 11/17/14 1050    Visit Number 9   Number of Visits 16   Date for PT Re-Evaluation 11/30/14   PT Start Time 9767   PT Stop Time 1100   PT Time Calculation (min) 45 min   Activity Tolerance Patient tolerated treatment well   Behavior During Therapy [facility] for tasks assessed/performed      Past Medical History  Diagnosis Date  . Seasonal allergies   . Asthma   . Hypertension   . Thyroid disease   . Lumbar disc disease 05/10/2013  . Unspecified hypothyroidism 07/29/2013    Past Surgical History  Procedure Laterality Date  . Thyroid surgery    . Rotator cuff repair    . Carpal tunnel release      There were no vitals taken for this visit.  Visit Diagnosis:  Pain in joint, shoulder region, right  Sensory disturbance  Cervicalgia      Subjective Assessment - 11/17/14 1020    Symptoms Shoulder sore today; cold isn't helping.  Hurt after last session.  Appt 11/30/14 with MD.   Pertinent History Rt. RC surgery in 2003.     Limitations Sitting;Lifting;Standing;Walking;House hold activities   How long can you sit comfortably? 10-15 min    How long can you stand comfortably? 15-20 min   How long can you walk comfortably? 15-20 min   Diagnostic tests MRI on 09/13/14   Patient Stated Goals regain strength in arm and ease headaches   Currently in Pain? Yes   Pain Score 5    Pain Location Shoulder   Pain Orientation Right;Left   Pain Descriptors / Indicators Tightness;Tender   Pain Type Chronic pain   Pain Onset More than a month ago   Pain Frequency Constant                    [facility] Adult PT Treatment/Exercise -  11/17/14 1021    Neck Exercises: Stretches   Upper Trapezius Stretch 2 reps;30 seconds   Levator Stretch 2 reps;30 seconds   Neck Exercises: Machines for Strengthening   UBE (Upper Arm Bike) 8 min forward; level 2   Neck Exercises: Seated   Neck Retraction 10 reps   Shoulder Rolls 20 reps   Other Seated Exercise Scapula retraction x10   Modalities   Modalities Electrical Stimulation;Moist Heat   Moist Heat Therapy   Number Minutes Moist Heat 15 Minutes   Moist Heat Location Shoulder;Other (comment)  neck   Electrical Stimulation   Electrical Stimulation Location neck/R shoulder   Electrical Stimulation Action premod to both locations   Electrical Stimulation Parameters 15   Electrical Stimulation Goals Pain                PT Education - 11/17/14 1050    Education provided Yes   Education Details limited progress and recommendations for follow up with MD   Person(s) Educated Patient   Methods Explanation   Comprehension Verbalized understanding          PT Short Term Goals - 11/17/14 1052    PT SHORT TERM GOAL #1   Title Pt. will understand posture, body mechanics and RICe  as it relates to pain relief and prevention of further disability.    Status Achieved   PT SHORT TERM GOAL #2   Title Pt. will report headaches relieved by 25% to improve work, concentration   Status Achieved   PT SHORT TERM GOAL #3   Title Pt. will increase cervical rotation to Rt. to 45 deg or more for normal movement patterns.    Status Achieved   PT SHORT TERM GOAL #4   Title Pt. will be able to lift arm to shoulder height and report min pain increase   Status Achieved           PT Long Term Goals - 11/17/14 1052    PT LONG TERM GOAL #1   Title Pt. will be able to perform advanced HEP with I   Status On-going   PT LONG TERM GOAL #2   Title pt. will be able to return to work with min difficulty   Status Not Met   PT LONG TERM GOAL #3   Title Pt. will sit for 30 min and report  no increase in apin   Status Not Met   PT LONG TERM GOAL #4   Title Pt. will report improvement in Rt. UE sensory sx by 50% or more   Status Not Met   PT LONG TERM GOAL #5   Title Pt. will do light housework and complete ADLS with min difficulty    Status Not Met               Plan - 11/17/14 1050    Clinical Impression Statement Pt presents today with no improvement in pain and overall no benefit from PT.  At this time recommend holding PT until pt sees MD to determine any further recommendations/follow up.  Pt agreeable to plan.   PT Next Visit Plan hold PT; pt to see MD 11/30/14   Consulted and Agree with Plan of Care Patient        Problem List Patient Active Problem List   Diagnosis Date Noted  . Peripheral edema 08/11/2014  . Neck pain on right side 08/11/2014  . Menstrual bleeding problem 06/10/2014  . Right shoulder pain 06/09/2014  . Acute upper respiratory infections of unspecified site 06/09/2014  . Shoulder bursitis 05/29/2014  . Asthma with acute exacerbation 10/11/2013  . Unspecified hypothyroidism 07/29/2013  . Right cervical radiculopathy 07/19/2013  . Morbid obesity 07/19/2013  . Lumbar disc disease 05/10/2013  . Lower back pain 01/02/2013  . Fatigue 11/09/2012  . Left knee pain 11/09/2012  . Hypersomnolence 03/18/2012  . Seasonal and perennial allergic rhinitis   . Allergic-infective asthma   . Hypertension   . Other specified acquired hypothyroidism   . Preventative health care 03/12/2012   Laureen Abrahams, PT, DPT 11/17/2014 11:04 AM  [facility] [address] [place], [place], [address] Phone: [phone]   Fax:  [phone]

## 2014-11-28 ENCOUNTER — Encounter: Payer: Self-pay | Admitting: Physical Therapy

## 2014-12-04 ENCOUNTER — Other Ambulatory Visit: Payer: Self-pay | Admitting: Orthopaedic Surgery

## 2014-12-04 DIAGNOSIS — M25511 Pain in right shoulder: Secondary | ICD-10-CM

## 2014-12-13 ENCOUNTER — Other Ambulatory Visit: Payer: Self-pay | Admitting: *Deleted

## 2014-12-13 MED ORDER — ALBUTEROL SULFATE HFA 108 (90 BASE) MCG/ACT IN AERS
2.0000 | INHALATION_SPRAY | Freq: Four times a day (QID) | RESPIRATORY_TRACT | Status: DC | PRN
Start: 2014-12-13 — End: 2016-02-06

## 2014-12-13 MED ORDER — BECLOMETHASONE DIPROPIONATE 80 MCG/ACT IN AERS: 1.0000 | INHALATION_SPRAY | RESPIRATORY_TRACT | Status: DC | PRN

## 2014-12-14 ENCOUNTER — Other Ambulatory Visit: Payer: Self-pay

## 2014-12-17 ENCOUNTER — Ambulatory Visit
Admission: RE | Admit: 2014-12-17 | Discharge: 2014-12-17 | Disposition: A | Discharge status: Home / Self Care | Payer: 59 | Source: Ambulatory Visit | Attending: Orthopaedic Surgery | Admitting: Orthopaedic Surgery

## 2014-12-17 DIAGNOSIS — M25511 Pain in right shoulder: Secondary | ICD-10-CM

## 2014-12-22 ENCOUNTER — Other Ambulatory Visit: Payer: Self-pay | Admitting: Orthopaedic Surgery

## 2014-12-22 DIAGNOSIS — M25511 Pain in right shoulder: Secondary | ICD-10-CM

## 2015-01-06 ENCOUNTER — Ambulatory Visit
Admission: RE | Admit: 2015-01-06 | Discharge: 2015-01-06 | Disposition: A | Discharge status: Home / Self Care | Payer: 59 | Source: Ambulatory Visit | Attending: Orthopaedic Surgery | Admitting: Orthopaedic Surgery

## 2015-01-06 DIAGNOSIS — M25511 Pain in right shoulder: Secondary | ICD-10-CM

## 2015-01-09 ENCOUNTER — Other Ambulatory Visit (HOSPITAL_COMMUNITY): Payer: Self-pay | Admitting: Orthopaedic Surgery

## 2015-01-10 ENCOUNTER — Other Ambulatory Visit (HOSPITAL_COMMUNITY): Payer: Self-pay | Admitting: Orthopaedic Surgery

## 2015-01-10 DIAGNOSIS — M25511 Pain in right shoulder: Secondary | ICD-10-CM

## 2015-01-16 ENCOUNTER — Ambulatory Visit (HOSPITAL_COMMUNITY): Payer: 59

## 2015-01-22 ENCOUNTER — Ambulatory Visit (HOSPITAL_COMMUNITY): Admission: RE | Admit: 2015-01-22 | Payer: 59 | Source: Ambulatory Visit

## 2015-01-26 ENCOUNTER — Ambulatory Visit (HOSPITAL_COMMUNITY)
Admission: RE | Admit: 2015-01-26 | Discharge: 2015-01-26 | Disposition: A | Discharge status: Home / Self Care | Payer: 59 | Source: Ambulatory Visit | Attending: Orthopaedic Surgery | Admitting: Orthopaedic Surgery

## 2015-01-26 DIAGNOSIS — M25511 Pain in right shoulder: Secondary | ICD-10-CM | POA: Diagnosis present

## 2015-01-26 DIAGNOSIS — M75121 Complete rotator cuff tear or rupture of right shoulder, not specified as traumatic: Secondary | ICD-10-CM | POA: Diagnosis not present

## 2015-01-26 DIAGNOSIS — M75101 Unspecified rotator cuff tear or rupture of right shoulder, not specified as traumatic: Secondary | ICD-10-CM | POA: Insufficient documentation

## 2015-01-26 DIAGNOSIS — F40240 Claustrophobia: Secondary | ICD-10-CM | POA: Insufficient documentation

## 2015-01-26 MED ORDER — IOHEXOL 300 MG/ML  SOLN
50.0000 mL | Freq: Once | INTRAMUSCULAR | Status: AC | PRN
  Administered 2015-01-26: 4 mL via INTRA_ARTICULAR

## 2015-02-08 ENCOUNTER — Other Ambulatory Visit (HOSPITAL_COMMUNITY): Payer: Self-pay | Admitting: Orthopaedic Surgery

## 2015-02-19 NOTE — Pre-Procedure Instructions (Signed)
Vanessa [name]  02/19/2015   Your procedure is scheduled on: Tues, April 26 @ 3:20 PM  [facility] Admitting at 1:15 PM.  Call this number if you have problems the morning of surgery: [phone]   Remember:   Do not eat food or drink liquids after midnight.   Take these medicines the morning of surgery with A SIP OF WATER: Albuterol<Bring Your Inhaler With You>,QVAR,Pain Pill(if needed),Synthroid(Levothyroxine),Claritin(Loratadine),and Tramadol(Ultram)               No Goody's,BC's,Aleve,Aspirin,Ibuprofen,Fish Oil,or any Herbal Medications.   Do not wear jewelry, make-up or nail polish.  Do not wear lotions, powders, or perfumes.   Do not shave 48 hours prior to surgery.   Do not bring valuables to the hospital.  [facility] is not responsible                  for any belongings or valuables.               Contacts, dentures or bridgework may not be worn into surgery.  Leave suitcase in the car. After surgery it may be brought to your room.  For patients admitted to the hospital, discharge time is determined by your                treatment team.               Patients discharged the day of surgery will not be allowed to drive  home.    Special Instructions:  [name] - Preparing for Surgery  Before surgery, you can play an important role.  Because skin is not sterile, your skin needs to be as free of germs as possible.  You can reduce the number of germs on you skin by washing with CHG (chlorahexidine gluconate) soap before surgery.  CHG is an antiseptic cleaner which kills germs and bonds with the skin to continue killing germs even after washing.  Please DO NOT use if you have an allergy to CHG or antibacterial soaps.  If your skin becomes reddened/irritated stop using the CHG and inform your nurse when you arrive at Short Stay.  Do not shave (including legs and underarms) for at least 48 hours prior to the first CHG shower.  You may shave your  face.  Please follow these instructions carefully:   1.  Shower with CHG Soap the night before surgery and the                                morning of Surgery.  2.  If you choose to wash your hair, wash your hair first as usual with your       normal shampoo.  3.  After you shampoo, rinse your hair and body thoroughly to remove the                      Shampoo.  4.  Use CHG as you would any other liquid soap.  You can apply chg directly       to the skin and wash gently with scrungie or a clean washcloth.  5.  Apply the CHG Soap to your body ONLY FROM THE NECK DOWN.        Do not use on open wounds or open sores.  Avoid contact with your eyes,       ears, mouth and genitals (  private parts).  Wash genitals (private parts)       with your normal soap.  6.  Wash thoroughly, paying special attention to the area where your surgery        will be performed.  7.  Thoroughly rinse your body with warm water from the neck down.  8.  DO NOT shower/wash with your normal soap after using and rinsing off       the CHG Soap.  9.  Pat yourself dry with a clean towel.            10.  Wear clean pajamas.            11.  Place clean sheets on your bed the night of your first shower and do not        sleep with pets.  Day of Surgery  Do not apply any lotions/deoderants the morning of surgery.  Please wear clean clothes to the hospital/surgery center.     Please read over the following fact sheets that you were given: Pain Booklet, Coughing and Deep Breathing and Surgical Site Infection Prevention

## 2015-02-20 ENCOUNTER — Encounter (HOSPITAL_COMMUNITY): Payer: Self-pay

## 2015-02-20 ENCOUNTER — Encounter (HOSPITAL_COMMUNITY)
Admission: RE | Admit: 2015-02-20 | Discharge: 2015-02-20 | Disposition: A | Discharge status: Home / Self Care | Payer: 59 | Source: Ambulatory Visit | Attending: Orthopaedic Surgery | Admitting: Orthopaedic Surgery

## 2015-02-20 ENCOUNTER — Other Ambulatory Visit (HOSPITAL_COMMUNITY): Payer: Self-pay

## 2015-02-20 DIAGNOSIS — M75121 Complete rotator cuff tear or rupture of right shoulder, not specified as traumatic: Secondary | ICD-10-CM | POA: Insufficient documentation

## 2015-02-20 DIAGNOSIS — Z01818 Encounter for other preprocedural examination: Secondary | ICD-10-CM | POA: Diagnosis not present

## 2015-02-20 DIAGNOSIS — J45909 Unspecified asthma, uncomplicated: Secondary | ICD-10-CM | POA: Insufficient documentation

## 2015-02-20 DIAGNOSIS — Z01810 Encounter for preprocedural cardiovascular examination: Secondary | ICD-10-CM | POA: Insufficient documentation

## 2015-02-20 DIAGNOSIS — I10 Essential (primary) hypertension: Secondary | ICD-10-CM | POA: Diagnosis not present

## 2015-02-20 DIAGNOSIS — Z01812 Encounter for preprocedural laboratory examination: Secondary | ICD-10-CM | POA: Insufficient documentation

## 2015-02-20 DIAGNOSIS — E890 Postprocedural hypothyroidism: Secondary | ICD-10-CM | POA: Diagnosis not present

## 2015-02-20 HISTORY — DX: Sciatica, unspecified side: M54.30

## 2015-02-20 HISTORY — DX: Headache, unspecified: R51.9

## 2015-02-20 HISTORY — DX: Other complications of anesthesia, initial encounter: T88.59XA

## 2015-02-20 HISTORY — DX: Headache: R51

## 2015-02-20 HISTORY — DX: Adverse effect of unspecified anesthetic, initial encounter: T41.45XA

## 2015-02-20 LAB — CBC
HEMATOCRIT: 38.9 % (ref 36.0–46.0)
Hemoglobin: 12.5 g/dL (ref 12.0–15.0)
MCH: 27.6 pg (ref 26.0–34.0)
MCHC: 32.1 g/dL (ref 30.0–36.0)
MCV: 85.9 fL (ref 78.0–100.0)
Platelets: 281 10*3/uL (ref 150–400)
RBC: 4.53 MIL/uL (ref 3.87–5.11)
RDW: 16.6 % — ABNORMAL HIGH (ref 11.5–15.5)
WBC: 9.8 10*3/uL (ref 4.0–10.5)

## 2015-02-20 LAB — BASIC METABOLIC PANEL
Anion gap: 6 (ref 5–15)
BUN: 13 mg/dL (ref 6–23)
CO2: 27 mmol/L (ref 19–32)
CREATININE: 0.85 mg/dL (ref 0.50–1.10)
Calcium: 8.9 mg/dL (ref 8.4–10.5)
Chloride: 104 mmol/L (ref 96–112)
GFR calc non Af Amer: 83 mL/min — ABNORMAL LOW (ref >90)
GLUCOSE: 99 mg/dL (ref 70–99)
Potassium: 4.1 mmol/L (ref 3.5–5.1)
Sodium: 137 mmol/L (ref 135–145)

## 2015-02-20 LAB — HCG, SERUM, QUALITATIVE: Preg, Serum: NEGATIVE

## 2015-02-20 NOTE — Progress Notes (Signed)
   02/20/15 0918  OBSTRUCTIVE SLEEP APNEA  Have you ever been diagnosed with sleep apnea through a sleep study? No  Do you snore loudly (loud enough to be heard through closed doors)?  0  Do you often feel tired, fatigued, or sleepy during the daytime? 1  Has anyone observed you stop breathing during your sleep? 1  Do you have, or are you being treated for high blood pressure? 1  BMI more than 35 kg/m2? 1  Age over 43 years old? 0  Neck circumference greater than 40 cm/16 inches? 1  Gender: 0

## 2015-02-20 NOTE — Progress Notes (Signed)
NOTIFIED Vanessa [name] OF PATIENT STATING SHE WOKE UP DURING SHOULDER SURGERY IN 2003 AT [facility]. PATIENT STATED SHE DID FINE WITH LAST SURGERY (THYROID SURGERY) 2012 [facility].  RECORDS HAVE BEEN REQUESTED FROM BOTH HOSPITALS.

## 2015-02-21 NOTE — Progress Notes (Addendum)
Anesthesia Chart Review:  Patient is a 44 year old female scheduled for right shoulder arthroscopy with rotator cuff repair on 02/27/15 by Dr. Doneen Poissonhristopher Blackman.  History includes non-smoker, asthma, HTN, thyroid surgery with hypothyroidism. OSA screening score was 5. BMI is consistent with morbid obesity. PCP is Dr. Oliver BarreJames John.  For anesthesia history she reported waking up during a shoulder procedure in 2003 at [facility], but did fine with thyroid surgery at [facility] in [place] in 2012. Both anesthesia records requested, but are still pending.  02/20/15 EKG: NSR.  Preoperative labs noted.   I've asked nursing staff to have me review outside records once received, otherwise she will be evaluated by her assigned anesthesiologist on the day of surgery to discuss the definitive anesthesia plan.  Velna Ochsllison Zenon Leaf, PA-C [facility] Short Stay [facility]/Anesthesiology Phone [phone] 02/21/2015 1:48 PM  Addendum: Received anesthesia records from 02/2002 [facility]. Right interscalene block and GA were used from right shoulder acromioplasty.  Handwritten note for post-operative anesthesia visit is difficult to read, but note is available to anesthesiologist review as needed.  For thyroidectomy on 12/23/10 at [facility], glide with stylet was used to place 7.0 ETT.   Anesthesiologist to evaluation on arrival.  Velna Ochsllison Merary Garguilo, PA-C [facility] Short Stay Center/Anesthesiology Phone [phone] 02/26/2015 2:45 PM

## 2015-02-26 MED ORDER — DEXTROSE 5 % IV SOLN
3.0000 g | INTRAVENOUS | Status: DC
  Filled 2015-02-26: qty 3000

## 2015-02-26 NOTE — Progress Notes (Signed)
Left message for time change

## 2015-02-26 NOTE — Progress Notes (Addendum)
Spoke with [facility] records at [facility], they will fax records they received to [phone]. Re-requested anesthesia/ or records from [facility].

## 2015-02-27 ENCOUNTER — Ambulatory Visit (HOSPITAL_COMMUNITY)
Admission: RE | Admit: 2015-02-27 | Discharge: 2015-02-28 | Disposition: A | Discharge status: Home / Self Care | Payer: 59 | Source: Ambulatory Visit | Attending: Orthopaedic Surgery | Admitting: Orthopaedic Surgery

## 2015-02-27 ENCOUNTER — Encounter (HOSPITAL_COMMUNITY)
Admission: RE | Disposition: A | Discharge status: Home / Self Care | Payer: Self-pay | Source: Ambulatory Visit | Attending: Orthopaedic Surgery

## 2015-02-27 ENCOUNTER — Ambulatory Visit (HOSPITAL_COMMUNITY): Payer: 59 | Admitting: Vascular Surgery

## 2015-02-27 ENCOUNTER — Ambulatory Visit (HOSPITAL_COMMUNITY): Payer: 59 | Admitting: Certified Registered Nurse Anesthetist

## 2015-02-27 ENCOUNTER — Encounter (HOSPITAL_COMMUNITY): Payer: Self-pay | Admitting: Certified Registered Nurse Anesthetist

## 2015-02-27 DIAGNOSIS — J45909 Unspecified asthma, uncomplicated: Secondary | ICD-10-CM | POA: Insufficient documentation

## 2015-02-27 DIAGNOSIS — I10 Essential (primary) hypertension: Secondary | ICD-10-CM | POA: Insufficient documentation

## 2015-02-27 DIAGNOSIS — I252 Old myocardial infarction: Secondary | ICD-10-CM | POA: Diagnosis not present

## 2015-02-27 DIAGNOSIS — E039 Hypothyroidism, unspecified: Secondary | ICD-10-CM | POA: Insufficient documentation

## 2015-02-27 DIAGNOSIS — M75121 Complete rotator cuff tear or rupture of right shoulder, not specified as traumatic: Secondary | ICD-10-CM

## 2015-02-27 DIAGNOSIS — I509 Heart failure, unspecified: Secondary | ICD-10-CM | POA: Diagnosis not present

## 2015-02-27 DIAGNOSIS — Z6841 Body Mass Index (BMI) 40.0 and over, adult: Secondary | ICD-10-CM | POA: Diagnosis not present

## 2015-02-27 DIAGNOSIS — Z9889 Other specified postprocedural states: Secondary | ICD-10-CM

## 2015-02-27 HISTORY — DX: Unspecified osteoarthritis, unspecified site: M19.90

## 2015-02-27 HISTORY — DX: Low back pain, unspecified: M54.50

## 2015-02-27 HISTORY — DX: Other chronic pain: G89.29

## 2015-02-27 HISTORY — PX: SHOULDER ARTHROSCOPY WITH ROTATOR CUFF REPAIR AND SUBACROMIAL DECOMPRESSION: SHX5686

## 2015-02-27 HISTORY — DX: Low back pain: M54.5

## 2015-02-27 HISTORY — DX: Anemia, unspecified: D64.9

## 2015-02-27 SURGERY — SHOULDER ARTHROSCOPY WITH ROTATOR CUFF REPAIR AND SUBACROMIAL DECOMPRESSION
Anesthesia: Regional | Site: Shoulder | Laterality: Right

## 2015-02-27 MED ORDER — DEXTROSE 5 % IV SOLN
500.0000 mg | Freq: Four times a day (QID) | INTRAVENOUS | Status: DC | PRN
  Filled 2015-02-27: qty 5

## 2015-02-27 MED ORDER — FENTANYL CITRATE (PF) 100 MCG/2ML IJ SOLN
INTRAMUSCULAR | Status: AC
  Administered 2015-02-27: 50 ug via INTRAVENOUS
  Filled 2015-02-27: qty 2

## 2015-02-27 MED ORDER — MIDAZOLAM HCL 2 MG/2ML IJ SOLN
INTRAMUSCULAR | Status: AC
  Filled 2015-02-27: qty 2

## 2015-02-27 MED ORDER — ONDANSETRON HCL 4 MG/2ML IJ SOLN
4.0000 mg | Freq: Four times a day (QID) | INTRAMUSCULAR | Status: DC | PRN
  Administered 2015-02-27: 4 mg via INTRAVENOUS

## 2015-02-27 MED ORDER — CEFAZOLIN SODIUM 1-5 GM-% IV SOLN
1.0000 g | Freq: Four times a day (QID) | INTRAVENOUS | Status: DC
  Administered 2015-02-28 (×2): 1 g via INTRAVENOUS
  Filled 2015-02-27 (×3): qty 50

## 2015-02-27 MED ORDER — ROPIVACAINE HCL 5 MG/ML IJ SOLN
INTRAMUSCULAR | Status: DC | PRN
  Administered 2015-02-27: 20 mL via PERINEURAL

## 2015-02-27 MED ORDER — ONDANSETRON HCL 4 MG/2ML IJ SOLN
INTRAMUSCULAR | Status: AC
  Filled 2015-02-27: qty 2

## 2015-02-27 MED ORDER — ONDANSETRON HCL 4 MG/2ML IJ SOLN
INTRAMUSCULAR | Status: DC | PRN
  Administered 2015-02-27: 4 mg via INTRAVENOUS

## 2015-02-27 MED ORDER — STERILE WATER FOR INJECTION IJ SOLN
INTRAMUSCULAR | Status: AC
  Filled 2015-02-27: qty 10

## 2015-02-27 MED ORDER — LOSARTAN POTASSIUM 50 MG PO TABS
100.0000 mg | ORAL_TABLET | Freq: Every day | ORAL | Status: DC
  Administered 2015-02-27: 100 mg via ORAL
  Filled 2015-02-27 (×2): qty 2

## 2015-02-27 MED ORDER — ROCURONIUM BROMIDE 100 MG/10ML IV SOLN
INTRAVENOUS | Status: DC | PRN
  Administered 2015-02-27: 10 mg via INTRAVENOUS

## 2015-02-27 MED ORDER — PROPOFOL 10 MG/ML IV BOLUS
INTRAVENOUS | Status: AC
  Filled 2015-02-27: qty 20

## 2015-02-27 MED ORDER — ACETAMINOPHEN 650 MG RE SUPP: 650.0000 mg | Freq: Four times a day (QID) | RECTAL | Status: DC | PRN

## 2015-02-27 MED ORDER — MIDAZOLAM HCL 2 MG/2ML IJ SOLN
INTRAMUSCULAR | Status: AC
  Administered 2015-02-27: 2 mg via INTRAVENOUS
  Filled 2015-02-27: qty 2

## 2015-02-27 MED ORDER — FENTANYL CITRATE (PF) 100 MCG/2ML IJ SOLN
50.0000 ug | INTRAMUSCULAR | Status: DC | PRN
  Administered 2015-02-27: 100 ug via INTRAVENOUS
  Administered 2015-02-27: 50 ug via INTRAVENOUS

## 2015-02-27 MED ORDER — CEFAZOLIN SODIUM 10 G IJ SOLR
3.0000 g | INTRAMUSCULAR | Status: DC | PRN
  Administered 2015-02-27: 3 g via INTRAVENOUS

## 2015-02-27 MED ORDER — ALBUTEROL SULFATE (2.5 MG/3ML) 0.083% IN NEBU: 2.0000 mL | INHALATION_SOLUTION | Freq: Four times a day (QID) | RESPIRATORY_TRACT | Status: DC | PRN

## 2015-02-27 MED ORDER — GLYCOPYRROLATE 0.2 MG/ML IJ SOLN
INTRAMUSCULAR | Status: AC
  Filled 2015-02-27: qty 2

## 2015-02-27 MED ORDER — NEOSTIGMINE METHYLSULFATE 10 MG/10ML IV SOLN
INTRAVENOUS | Status: DC | PRN
  Administered 2015-02-27: 3 mg via INTRAVENOUS

## 2015-02-27 MED ORDER — SODIUM CHLORIDE 0.9 % IV SOLN
INTRAVENOUS | Status: DC
  Administered 2015-02-27: 20:00:00 via INTRAVENOUS

## 2015-02-27 MED ORDER — OXYCODONE HCL 5 MG PO TABS: 5.0000 mg | ORAL_TABLET | Freq: Once | ORAL | Status: DC | PRN

## 2015-02-27 MED ORDER — MIDAZOLAM HCL 2 MG/2ML IJ SOLN
INTRAMUSCULAR | Status: AC
  Administered 2015-02-27: 1 mg via INTRAVENOUS
  Filled 2015-02-27: qty 2

## 2015-02-27 MED ORDER — HYDROMORPHONE HCL 1 MG/ML IJ SOLN
1.0000 mg | INTRAMUSCULAR | Status: DC | PRN
  Administered 2015-02-28: 1 mg via INTRAVENOUS
  Filled 2015-02-27: qty 1

## 2015-02-27 MED ORDER — PROPOFOL 10 MG/ML IV BOLUS
INTRAVENOUS | Status: DC | PRN
  Administered 2015-02-27: 160 mg via INTRAVENOUS

## 2015-02-27 MED ORDER — GLYCOPYRROLATE 0.2 MG/ML IJ SOLN
INTRAMUSCULAR | Status: DC | PRN
  Administered 2015-02-27: 0.4 mg via INTRAVENOUS

## 2015-02-27 MED ORDER — CELECOXIB 200 MG PO CAPS
200.0000 mg | ORAL_CAPSULE | Freq: Two times a day (BID) | ORAL | Status: DC
  Administered 2015-02-27 – 2015-02-28 (×2): 200 mg via ORAL
  Filled 2015-02-27 (×3): qty 1

## 2015-02-27 MED ORDER — OXYCODONE HCL 5 MG/5ML PO SOLN: 5.0000 mg | Freq: Once | ORAL | Status: DC | PRN

## 2015-02-27 MED ORDER — PHENYLEPHRINE HCL 10 MG/ML IJ SOLN
10.0000 mg | INTRAMUSCULAR | Status: DC | PRN
  Administered 2015-02-27: 20 ug/min via INTRAVENOUS

## 2015-02-27 MED ORDER — MIDAZOLAM HCL 2 MG/2ML IJ SOLN
1.0000 mg | INTRAMUSCULAR | Status: DC | PRN
  Administered 2015-02-27: 2 mg via INTRAVENOUS
  Administered 2015-02-27: 1 mg via INTRAVENOUS

## 2015-02-27 MED ORDER — LIDOCAINE HCL (CARDIAC) 20 MG/ML IV SOLN
INTRAVENOUS | Status: DC | PRN
  Administered 2015-02-27: 80 mg via INTRAVENOUS

## 2015-02-27 MED ORDER — LIDOCAINE HCL (CARDIAC) 20 MG/ML IV SOLN
INTRAVENOUS | Status: AC
  Filled 2015-02-27: qty 5

## 2015-02-27 MED ORDER — HYDROCHLOROTHIAZIDE 25 MG PO TABS
25.0000 mg | ORAL_TABLET | Freq: Every day | ORAL | Status: DC
  Administered 2015-02-27: 25 mg via ORAL
  Filled 2015-02-27 (×2): qty 1

## 2015-02-27 MED ORDER — FENTANYL CITRATE (PF) 100 MCG/2ML IJ SOLN
INTRAMUSCULAR | Status: AC
  Filled 2015-02-27: qty 2

## 2015-02-27 MED ORDER — LACTATED RINGERS IV SOLN
INTRAVENOUS | Status: DC | PRN
  Administered 2015-02-27 (×2): via INTRAVENOUS

## 2015-02-27 MED ORDER — DEXAMETHASONE SODIUM PHOSPHATE 4 MG/ML IJ SOLN
INTRAMUSCULAR | Status: AC
  Administered 2015-02-27: 4 mg via PERINEURAL
  Filled 2015-02-27: qty 1

## 2015-02-27 MED ORDER — METOCLOPRAMIDE HCL 5 MG/ML IJ SOLN
5.0000 mg | Freq: Three times a day (TID) | INTRAMUSCULAR | Status: DC | PRN
  Filled 2015-02-27: qty 2

## 2015-02-27 MED ORDER — METHOCARBAMOL 500 MG PO TABS
500.0000 mg | ORAL_TABLET | Freq: Four times a day (QID) | ORAL | Status: DC | PRN
  Filled 2015-02-27: qty 1

## 2015-02-27 MED ORDER — FENTANYL CITRATE (PF) 250 MCG/5ML IJ SOLN
INTRAMUSCULAR | Status: AC
  Filled 2015-02-27: qty 5

## 2015-02-27 MED ORDER — ONDANSETRON HCL 4 MG PO TABS: 4.0000 mg | ORAL_TABLET | Freq: Four times a day (QID) | ORAL | Status: DC | PRN

## 2015-02-27 MED ORDER — OXYCODONE HCL 5 MG PO TABS
5.0000 mg | ORAL_TABLET | ORAL | Status: DC | PRN
  Administered 2015-02-28: 5 mg via ORAL
  Administered 2015-02-28: 10 mg via ORAL
  Filled 2015-02-27: qty 1
  Filled 2015-02-27: qty 2

## 2015-02-27 MED ORDER — SODIUM CHLORIDE 0.9 % IR SOLN
Status: DC | PRN
Start: 2015-02-27 — End: 2015-02-27
  Administered 2015-02-27: 6000 mL

## 2015-02-27 MED ORDER — SUCCINYLCHOLINE CHLORIDE 20 MG/ML IJ SOLN
INTRAMUSCULAR | Status: DC | PRN
  Administered 2015-02-27: 60 mg via INTRAVENOUS

## 2015-02-27 MED ORDER — MENTHOL 3 MG MT LOZG
1.0000 | LOZENGE | OROMUCOSAL | Status: DC | PRN
  Administered 2015-02-27: 3 mg via ORAL
  Filled 2015-02-27: qty 9

## 2015-02-27 MED ORDER — LEVOTHYROXINE SODIUM 175 MCG PO TABS
175.0000 ug | ORAL_TABLET | Freq: Every day | ORAL | Status: DC
  Administered 2015-02-28: 175 ug via ORAL
  Filled 2015-02-27 (×2): qty 1

## 2015-02-27 MED ORDER — FENTANYL CITRATE (PF) 100 MCG/2ML IJ SOLN: 25.0000 ug | INTRAMUSCULAR | Status: DC | PRN

## 2015-02-27 MED ORDER — METOCLOPRAMIDE HCL 10 MG PO TABS: 5.0000 mg | ORAL_TABLET | Freq: Three times a day (TID) | ORAL | Status: DC | PRN

## 2015-02-27 MED ORDER — FENTANYL CITRATE (PF) 100 MCG/2ML IJ SOLN
INTRAMUSCULAR | Status: AC
  Administered 2015-02-27: 100 ug via INTRAVENOUS
  Filled 2015-02-27: qty 2

## 2015-02-27 MED ORDER — FENTANYL CITRATE (PF) 100 MCG/2ML IJ SOLN
INTRAMUSCULAR | Status: AC
Start: 2015-02-27 — End: 2015-02-27
  Administered 2015-02-27: 100 ug via INTRAVENOUS
  Filled 2015-02-27: qty 2

## 2015-02-27 MED ORDER — ACETAMINOPHEN 325 MG PO TABS: 650.0000 mg | ORAL_TABLET | Freq: Four times a day (QID) | ORAL | Status: DC | PRN

## 2015-02-27 MED ORDER — SUCCINYLCHOLINE CHLORIDE 20 MG/ML IJ SOLN
INTRAMUSCULAR | Status: AC
  Filled 2015-02-27: qty 1

## 2015-02-27 MED ORDER — ROCURONIUM BROMIDE 50 MG/5ML IV SOLN
INTRAVENOUS | Status: AC
Start: 2015-02-27 — End: 2015-02-27
  Filled 2015-02-27: qty 1

## 2015-02-27 MED ORDER — DIPHENHYDRAMINE HCL 12.5 MG/5ML PO ELIX
12.5000 mg | ORAL_SOLUTION | ORAL | Status: DC | PRN
  Filled 2015-02-27: qty 10

## 2015-02-27 MED ORDER — EPHEDRINE SULFATE 50 MG/ML IJ SOLN
INTRAMUSCULAR | Status: AC
  Filled 2015-02-27: qty 1

## 2015-02-27 SURGICAL SUPPLY — 49 items
BLADE CUDA 4.2 (BLADE) ×2 IMPLANT
BLADE SURG 11 STRL SS (BLADE) ×2 IMPLANT
BLADE SURG ROTATE 9660 (MISCELLANEOUS) IMPLANT
BUR VERTEX HOODED 4.5 (BURR) ×2 IMPLANT
CANNULA 5.75X7 CRYSTAL CLEAR (CANNULA) ×2 IMPLANT
CANNULA SHOULDER 7CM (CANNULA) ×4 IMPLANT
CANNULA TWIST IN 8.25X7CM (CANNULA) ×2 IMPLANT
COVER SURGICAL LIGHT HANDLE (MISCELLANEOUS) ×2 IMPLANT
DECANTER SPIKE VIAL GLASS SM (MISCELLANEOUS) IMPLANT
DRAPE INCISE IOBAN 66X45 STRL (DRAPES) IMPLANT
DRAPE SHOULDER BEACH CHAIR (DRAPES) ×2 IMPLANT
DRAPE SURG 17X23 STRL (DRAPES) ×2 IMPLANT
DRAPE U-SHAPE 47X51 STRL (DRAPES) ×2 IMPLANT
DRSG PAD ABDOMINAL 8X10 ST (GAUZE/BANDAGES/DRESSINGS) ×4 IMPLANT
DURAPREP 26ML APPLICATOR (WOUND CARE) ×2 IMPLANT
ELECT REM PT RETURN 9FT ADLT (ELECTROSURGICAL)
ELECTRODE REM PT RTRN 9FT ADLT (ELECTROSURGICAL) IMPLANT
GAUZE SPONGE 4X4 12PLY STRL (GAUZE/BANDAGES/DRESSINGS) ×2 IMPLANT
GAUZE XEROFORM 1X8 LF (GAUZE/BANDAGES/DRESSINGS) ×2 IMPLANT
GLOVE BIO SURGEON STRL SZ8 (GLOVE) ×2 IMPLANT
GLOVE BIOGEL PI IND STRL 8 (GLOVE) ×1 IMPLANT
GLOVE BIOGEL PI INDICATOR 8 (GLOVE) ×1
GLOVE ORTHO TXT STRL SZ7.5 (GLOVE) ×2 IMPLANT
GOWN STRL REUS W/ TWL LRG LVL3 (GOWN DISPOSABLE) ×2 IMPLANT
GOWN STRL REUS W/ TWL XL LVL3 (GOWN DISPOSABLE) ×4 IMPLANT
GOWN STRL REUS W/TWL LRG LVL3 (GOWN DISPOSABLE) ×2
GOWN STRL REUS W/TWL XL LVL3 (GOWN DISPOSABLE) ×4
KIT BASIN OR (CUSTOM PROCEDURE TRAY) ×2 IMPLANT
KIT ROOM TURNOVER OR (KITS) ×2 IMPLANT
KIT SHOULDER TRACTION (DRAPES) ×2 IMPLANT
MANIFOLD NEPTUNE II (INSTRUMENTS) ×2 IMPLANT
NEEDLE 1/2 CIR CATGUT .05X1.09 (NEEDLE) IMPLANT
NEEDLE HYPO 25GX1X1/2 BEV (NEEDLE) IMPLANT
NEEDLE SCORPION (NEEDLE) ×2 IMPLANT
NEEDLE SPNL 18GX3.5 QUINCKE PK (NEEDLE) ×2 IMPLANT
NS IRRIG 1000ML POUR BTL (IV SOLUTION) ×2 IMPLANT
PACK SHOULDER (CUSTOM PROCEDURE TRAY) ×2 IMPLANT
PAD ARMBOARD 7.5X6 YLW CONV (MISCELLANEOUS) ×4 IMPLANT
SET ARTHROSCOPY TUBING (MISCELLANEOUS) ×1
SET ARTHROSCOPY TUBING LN (MISCELLANEOUS) ×1 IMPLANT
SPONGE LAP 4X18 X RAY DECT (DISPOSABLE) ×2 IMPLANT
STAPLER VISISTAT 35W (STAPLE) IMPLANT
STRIP CLOSURE SKIN 1/2X4 (GAUZE/BANDAGES/DRESSINGS) IMPLANT
SUT ETHILON 3 0 PS 1 (SUTURE) IMPLANT
SYR CONTROL 10ML LL (SYRINGE) IMPLANT
TOWEL OR 17X24 6PK STRL BLUE (TOWEL DISPOSABLE) ×2 IMPLANT
TOWEL OR 17X26 10 PK STRL BLUE (TOWEL DISPOSABLE) ×2 IMPLANT
WAND HAND CNTRL MULTIVAC 90 (MISCELLANEOUS) ×2 IMPLANT
WATER STERILE IRR 1000ML POUR (IV SOLUTION) ×2 IMPLANT

## 2015-02-27 NOTE — Transfer of Care (Signed)
Immediate Anesthesia Transfer of Care Note  Patient: Vanessa [name]  Procedure(s) Performed: Procedure(s): RIGHT SHOULDER ARTHROSCOPY WITH ROTATOR CUFF REPAIR AND SUBACROMIAL DECOMPRESSION (Right)  Patient Location: PACU  Anesthesia Type:General and Regional  Level of Consciousness: awake, alert  and oriented  Airway & Oxygen Therapy: Patient Spontanous Breathing and Patient connected to nasal cannula oxygen  Post-op Assessment: Report given to RN and Post -op Vital signs reviewed and stable  Post vital signs: Reviewed and stable  Last Vitals:  Filed Vitals:   02/27/15 1540  BP: 92/77  Pulse: 93  Temp:   Resp: 23    Complications: No apparent anesthesia complications

## 2015-02-27 NOTE — Anesthesia Procedure Notes (Addendum)
Procedure Name: Intubation Date/Time: 02/27/2015 4:03 PM Performed by: Rise PatienceBELL, SARAH T Pre-anesthesia Checklist: Patient identified, Emergency Drugs available, Suction available and Patient being monitored Patient Re-evaluated:Patient Re-evaluated prior to inductionOxygen Delivery Method: Circle system utilized Preoxygenation: Pre-oxygenation with 100% oxygen Intubation Type: IV induction and Rapid sequence Ventilation: Mask ventilation without difficulty and Oral airway inserted - appropriate to patient size Laryngoscope Size: Hyacinth MeekerMiller and 2 Grade View: Grade I Tube type: Oral Number of attempts: 1 Airway Equipment and Method: Stylet and Oral airway Placement Confirmation: ETT inserted through vocal cords under direct vision,  positive ETCO2 and breath sounds checked- equal and bilateral Secured at: 22 cm Tube secured with: Tape Dental Injury: Teeth and Oropharynx as per pre-operative assessment    Anesthesia Regional Block:  Interscalene brachial plexus block  Pre-Anesthetic Checklist: ,, timeout performed, Correct Patient, Correct Site, Correct Laterality, Correct Procedure, Correct Position, site marked, Risks and benefits discussed,  Surgical consent,  Pre-op evaluation,  At surgeon's request and post-op pain management  Laterality: Upper and Right  Prep: chloraprep       Needles:  Injection technique: Single-shot  Needle Type: Echogenic Stimulator Needle          Additional Needles:  Procedures: ultrasound guided (picture in chart) and nerve stimulator Interscalene brachial plexus block  Nerve Stimulator or Paresthesia:  Response: deltoid, 0.5 mA,   Additional Responses:   Narrative:  Injection made incrementally with aspirations every 5 mL.  Performed by: Personally  Anesthesiologist: Earsie Humm, CHRIS  Additional Notes: H+P and labs reviewed, risks and benefits discussed with patient, procedure tolerated well without complications

## 2015-02-27 NOTE — Anesthesia Postprocedure Evaluation (Signed)
  Anesthesia Post-op Note  Patient: Vanessa [name]  Procedure(s) Performed: Procedure(s): RIGHT SHOULDER ARTHROSCOPY WITH ROTATOR CUFF REPAIR AND SUBACROMIAL DECOMPRESSION (Right)  Patient Location: PACU  Anesthesia Type: General, Regional   Level of Consciousness: awake, alert  and oriented  Airway and Oxygen Therapy: Patient Spontanous Breathing  Post-op Pain: none  Post-op Assessment: Post-op Vital signs reviewed  Post-op Vital Signs: Reviewed  Last Vitals:  Filed Vitals:   02/27/15 1833  BP: 112/56  Pulse: 63  Temp: 36.9 C  Resp: 16    Complications: No apparent anesthesia complications

## 2015-02-27 NOTE — Progress Notes (Signed)
Pt from pacu, admission nurse called, pt stable

## 2015-02-27 NOTE — Anesthesia Preprocedure Evaluation (Addendum)
Anesthesia Evaluation  Patient identified by MRN, date of birth, ID band Patient awake    Reviewed: Allergy & Precautions, NPO status , Patient's Chart, lab work & pertinent test results  History of Anesthesia Complications (+) AWARENESS UNDER ANESTHESIA and history of anesthetic complications  Airway Mallampati: II  TM Distance: >3 FB Neck ROM: Full    Dental  (+) Dental Advisory Given, Teeth Intact,    Pulmonary asthma , neg sleep apnea, neg recent URI,  breath sounds clear to auscultation        Cardiovascular hypertension, Pt. on medications - angina- Past MI and - CHF Rhythm:Regular     Neuro/Psych  Headaches,  Neuromuscular disease negative psych ROS   GI/Hepatic negative GI ROS, Neg liver ROS,   Endo/Other  Hypothyroidism Morbid obesity  Renal/GU negative Renal ROS     Musculoskeletal   Abdominal   Peds  Hematology negative hematology ROS (+)   Anesthesia Other Findings   Reproductive/Obstetrics                           Anesthesia Physical Anesthesia Plan  ASA: III  Anesthesia Plan: General and Regional   Post-op Pain Management:    Induction: Intravenous  Airway Management Planned: Oral ETT  Additional Equipment: None  Intra-op Plan:   Post-operative Plan: Extubation in OR  Informed Consent: I have reviewed the patients History and Physical, chart, labs and discussed the procedure including the risks, benefits and alternatives for the proposed anesthesia with the patient or authorized representative who has indicated his/her understanding and acceptance.   Dental advisory given  Plan Discussed with: CRNA, Anesthesiologist and Surgeon  Anesthesia Plan Comments:        Anesthesia Quick Evaluation
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
## 2015-02-27 NOTE — Brief Op Note (Signed)
02/27/2015  5:00 PM  PATIENT:  Benay PillowStacey C Alcon  44 y.o. female  PRE-OPERATIVE DIAGNOSIS:  right shoulder full-thickness rotator cuff tear  POST-OPERATIVE DIAGNOSIS:  Right Shoulder Full thickness rotator cuff tear  PROCEDURE:  Right shoulder arthroscopy with extensive debridement  SURGEON:  Surgeon(s) and Role:    * Kathryne Hitchhristopher Y Yassmine Tamm, MD - Primary  PHYSICIAN ASSISTANT: Rexene EdisonGil Clark, PA-C  ANESTHESIA:   regional and general  EBL:  Total I/O In: 1000 [I.V.:1000] Out: -   BLOOD ADMINISTERED:none  DRAINS: none   LOCAL MEDICATIONS USED:  NONE  SPECIMEN:  No Specimen  DISPOSITION OF SPECIMEN:  N/A  COUNTS:  YES  TOURNIQUET:  * No tourniquets in log *  DICTATION: .Other Dictation: Dictation Number [id number]  PLAN OF CARE: Admit for overnight observation  PATIENT DISPOSITION:  PACU - hemodynamically stable.   Delay start of Pharmacological VTE agent (>24hrs) due to surgical blood loss or risk of bleeding: not applicable

## 2015-02-28 ENCOUNTER — Encounter (HOSPITAL_COMMUNITY): Payer: Self-pay | Admitting: Orthopaedic Surgery

## 2015-02-28 DIAGNOSIS — M75121 Complete rotator cuff tear or rupture of right shoulder, not specified as traumatic: Secondary | ICD-10-CM | POA: Diagnosis not present

## 2015-02-28 MED ORDER — CELECOXIB 200 MG PO CAPS: 200.0000 mg | ORAL_CAPSULE | Freq: Two times a day (BID) | ORAL | Status: DC

## 2015-02-28 MED ORDER — OXYCODONE-ACETAMINOPHEN 5-325 MG PO TABS: 1.0000 | ORAL_TABLET | ORAL | Status: DC | PRN

## 2015-02-28 NOTE — Discharge Instructions (Signed)
Activities as tolerated right arm . Sling for comfort. May shower post-op day 2 dry incision area and apply new dressing after shower.
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
## 2015-02-28 NOTE — Progress Notes (Signed)
Subjective: 1 Day Post-Op Procedure(s) (LRB): RIGHT SHOULDER ARTHROSCOPY WITH ROTATOR CUFF REPAIR AND SUBACROMIAL DECOMPRESSION (Right) Patient reports pain as mild.  Still with some numbness radial nerve secondary to block.  Objective: Vital signs in last 24 hours: Temp:  [97.2 F (36.2 C)-98.5 F (36.9 C)] 97.9 F (36.6 C) (04/27 0501) Pulse Rate:  [62-98] 63 (04/27 0501) Resp:  [13-23] 18 (04/27 0501) BP: (91-122)/(47-93) 97/54 mmHg (04/27 0501) SpO2:  [90 %-100 %] 96 % (04/27 0501) Weight:  [154.767 kg (341 lb 3.2 oz)] 154.767 kg (341 lb 3.2 oz) (04/26 1316)  Intake/Output from previous day: 04/26 0701 - 04/27 0700 In: 1000 [I.V.:1000] Out: -  Intake/Output this shift:    No results for input(s): HGB in the last 72 hours. No results for input(s): WBC, RBC, HCT, PLT in the last 72 hours. No results for input(s): NA, K, CL, CO2, BUN, CREATININE, GLUCOSE, CALCIUM in the last 72 hours. No results for input(s): LABPT, INR in the last 72 hours.  Intact pulses distally Incision: moderate drainage Compartment soft No active drainage nylon suture approximate incision sites Assessment/Plan: 1 Day Post-Op Procedure(s) (LRB): RIGHT SHOULDER ARTHROSCOPY WITH ROTATOR CUFF REPAIR AND SUBACROMIAL DECOMPRESSION (Right) Discharge home with home health  Dressing changed Follow up with Dr. Magnus IvanBlackman in one week.  Richardean CanalCLARK, Trelon Plush 02/28/2015, 8:35 AM
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
## 2015-02-28 NOTE — Progress Notes (Signed)
Pt with discharge orders to home. All discharge instructions reviewed with patient. Pt with all belongings. Pt given Rx and discharge instructions. Pt with no further questions at this time. Pt stable upon discharge home.

## 2015-03-08 ENCOUNTER — Ambulatory Visit: Payer: No Typology Code available for payment source | Admitting: Physical Therapy

## 2015-03-21 ENCOUNTER — Ambulatory Visit: Payer: 59 | Attending: Orthopaedic Surgery

## 2015-03-21 DIAGNOSIS — M25511 Pain in right shoulder: Secondary | ICD-10-CM | POA: Insufficient documentation

## 2015-03-21 DIAGNOSIS — R29898 Other symptoms and signs involving the musculoskeletal system: Secondary | ICD-10-CM | POA: Diagnosis not present

## 2015-03-21 DIAGNOSIS — R293 Abnormal posture: Secondary | ICD-10-CM | POA: Insufficient documentation
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
## 2015-03-21 NOTE — Patient Instructions (Signed)
Reviewed posture and support of RT arm.  Started scapula retractions  And pendulums 6-8x/day, 5-10 reps Exercises form cabinet

## 2015-03-28 ENCOUNTER — Ambulatory Visit: Payer: 59

## 2015-03-28 DIAGNOSIS — R29898 Other symptoms and signs involving the musculoskeletal system: Secondary | ICD-10-CM

## 2015-03-28 DIAGNOSIS — M25511 Pain in right shoulder: Secondary | ICD-10-CM
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
## 2015-03-30 ENCOUNTER — Ambulatory Visit: Payer: 59

## 2015-03-30 DIAGNOSIS — M25511 Pain in right shoulder: Secondary | ICD-10-CM | POA: Diagnosis not present

## 2015-03-30 DIAGNOSIS — R29898 Other symptoms and signs involving the musculoskeletal system: Secondary | ICD-10-CM
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
## 2015-03-30 NOTE — Patient Instructions (Addendum)
Strengthening: Isometric Flexion  Using wall for resistance, press right fist into ball using light pressure. Hold ____ seconds. Repeat ____ times per set. Do ____ sets per session. Do ____ sessions per day.  SHOULDER: Abduction (Isometric)  Use wall as resistance. Press arm against pillow. Keep elbow straight. Hold ___ seconds. ___ reps per set, ___ sets per day, ___ days per week  Extension (Isometric)  Place left bent elbow and back of arm against wall. Press elbow against wall. Hold ____ seconds. Repeat ____ times. Do ____ sessions per day.  Internal Rotation (Isometric)  Place palm of right fist against door frame, with elbow bent. Press fist against door frame. Hold ____ seconds. Repeat ____ times. Do ____ sessions per day.  External Rotation (Isometric)  Place back of left fist against door frame, with elbow bent. Press fist against door frame. Hold ____ seconds. Repeat ____ times. Do ____ sessions per day.  Copyright  VHI. All rights reserved.   All exericses 5-10 sed hold, 10-20 reps. 1x/day   Min to no pain

## 2015-04-04 ENCOUNTER — Ambulatory Visit: Payer: 59 | Attending: Orthopaedic Surgery | Admitting: Physical Therapy

## 2015-04-04 DIAGNOSIS — R209 Unspecified disturbances of skin sensation: Secondary | ICD-10-CM

## 2015-04-04 DIAGNOSIS — R293 Abnormal posture: Secondary | ICD-10-CM | POA: Insufficient documentation

## 2015-04-04 DIAGNOSIS — R29898 Other symptoms and signs involving the musculoskeletal system: Secondary | ICD-10-CM | POA: Insufficient documentation

## 2015-04-04 DIAGNOSIS — R208 Other disturbances of skin sensation: Secondary | ICD-10-CM | POA: Insufficient documentation

## 2015-04-04 DIAGNOSIS — M542 Cervicalgia: Secondary | ICD-10-CM | POA: Insufficient documentation

## 2015-04-04 DIAGNOSIS — M25511 Pain in right shoulder: Secondary | ICD-10-CM | POA: Insufficient documentation
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
## 2015-04-09 ENCOUNTER — Ambulatory Visit: Payer: 59

## 2015-04-09 DIAGNOSIS — M25511 Pain in right shoulder: Secondary | ICD-10-CM

## 2015-04-09 DIAGNOSIS — R29898 Other symptoms and signs involving the musculoskeletal system: Secondary | ICD-10-CM

## 2015-04-09 DIAGNOSIS — M542 Cervicalgia: Secondary | ICD-10-CM
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
## 2015-04-11 ENCOUNTER — Ambulatory Visit: Payer: 59

## 2015-04-11 DIAGNOSIS — R29898 Other symptoms and signs involving the musculoskeletal system: Secondary | ICD-10-CM

## 2015-04-11 DIAGNOSIS — M25511 Pain in right shoulder: Secondary | ICD-10-CM
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
## 2015-04-16 ENCOUNTER — Ambulatory Visit: Payer: 59 | Admitting: Physical Therapy

## 2015-04-16 DIAGNOSIS — M542 Cervicalgia: Secondary | ICD-10-CM

## 2015-04-16 DIAGNOSIS — R209 Unspecified disturbances of skin sensation: Secondary | ICD-10-CM

## 2015-04-16 DIAGNOSIS — R29898 Other symptoms and signs involving the musculoskeletal system: Secondary | ICD-10-CM

## 2015-04-16 DIAGNOSIS — M25511 Pain in right shoulder: Secondary | ICD-10-CM

## 2015-04-16 DIAGNOSIS — R293 Abnormal posture: Secondary | ICD-10-CM
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
## 2015-04-18 ENCOUNTER — Ambulatory Visit: Payer: 59

## 2015-04-23 ENCOUNTER — Ambulatory Visit: Payer: 59

## 2015-04-23 DIAGNOSIS — R29898 Other symptoms and signs involving the musculoskeletal system: Secondary | ICD-10-CM

## 2015-04-23 DIAGNOSIS — M25511 Pain in right shoulder: Secondary | ICD-10-CM
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
## 2015-04-25 ENCOUNTER — Ambulatory Visit: Payer: 59

## 2015-04-25 DIAGNOSIS — M25511 Pain in right shoulder: Secondary | ICD-10-CM

## 2015-04-25 DIAGNOSIS — R29898 Other symptoms and signs involving the musculoskeletal system: Secondary | ICD-10-CM
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
## 2015-05-14 ENCOUNTER — Encounter: Payer: Self-pay | Admitting: Family Medicine

## 2015-05-14 ENCOUNTER — Ambulatory Visit: Payer: 59

## 2015-05-14 ENCOUNTER — Ambulatory Visit: Payer: 59 | Attending: Orthopaedic Surgery | Admitting: Physical Therapy

## 2015-05-14 DIAGNOSIS — R29898 Other symptoms and signs involving the musculoskeletal system: Secondary | ICD-10-CM | POA: Insufficient documentation

## 2015-05-14 DIAGNOSIS — R293 Abnormal posture: Secondary | ICD-10-CM | POA: Diagnosis present

## 2015-05-14 DIAGNOSIS — R208 Other disturbances of skin sensation: Secondary | ICD-10-CM | POA: Diagnosis present

## 2015-05-14 DIAGNOSIS — M25511 Pain in right shoulder: Secondary | ICD-10-CM | POA: Diagnosis present

## 2015-05-14 DIAGNOSIS — R209 Unspecified disturbances of skin sensation: Secondary | ICD-10-CM

## 2015-05-14 DIAGNOSIS — M542 Cervicalgia: Secondary | ICD-10-CM | POA: Insufficient documentation
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
## 2015-05-16 ENCOUNTER — Ambulatory Visit: Payer: 59

## 2015-05-16 DIAGNOSIS — R29898 Other symptoms and signs involving the musculoskeletal system: Secondary | ICD-10-CM | POA: Diagnosis not present

## 2015-05-16 DIAGNOSIS — M25511 Pain in right shoulder: Secondary | ICD-10-CM
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
## 2015-05-21 ENCOUNTER — Ambulatory Visit: Payer: 59

## 2015-05-21 DIAGNOSIS — R29898 Other symptoms and signs involving the musculoskeletal system: Secondary | ICD-10-CM

## 2015-05-21 DIAGNOSIS — M25511 Pain in right shoulder: Secondary | ICD-10-CM
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
## 2015-06-05 ENCOUNTER — Ambulatory Visit: Payer: 59 | Attending: Orthopaedic Surgery

## 2015-06-05 DIAGNOSIS — R293 Abnormal posture: Secondary | ICD-10-CM | POA: Diagnosis present

## 2015-06-05 DIAGNOSIS — R29898 Other symptoms and signs involving the musculoskeletal system: Secondary | ICD-10-CM | POA: Diagnosis not present

## 2015-06-05 DIAGNOSIS — M25511 Pain in right shoulder: Secondary | ICD-10-CM | POA: Insufficient documentation

## 2015-06-05 DIAGNOSIS — R208 Other disturbances of skin sensation: Secondary | ICD-10-CM | POA: Diagnosis present

## 2015-06-05 DIAGNOSIS — M542 Cervicalgia: Secondary | ICD-10-CM | POA: Diagnosis present
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
## 2015-06-08 ENCOUNTER — Ambulatory Visit: Payer: 59

## 2015-06-08 DIAGNOSIS — M25511 Pain in right shoulder: Secondary | ICD-10-CM

## 2015-06-08 DIAGNOSIS — R29898 Other symptoms and signs involving the musculoskeletal system: Secondary | ICD-10-CM
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
## 2015-06-19 ENCOUNTER — Ambulatory Visit: Payer: 59

## 2015-06-19 DIAGNOSIS — M25511 Pain in right shoulder: Secondary | ICD-10-CM

## 2015-06-19 DIAGNOSIS — R29898 Other symptoms and signs involving the musculoskeletal system: Secondary | ICD-10-CM | POA: Diagnosis not present
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
## 2015-06-22 ENCOUNTER — Ambulatory Visit: Payer: 59

## 2015-06-22 DIAGNOSIS — R29898 Other symptoms and signs involving the musculoskeletal system: Secondary | ICD-10-CM

## 2015-06-22 DIAGNOSIS — M25511 Pain in right shoulder: Secondary | ICD-10-CM
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
## 2015-06-25 ENCOUNTER — Ambulatory Visit: Payer: 59

## 2015-06-25 DIAGNOSIS — M25511 Pain in right shoulder: Secondary | ICD-10-CM

## 2015-06-25 DIAGNOSIS — R29898 Other symptoms and signs involving the musculoskeletal system: Secondary | ICD-10-CM
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
## 2015-06-27 ENCOUNTER — Ambulatory Visit: Payer: 59 | Admitting: Physical Therapy

## 2015-06-27 DIAGNOSIS — M25511 Pain in right shoulder: Secondary | ICD-10-CM

## 2015-06-27 DIAGNOSIS — R29898 Other symptoms and signs involving the musculoskeletal system: Secondary | ICD-10-CM

## 2015-06-27 DIAGNOSIS — R293 Abnormal posture: Secondary | ICD-10-CM
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
## 2015-06-28 ENCOUNTER — Other Ambulatory Visit: Payer: Self-pay | Admitting: Internal Medicine

## 2015-07-02 ENCOUNTER — Ambulatory Visit: Payer: 59 | Admitting: Physical Therapy

## 2015-07-02 ENCOUNTER — Other Ambulatory Visit: Payer: Self-pay | Admitting: Internal Medicine

## 2015-07-02 DIAGNOSIS — R29898 Other symptoms and signs involving the musculoskeletal system: Secondary | ICD-10-CM

## 2015-07-02 DIAGNOSIS — M25511 Pain in right shoulder: Secondary | ICD-10-CM
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
## 2015-07-02 NOTE — Patient Instructions (Signed)
Asked patient to bring in HEP for full review and to streamline.  Focus on strength

## 2015-07-04 ENCOUNTER — Ambulatory Visit: Payer: 59 | Admitting: Physical Therapy

## 2015-07-04 DIAGNOSIS — M542 Cervicalgia: Secondary | ICD-10-CM

## 2015-07-04 DIAGNOSIS — M25511 Pain in right shoulder: Secondary | ICD-10-CM

## 2015-07-04 DIAGNOSIS — R29898 Other symptoms and signs involving the musculoskeletal system: Secondary | ICD-10-CM | POA: Diagnosis not present

## 2015-07-04 DIAGNOSIS — R293 Abnormal posture: Secondary | ICD-10-CM

## 2015-07-04 DIAGNOSIS — R209 Unspecified disturbances of skin sensation: Secondary | ICD-10-CM
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
## 2015-07-11 ENCOUNTER — Ambulatory Visit: Payer: 59 | Attending: Orthopaedic Surgery | Admitting: Physical Therapy

## 2015-07-11 DIAGNOSIS — M542 Cervicalgia: Secondary | ICD-10-CM | POA: Insufficient documentation

## 2015-07-11 DIAGNOSIS — R29898 Other symptoms and signs involving the musculoskeletal system: Secondary | ICD-10-CM | POA: Insufficient documentation

## 2015-07-11 DIAGNOSIS — R208 Other disturbances of skin sensation: Secondary | ICD-10-CM | POA: Diagnosis present

## 2015-07-11 DIAGNOSIS — M25511 Pain in right shoulder: Secondary | ICD-10-CM | POA: Insufficient documentation

## 2015-07-11 DIAGNOSIS — R293 Abnormal posture: Secondary | ICD-10-CM | POA: Insufficient documentation

## 2015-07-11 DIAGNOSIS — R209 Unspecified disturbances of skin sensation: Secondary | ICD-10-CM
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
## 2015-07-12 ENCOUNTER — Ambulatory Visit (INDEPENDENT_AMBULATORY_CARE_PROVIDER_SITE_OTHER): Payer: 59 | Admitting: Internal Medicine

## 2015-07-12 ENCOUNTER — Other Ambulatory Visit (INDEPENDENT_AMBULATORY_CARE_PROVIDER_SITE_OTHER): Payer: 59

## 2015-07-12 ENCOUNTER — Encounter: Payer: Self-pay | Admitting: Internal Medicine

## 2015-07-12 VITALS — BP 128/82 | HR 97 | Temp 98.3°F | Ht 64.0 in | Wt 355.0 lb

## 2015-07-12 DIAGNOSIS — R109 Unspecified abdominal pain: Secondary | ICD-10-CM

## 2015-07-12 DIAGNOSIS — I10 Essential (primary) hypertension: Secondary | ICD-10-CM | POA: Diagnosis not present

## 2015-07-12 DIAGNOSIS — Z0000 Encounter for general adult medical examination without abnormal findings: Secondary | ICD-10-CM

## 2015-07-12 DIAGNOSIS — G4710 Hypersomnia, unspecified: Secondary | ICD-10-CM | POA: Diagnosis not present

## 2015-07-12 DIAGNOSIS — R400 Somnolence: Secondary | ICD-10-CM | POA: Insufficient documentation

## 2015-07-12 LAB — URINALYSIS, ROUTINE W REFLEX MICROSCOPIC
BILIRUBIN URINE: NEGATIVE
HGB URINE DIPSTICK: NEGATIVE
KETONES UR: NEGATIVE
Leukocytes, UA: NEGATIVE
Nitrite: NEGATIVE
Specific Gravity, Urine: 1.025 (ref 1.000–1.030)
TOTAL PROTEIN, URINE-UPE24: NEGATIVE
URINE GLUCOSE: NEGATIVE
UROBILINOGEN UA: 0.2 (ref 0.0–1.0)
pH: 6 (ref 5.0–8.0)

## 2015-07-12 LAB — CBC WITH DIFFERENTIAL/PLATELET
BASOS ABS: 0 10*3/uL (ref 0.0–0.1)
BASOS PCT: 0.3 % (ref 0.0–3.0)
Eosinophils Absolute: 0.1 10*3/uL (ref 0.0–0.7)
Eosinophils Relative: 0.9 % (ref 0.0–5.0)
HEMATOCRIT: 38.6 % (ref 36.0–46.0)
Hemoglobin: 12.6 g/dL (ref 12.0–15.0)
LYMPHS ABS: 2.9 10*3/uL (ref 0.7–4.0)
LYMPHS PCT: 28.6 % (ref 12.0–46.0)
MCHC: 32.6 g/dL (ref 30.0–36.0)
MCV: 85.5 fl (ref 78.0–100.0)
MONOS PCT: 7.3 % (ref 3.0–12.0)
Monocytes Absolute: 0.7 10*3/uL (ref 0.1–1.0)
NEUTROS ABS: 6.3 10*3/uL (ref 1.4–7.7)
NEUTROS PCT: 62.9 % (ref 43.0–77.0)
PLATELETS: 298 10*3/uL (ref 150.0–400.0)
RBC: 4.51 Mil/uL (ref 3.87–5.11)
RDW: 17 % — AB (ref 11.5–15.5)
WBC: 10 10*3/uL (ref 4.0–10.5)

## 2015-07-12 LAB — HEPATIC FUNCTION PANEL
ALK PHOS: 86 U/L (ref 39–117)
ALT: 15 U/L (ref 0–35)
AST: 12 U/L (ref 0–37)
Albumin: 3.5 g/dL (ref 3.5–5.2)
BILIRUBIN DIRECT: 0 mg/dL (ref 0.0–0.3)
BILIRUBIN TOTAL: 0.3 mg/dL (ref 0.2–1.2)
TOTAL PROTEIN: 8 g/dL (ref 6.0–8.3)

## 2015-07-12 LAB — LIPID PANEL
CHOL/HDL RATIO: 3
Cholesterol: 184 mg/dL (ref 0–200)
HDL: 67.2 mg/dL (ref >39.00)
LDL Cholesterol: 97 mg/dL (ref 0–99)
NONHDL: 116.41
Triglycerides: 98 mg/dL (ref 0.0–149.0)
VLDL: 19.6 mg/dL (ref 0.0–40.0)

## 2015-07-12 LAB — TSH: TSH: 2.02 u[IU]/mL (ref 0.35–4.50)

## 2015-07-12 LAB — BASIC METABOLIC PANEL
BUN: 14 mg/dL (ref 6–23)
CALCIUM: 9.1 mg/dL (ref 8.4–10.5)
CHLORIDE: 101 meq/L (ref 96–112)
CO2: 31 meq/L (ref 19–32)
Creatinine, Ser: 0.94 mg/dL (ref 0.40–1.20)
GFR: 83.4 mL/min (ref >60.00)
Glucose, Bld: 88 mg/dL (ref 70–99)
Potassium: 3.7 mEq/L (ref 3.5–5.1)
SODIUM: 138 meq/L (ref 135–145)

## 2015-07-12 LAB — LIPASE: Lipase: 6 U/L — ABNORMAL LOW (ref 11.0–59.0)

## 2015-07-12 LAB — H. PYLORI ANTIBODY, IGG: H PYLORI IGG: NEGATIVE

## 2015-07-12 NOTE — Assessment & Plan Note (Signed)
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
## 2015-07-12 NOTE — Patient Instructions (Signed)
Please continue all other medications as before, and refills have been done if requested.  Please have the pharmacy call with any other refills you may need.  Please continue your efforts at being more active, low cholesterol diet, and weight control.  You are otherwise up to date with prevention measures today.  Please keep your appointments with your specialists as you may have planned  You will be contacted regarding the referral for: pulmonary, and GYN  Please go to the LAB in the Basement (turn left off the elevator) for the tests to be done today  You will be contacted by phone if any changes need to be made immediately.  Otherwise, you will receive a letter about your results with an explanation, but please check with MyChart first.  Please remember to sign up for MyChart if you have not done so, as this will be important to you in the future with finding out test results, communicating by private email, and scheduling acute appointments online when needed.  Please return in 1 year for your yearly visit, or sooner if needed, with Lab testing done 3-5 days before

## 2015-07-12 NOTE — Assessment & Plan Note (Signed)
Vague symptoms, exam benign, for lipase, h pylori, labs as ordered,  to f/u any worsening symptoms or concerns

## 2015-07-12 NOTE — Assessment & Plan Note (Signed)
stable overall by history and exam, recent data reviewed with pt, and pt to continue medical treatment as before,  to f/u any worsening symptoms or concerns BP Readings from Last 3 Encounters:  07/12/15 128/82  02/28/15 97/54  02/20/15 132/88

## 2015-07-12 NOTE — Progress Notes (Signed)
Pre visit review using our clinic review tool, if applicable. No additional management support is needed unless otherwise documented below in the visit note. 

## 2015-07-12 NOTE — Assessment & Plan Note (Signed)
?   [facility] - for pulm referral for evaluation

## 2015-07-16 ENCOUNTER — Ambulatory Visit: Payer: 59 | Admitting: Physical Therapy

## 2015-07-16 DIAGNOSIS — R293 Abnormal posture: Secondary | ICD-10-CM

## 2015-07-16 DIAGNOSIS — R29898 Other symptoms and signs involving the musculoskeletal system: Secondary | ICD-10-CM | POA: Diagnosis not present
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
## 2015-07-18 ENCOUNTER — Ambulatory Visit: Payer: 59 | Admitting: Physical Therapy

## 2015-07-18 DIAGNOSIS — M25511 Pain in right shoulder: Secondary | ICD-10-CM

## 2015-07-18 DIAGNOSIS — R293 Abnormal posture: Secondary | ICD-10-CM

## 2015-07-18 DIAGNOSIS — R29898 Other symptoms and signs involving the musculoskeletal system: Secondary | ICD-10-CM
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
## 2015-07-23 ENCOUNTER — Ambulatory Visit: Payer: 59 | Admitting: Physical Therapy

## 2015-07-23 DIAGNOSIS — R29898 Other symptoms and signs involving the musculoskeletal system: Secondary | ICD-10-CM | POA: Diagnosis not present

## 2015-07-23 DIAGNOSIS — R293 Abnormal posture: Secondary | ICD-10-CM

## 2015-07-23 DIAGNOSIS — M25511 Pain in right shoulder: Secondary | ICD-10-CM
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
## 2015-07-25 ENCOUNTER — Ambulatory Visit: Payer: 59 | Admitting: Physical Therapy

## 2015-07-30 ENCOUNTER — Ambulatory Visit: Payer: 59 | Admitting: Physical Therapy

## 2015-07-30 DIAGNOSIS — R29898 Other symptoms and signs involving the musculoskeletal system: Secondary | ICD-10-CM | POA: Diagnosis not present

## 2015-07-30 DIAGNOSIS — M25511 Pain in right shoulder: Secondary | ICD-10-CM

## 2015-07-30 DIAGNOSIS — R293 Abnormal posture: Secondary | ICD-10-CM

## 2015-07-30 DIAGNOSIS — R209 Unspecified disturbances of skin sensation: Secondary | ICD-10-CM
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
## 2015-08-01 ENCOUNTER — Ambulatory Visit: Payer: 59 | Admitting: Physical Therapy

## 2015-08-01 DIAGNOSIS — M25511 Pain in right shoulder: Secondary | ICD-10-CM

## 2015-08-01 DIAGNOSIS — R293 Abnormal posture: Secondary | ICD-10-CM

## 2015-08-01 DIAGNOSIS — R209 Unspecified disturbances of skin sensation: Secondary | ICD-10-CM

## 2015-08-01 DIAGNOSIS — R29898 Other symptoms and signs involving the musculoskeletal system: Secondary | ICD-10-CM | POA: Diagnosis not present

## 2015-08-01 DIAGNOSIS — M542 Cervicalgia: Secondary | ICD-10-CM

## 2015-08-01 NOTE — Patient Instructions (Signed)
Adjusting strap on bag for improved body mechanics to offset weight in the front of the body, suggested backpack.
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
## 2015-08-06 ENCOUNTER — Ambulatory Visit: Payer: 59 | Attending: Orthopaedic Surgery | Admitting: Physical Therapy

## 2015-08-06 DIAGNOSIS — R208 Other disturbances of skin sensation: Secondary | ICD-10-CM | POA: Insufficient documentation

## 2015-08-06 DIAGNOSIS — R209 Unspecified disturbances of skin sensation: Secondary | ICD-10-CM

## 2015-08-06 DIAGNOSIS — R293 Abnormal posture: Secondary | ICD-10-CM | POA: Insufficient documentation

## 2015-08-06 DIAGNOSIS — R29898 Other symptoms and signs involving the musculoskeletal system: Secondary | ICD-10-CM | POA: Diagnosis present

## 2015-08-06 DIAGNOSIS — M25511 Pain in right shoulder: Secondary | ICD-10-CM | POA: Insufficient documentation

## 2015-08-06 DIAGNOSIS — M542 Cervicalgia: Secondary | ICD-10-CM | POA: Insufficient documentation
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
## 2015-08-08 ENCOUNTER — Ambulatory Visit: Payer: 59 | Admitting: Physical Therapy

## 2015-08-08 DIAGNOSIS — R29898 Other symptoms and signs involving the musculoskeletal system: Secondary | ICD-10-CM

## 2015-08-08 DIAGNOSIS — R209 Unspecified disturbances of skin sensation: Secondary | ICD-10-CM

## 2015-08-08 DIAGNOSIS — M25511 Pain in right shoulder: Secondary | ICD-10-CM

## 2015-08-08 DIAGNOSIS — M542 Cervicalgia: Secondary | ICD-10-CM

## 2015-08-08 DIAGNOSIS — R293 Abnormal posture: Secondary | ICD-10-CM
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
## 2015-09-17 ENCOUNTER — Institutional Professional Consult (permissible substitution): Payer: Self-pay | Admitting: Pulmonary Disease

## 2015-09-19 ENCOUNTER — Other Ambulatory Visit: Payer: Self-pay | Admitting: Internal Medicine

## 2015-09-25 ENCOUNTER — Ambulatory Visit: Payer: Self-pay | Admitting: Internal Medicine

## 2015-09-25 DIAGNOSIS — Z0289 Encounter for other administrative examinations: Secondary | ICD-10-CM

## 2015-10-25 ENCOUNTER — Telehealth: Payer: Self-pay | Admitting: Internal Medicine

## 2015-10-25 MED ORDER — ERGOCALCIFEROL 1.25 MG (50000 UT) PO CAPS: 50000.0000 [IU] | ORAL_CAPSULE | ORAL | Status: DC

## 2015-10-25 NOTE — Telephone Encounter (Signed)
Is requesting refill on Vit D to [facility].

## 2015-11-14 MED FILL — VIT D2 1.25 MG (50,000 UNIT: 1.25 MG | 84 days supply | Qty: 12 | Fill #0

## 2015-11-19 DIAGNOSIS — M25511 Pain in right shoulder: Secondary | ICD-10-CM | POA: Diagnosis not present

## 2015-11-21 MED FILL — LIDOCAINE 5% PATCH: 5 | 30 days supply | Qty: 30 | Fill #0

## 2015-12-22 ENCOUNTER — Encounter (HOSPITAL_COMMUNITY): Payer: Self-pay | Admitting: Emergency Medicine

## 2015-12-22 ENCOUNTER — Emergency Department (INDEPENDENT_AMBULATORY_CARE_PROVIDER_SITE_OTHER)
Admission: EM | Admit: 2015-12-22 | Discharge: 2015-12-22 | Disposition: A | Discharge status: Home / Self Care | Payer: 59 | Source: Home / Self Care | Attending: Emergency Medicine | Admitting: Emergency Medicine

## 2015-12-22 DIAGNOSIS — G44209 Tension-type headache, unspecified, not intractable: Secondary | ICD-10-CM

## 2015-12-22 DIAGNOSIS — M7702 Medial epicondylitis, left elbow: Secondary | ICD-10-CM | POA: Diagnosis not present

## 2015-12-22 DIAGNOSIS — M7712 Lateral epicondylitis, left elbow: Secondary | ICD-10-CM | POA: Diagnosis not present

## 2015-12-22 DIAGNOSIS — I158 Other secondary hypertension: Secondary | ICD-10-CM

## 2015-12-22 DIAGNOSIS — M659 Synovitis and tenosynovitis, unspecified: Secondary | ICD-10-CM

## 2015-12-22 DIAGNOSIS — IMO0002 Reserved for concepts with insufficient information to code with codable children: Secondary | ICD-10-CM

## 2015-12-22 NOTE — Discharge Instructions (Signed)
Hypertension Taking her medicines as directed. Call your doctor for an appointment on Monday for possible readjustment if needed for better control. Hypertension, commonly called high blood pressure, is when the force of blood pumping through your arteries is too strong. Your arteries are the blood vessels that carry blood from your heart throughout your body. A blood pressure reading consists of a higher number over a lower number, such as 110/72. The higher number (systolic) is the pressure inside your arteries when your heart pumps. The lower number (diastolic) is the pressure inside your arteries when your heart relaxes. Ideally you want your blood pressure below 120/80. Hypertension forces your heart to work harder to pump blood. Your arteries may become narrow or stiff. Having untreated or uncontrolled hypertension can cause heart attack, stroke, kidney disease, and other problems. RISK FACTORS Some risk factors for high blood pressure are controllable. Others are not.  Risk factors you cannot control include:   Race. You may be at higher risk if you are African American.  Age. Risk increases with age.  Gender. Men are at higher risk than women before age 73 years. After age 43, women are at higher risk than men. Risk factors you can control include:  Not getting enough exercise or physical activity.  Being overweight.  Getting too much fat, sugar, calories, or salt in your diet.  Drinking too much alcohol. SIGNS AND SYMPTOMS Hypertension does not usually cause signs or symptoms. Extremely high blood pressure (hypertensive crisis) may cause headache, anxiety, shortness of breath, and nosebleed. DIAGNOSIS To check if you have hypertension, your health care provider will measure your blood pressure while you are seated, with your arm held at the level of your heart. It should be measured at least twice using the same arm. Certain conditions can cause a difference in blood pressure  between your right and left arms. A blood pressure reading that is higher than normal on one occasion does not mean that you need treatment. If it is not clear whether you have high blood pressure, you may be asked to return on a different day to have your blood pressure checked again. Or, you may be asked to monitor your blood pressure at home for 1 or more weeks. TREATMENT Treating high blood pressure includes making lifestyle changes and possibly taking medicine. Living a healthy lifestyle can help lower high blood pressure. You may need to change some of your habits. Lifestyle changes may include:  Following the DASH diet. This diet is high in fruits, vegetables, and whole grains. It is low in salt, red meat, and added sugars.  Keep your sodium intake below 2,300 mg per day.  Getting at least 30-45 minutes of aerobic exercise at least 4 times per week.  Losing weight if necessary.  Not smoking.  Limiting alcoholic beverages.  Learning ways to reduce stress. Your health care provider may prescribe medicine if lifestyle changes are not enough to get your blood pressure under control, and if one of the following is true:  You are 61-20 years of age and your systolic blood pressure is above 140.  You are 25 years of age or older, and your systolic blood pressure is above 150.  Your diastolic blood pressure is above 90.  You have diabetes, and your systolic blood pressure is over 140 or your diastolic blood pressure is over 90.  You have kidney disease and your blood pressure is above 140/90.  You have heart disease and your blood pressure is above  140/90. Your personal target blood pressure may vary depending on your medical conditions, your age, and other factors. HOME CARE INSTRUCTIONS  Have your blood pressure rechecked as directed by your health care provider.   Take medicines only as directed by your health care provider. Follow the directions carefully. Blood pressure  medicines must be taken as prescribed. The medicine does not work as well when you skip doses. Skipping doses also puts you at risk for problems.  Do not smoke.   Monitor your blood pressure at home as directed by your health care provider. SEEK MEDICAL CARE IF:   You think you are having a reaction to medicines taken.  You have recurrent headaches or feel dizzy.  You have swelling in your ankles.  You have trouble with your vision. SEEK IMMEDIATE MEDICAL CARE IF:  You develop a severe headache or confusion.  You have unusual weakness, numbness, or feel faint.  You have severe chest or abdominal pain.  You vomit repeatedly.  You have trouble breathing. MAKE SURE YOU:   Understand these instructions.  Will watch your condition.  Will get help right away if you are not doing well or get worse.   This information is not intended to replace advice given to you by your health care provider. Make sure you discuss any questions you have with your health care provider.   Document Released: 10/20/2005 Document Revised: 03/06/2015 Document Reviewed: 08/12/2013 Elsevier Interactive Patient Education 2016 [employer]  Medial Epicondylitis With Rehab Medial epicondylitis involves inflammation and pain around the inner (medial) portion of the elbow. This pain is caused by inflammation of the tendons in the forearm that flex (bring down) the wrist. Medial epicondylitis is also called golfer's elbow, because it is common among golfers. However, it may occur in any individual who flexes the wrist regularly. If medial epicondylitis is left untreated, it may become a chronic problem. SYMPTOMS   Pain, tenderness, or inflammation over the inner (medial) side of the elbow.  Pain or weakness with gripping activities.  Pain that increases with wrist twisting motions (using a screwdriver, playing golf, bowling). CAUSES  Medial epicondylitis is caused by inflammation of the tendons that  flex the wrist. Causes of injury may include:  Chronic, repetitive stress and strain to the tendons that run from the wrist and forearm to the elbow.  Sudden strain on the forearm, including wrist snap when serving balls with racquet sports, or throwing a baseball. RISK INCREASES WITH:  Sports or occupations that require repetitive and/or strenuous forearm and wrist movements (pitching a baseball, golfing, carpentry).  Poor wrist and forearm strength and flexibility.  Failure to warm up properly before activity.  Resuming activity before healing, rehabilitation, and conditioning are complete. PREVENTION   Warm up and stretch properly before activity.  Maintain physical fitness:  Strength, flexibility, and endurance.  Cardiovascular fitness.  Wear and use properly fitted equipment.  Learn and use proper technique and have a coach correct improper technique.  Wear a tennis elbow (counterforce) brace. PROGNOSIS  The course of this condition depends on the degree of the injury. If treated properly, acute cases (symptoms lasting less than 4 weeks) are often resolved in 2 to 6 weeks. Chronic (longer lasting cases) often resolve in 3 to 6 months, but may require physical therapy. RELATED COMPLICATIONS   Frequently recurring symptoms, resulting in a chronic problem. Properly treating the problem the first time decreases frequency of recurrence.  Chronic inflammation, scarring, and partial tendon tear, requiring surgery.  Delayed healing or resolution of symptoms. TREATMENT  Treatment first involves the use of ice and medicine, to reduce pain and inflammation. Strengthening and stretching exercises may reduce discomfort, if performed regularly. These exercises may be performed at home, if the condition is an acute injury. Chronic cases may require a referral to a physical therapist for evaluation and treatment. Your caregiver may advise a corticosteroid injection to help reduce  inflammation. Rarely, surgery is needed. MEDICATION  If pain medicine is needed, nonsteroidal anti-inflammatory medicines (aspirin and ibuprofen), or other minor pain relievers (acetaminophen), are often advised.  Do not take pain medicine for 7 days before surgery.  Prescription pain relievers may be given, if your caregiver thinks they are needed. Use only as directed and only as much as you need.  Corticosteroid injections may be recommended. These injections should be reserved only for the most severe cases, because they can only be given a certain number of times. HEAT AND COLD  Cold treatment (icing) should be applied for 10 to 15 minutes every 2 to 3 hours for inflammation and pain, and immediately after activity that aggravates your symptoms. Use ice packs or an ice massage.  Heat treatment may be used before performing stretching and strengthening activities prescribed by your caregiver, physical therapist, or athletic trainer. Use a heat pack or a warm water soak. SEEK MEDICAL CARE IF: Symptoms get worse or do not improve in 2 weeks, despite treatment. EXERCISES  RANGE OF MOTION (ROM) AND STRETCHING EXERCISES - Epicondylitis, Medial (Golfer's Elbow) These exercises may help you when beginning to rehabilitate your injury. Your symptoms may go away with or without further involvement from your physician, physical therapist or athletic trainer. While completing these exercises, remember:   Restoring tissue flexibility helps normal motion to return to the joints. This allows healthier, less painful movement and activity.  An effective stretch should be held for at least 30 seconds.  A stretch should never be painful. You should only feel a gentle lengthening or release in the stretched tissue. RANGE OF MOTION - Wrist Flexion, Active-Assisted  Extend your right / left elbow with your fingers pointing down.*  Gently pull the back of your hand towards you, until you feel a gentle  stretch on the top of your forearm.  Hold this position for __________ seconds. Repeat __________ times. Complete this exercise __________ times per day.  *If directed by your physician, physical therapist or athletic trainer, complete this stretch with your elbow bent, rather than extended. RANGE OF MOTION - Wrist Extension, Active-Assisted  Extend your right / left elbow and turn your palm upwards.*  Gently pull your palm and fingertips back, so your wrist extends and your fingers point more toward the ground.  You should feel a gentle stretch on the inside of your forearm.  Hold this position for __________ seconds. Repeat __________ times. Complete this exercise __________ times per day. *If directed by your physician, physical therapist or athletic trainer, complete this stretch with your elbow bent, rather than extended. STRETCH - Wrist Extension   Place your right / left fingertips on a tabletop leaving your elbow slightly bent. Your fingers should point backwards.  Gently press your fingers and palm down onto the table, by straightening your elbow. You should feel a stretch on the inside of your forearm.  Hold this position for __________ seconds. Repeat __________ times. Complete this stretch __________ times per day.  STRENGTHENING EXERCISES - Epicondylitis, Medial (Golfer's Elbow) These exercises may help you when beginning  to rehabilitate your injury. They may resolve your symptoms with or without further involvement from your physician, physical therapist or athletic trainer. While completing these exercises, remember:   Muscles can gain both the endurance and the strength needed for everyday activities through controlled exercises.  Complete these exercises as instructed by your physician, physical therapist or athletic trainer. Increase the resistance and repetitions only as guided.  You may experience muscle soreness or fatigue, but the pain or discomfort you are trying  to eliminate should never worsen during these exercises. If this pain does get worse, stop and make sure you are following the directions exactly. If the pain is still present after adjustments, discontinue the exercise until you can discuss the trouble with your caregiver. STRENGTH - Wrist Flexors  Sit with your right / left forearm palm-up, and fully supported on a table or countertop. Your elbow should be resting below the height of your shoulder. Allow your wrist to extend over the edge of the surface.  Loosely holding a __________ weight, or a piece of rubber exercise band or tubing, slowly curl your hand up toward your forearm.  Hold this position for __________ seconds. Slowly lower the wrist back to the starting position in a controlled manner. Repeat __________ times. Complete this exercise __________ times per day.  STRENGTH - Wrist Extensors  Sit with your right / left forearm palm-down and fully supported. Your elbow should be resting below the height of your shoulder. Allow your wrist to extend over the edge of the surface.  Loosely holding a __________ weight, or a piece of rubber exercise band or tubing, slowly curl your hand up toward your forearm.  Hold this position for __________ seconds. Slowly lower the wrist back to the starting position in a controlled manner. Repeat __________ times. Complete this exercise __________ times per day.  STRENGTH - Ulnar Deviators  Stand with a ____________________ weight in your right / left hand, or sit while holding a rubber exercise band or tubing, with your healthy arm supported on a table or countertop.  Move your wrist so that your pinkie travels toward your forearm and your thumb moves away from your forearm.  Hold this position for __________ seconds and then slowly lower the wrist back to the starting position. Repeat __________ times. Complete this exercise __________ times per day STRENGTH - Grip   Grasp a tennis ball, a  dense sponge, or a large, rolled sock in your hand.  Squeeze as hard as you can, without increasing any pain.  Hold this position for __________ seconds. Release your grip slowly. Repeat __________ times. Complete this exercise __________ times per day.  STRENGTH - Forearm Supinators   Sit with your right / left forearm supported on a table, keeping your elbow below shoulder height. Rest your hand over the edge, palm down.  Gently grip a hammer or a soup ladle.  Without moving your elbow, slowly turn your palm and hand upward to a "thumbs-up" position.  Hold this position for __________ seconds. Slowly return to the starting position. Repeat __________ times. Complete this exercise __________ times per day.  STRENGTH - Forearm Pronators  Sit with your right / left forearm supported on a table, keeping your elbow below shoulder height. Rest your hand over the edge, palm up.  Gently grip a hammer or a soup ladle.  Without moving your elbow, slowly turn your palm and hand upward to a "thumbs-up" position.  Hold this position for __________ seconds. Slowly return to  the starting position. Repeat __________ times. Complete this exercise __________ times per day.    This information is not intended to replace advice given to you by your health care provider. Make sure you discuss any questions you have with your health care provider.   Document Released: 10/20/2005 Document Revised: 11/10/2014 Document Reviewed: 02/01/2009 Elsevier Interactive Patient Education 2016 [employer]  Tendinitis Tendinitis is swelling and inflammation of the tendons. Tendons are band-like tissues that connect muscle to bone. Tendinitis commonly occurs in the:   Shoulders (rotator cuff).  Heels (Achilles tendon).  Elbows (triceps tendon). CAUSES Tendinitis is usually caused by overusing the tendon, muscles, and joints involved. When the tissue surrounding a tendon (synovium) becomes inflamed, it is  called tenosynovitis. Tendinitis commonly develops in people whose jobs require repetitive motions. SYMPTOMS  Pain.  Tenderness.  Mild swelling. DIAGNOSIS Tendinitis is usually diagnosed by physical exam. Your health care provider may also order X-rays or other imaging tests. TREATMENT Your health care provider may recommend certain medicines or exercises for your treatment. HOME CARE INSTRUCTIONS   Use a sling or splint for as long as directed by your health care provider until the pain decreases.  Put ice on the injured area.  Put ice in a plastic bag.  Place a towel between your skin and the bag.  Leave the ice on for 15-20 minutes, 3-4 times a day, or as directed by your health care provider.  Avoid using the limb while the tendon is painful. Perform gentle range of motion exercises only as directed by your health care provider. Stop exercises if pain or discomfort increase, unless directed otherwise by your health care provider.  Only take over-the-counter or prescription medicines for pain, discomfort, or fever as directed by your health care provider. SEEK MEDICAL CARE IF:   Your pain and swelling increase.  You develop new, unexplained symptoms, especially increased numbness in the hands. MAKE SURE YOU:   Understand these instructions.  Will watch your condition.  Will get help right away if you are not doing well or get worse.   This information is not intended to replace advice given to you by your health care provider. Make sure you discuss any questions you have with your health care provider.   Document Released: 10/17/2000 Document Revised: 11/10/2014 Document Reviewed: 01/06/2011 Elsevier Interactive Patient Education 2016 [employer]  Lateral Epicondylitis With Rehab Lateral epicondylitis involves inflammation and pain around the outer portion of the elbow. The pain is caused by inflammation of the tendons in the forearm that bring back (extend) the  wrist. Lateral epicondylitis is also called tennis elbow, because it is very common in tennis players. However, it may occur in any individual who extends the wrist repetitively. If lateral epicondylitis is left untreated, it may become a chronic problem. SYMPTOMS   Pain, tenderness, and inflammation on the outer (lateral) side of the elbow.  Pain or weakness with gripping activities.  Pain that increases with wrist-twisting motions (playing tennis, using a screwdriver, opening a door or a jar).  Pain with lifting objects, including a coffee cup. CAUSES  Lateral epicondylitis is caused by inflammation of the tendons that extend the wrist. Causes of injury may include:  Repetitive stress and strain on the muscles and tendons that extend the wrist.  Sudden change in activity level or intensity.  Incorrect grip in racquet sports.  Incorrect grip size of racquet (often too large).  Incorrect hitting position or technique (usually backhand, leading with the elbow).  Using a racket that is too heavy. RISK INCREASES WITH:  Sports or occupations that require repetitive and/or strenuous forearm and wrist movements (tennis, squash, racquetball, carpentry).  Poor wrist and forearm strength and flexibility.  Failure to warm up properly before activity.  Resuming activity before healing, rehabilitation, and conditioning are complete. PREVENTION   Warm up and stretch properly before activity.  Maintain physical fitness:  Strength, flexibility, and endurance.  Cardiovascular fitness.  Wear and use properly fitted equipment.  Learn and use proper technique and have a coach correct improper technique.  Wear a tennis elbow (counterforce) brace. PROGNOSIS  The course of this condition depends on the degree of the injury. If treated properly, acute cases (symptoms lasting less than 4 weeks) are often resolved in 2 to 6 weeks. Chronic (longer lasting cases) often resolve in 3 to 6 months  but may require physical therapy. RELATED COMPLICATIONS   Frequently recurring symptoms, resulting in a chronic problem. Properly treating the problem the first time decreases frequency of recurrence.  Chronic inflammation, scarring tendon degeneration, and partial tendon tear, requiring surgery.  Delayed healing or resolution of symptoms. TREATMENT  Treatment first involves the use of ice and medicine to reduce pain and inflammation. Strengthening and stretching exercises may help reduce discomfort if performed regularly. These exercises may be performed at home if the condition is an acute injury. Chronic cases may require a referral to a physical therapist for evaluation and treatment. Your caregiver may advise a corticosteroid injection to help reduce inflammation. Rarely, surgery is needed. MEDICATION  If pain medicine is needed, nonsteroidal anti-inflammatory medicines (aspirin and ibuprofen), or other minor pain relievers (acetaminophen), are often advised.  Do not take pain medicine for 7 days before surgery.  Prescription pain relievers may be given, if your caregiver thinks they are needed. Use only as directed and only as much as you need.  Corticosteroid injections may be recommended. These injections should be reserved only for the most severe cases, because they can only be given a certain number of times. HEAT AND COLD  Cold treatment (icing) should be applied for 10 to 15 minutes every 2 to 3 hours for inflammation and pain, and immediately after activity that aggravates your symptoms. Use ice packs or an ice massage.  Heat treatment may be used before performing stretching and strengthening activities prescribed by your caregiver, physical therapist, or athletic trainer. Use a heat pack or a warm water soak. SEEK MEDICAL CARE IF: Symptoms get worse or do not improve in 2 weeks, despite treatment. EXERCISES  RANGE OF MOTION (ROM) AND STRETCHING EXERCISES - Epicondylitis,  Lateral (Tennis Elbow) These exercises may help you when beginning to rehabilitate your injury. Your symptoms may go away with or without further involvement from your physician, physical therapist, or athletic trainer. While completing these exercises, remember:   Restoring tissue flexibility helps normal motion to return to the joints. This allows healthier, less painful movement and activity.  An effective stretch should be held for at least 30 seconds.  A stretch should never be painful. You should only feel a gentle lengthening or release in the stretched tissue. RANGE OF MOTION - Wrist Flexion, Active-Assisted  Extend your right / left elbow with your fingers pointing down.*  Gently pull the back of your hand towards you, until you feel a gentle stretch on the top of your forearm.  Hold this position for __________ seconds. Repeat __________ times. Complete this exercise __________ times per day.  *If directed by your  physician, physical therapist or athletic trainer, complete this stretch with your elbow bent, rather than extended. RANGE OF MOTION - Wrist Extension, Active-Assisted  Extend your right / left elbow and turn your palm upwards.*  Gently pull your palm and fingertips back, so your wrist extends and your fingers point more toward the ground.  You should feel a gentle stretch on the inside of your forearm.  Hold this position for __________ seconds. Repeat __________ times. Complete this exercise __________ times per day. *If directed by your physician, physical therapist or athletic trainer, complete this stretch with your elbow bent, rather than extended. STRETCH - Wrist Flexion  Place the back of your right / left hand on a tabletop, leaving your elbow slightly bent. Your fingers should point away from your body.  Gently press the back of your hand down onto the table by straightening your elbow. You should feel a stretch on the top of your forearm.  Hold this  position for __________ seconds. Repeat __________ times. Complete this stretch __________ times per day.  STRETCH - Wrist Extension   Place your right / left fingertips on a tabletop, leaving your elbow slightly bent. Your fingers should point backwards.  Gently press your fingers and palm down onto the table by straightening your elbow. You should feel a stretch on the inside of your forearm.  Hold this position for __________ seconds. Repeat __________ times. Complete this stretch __________ times per day.  STRENGTHENING EXERCISES - Epicondylitis, Lateral (Tennis Elbow) These exercises may help you when beginning to rehabilitate your injury. They may resolve your symptoms with or without further involvement from your physician, physical therapist, or athletic trainer. While completing these exercises, remember:   Muscles can gain both the endurance and the strength needed for everyday activities through controlled exercises.  Complete these exercises as instructed by your physician, physical therapist or athletic trainer. Increase the resistance and repetitions only as guided.  You may experience muscle soreness or fatigue, but the pain or discomfort you are trying to eliminate should never worsen during these exercises. If this pain does get worse, stop and make sure you are following the directions exactly. If the pain is still present after adjustments, discontinue the exercise until you can discuss the trouble with your caregiver. STRENGTH - Wrist Flexors  Sit with your right / left forearm palm-up and fully supported on a table or countertop. Your elbow should be resting below the height of your shoulder. Allow your wrist to extend over the edge of the surface.  Loosely holding a __________ weight, or a piece of rubber exercise band or tubing, slowly curl your hand up toward your forearm.  Hold this position for __________ seconds. Slowly lower the wrist back to the starting position  in a controlled manner. Repeat __________ times. Complete this exercise __________ times per day.  STRENGTH - Wrist Extensors  Sit with your right / left forearm palm-down and fully supported on a table or countertop. Your elbow should be resting below the height of your shoulder. Allow your wrist to extend over the edge of the surface.  Loosely holding a __________ weight, or a piece of rubber exercise band or tubing, slowly curl your hand up toward your forearm.  Hold this position for __________ seconds. Slowly lower the wrist back to the starting position in a controlled manner. Repeat __________ times. Complete this exercise __________ times per day.  STRENGTH - Ulnar Deviators  Stand with a ____________________ weight in your right /  left hand, or sit while holding a rubber exercise band or tubing, with your healthy arm supported on a table or countertop.  Move your wrist, so that your pinkie travels toward your forearm and your thumb moves away from your forearm.  Hold this position for __________ seconds and then slowly lower the wrist back to the starting position. Repeat __________ times. Complete this exercise __________ times per day STRENGTH - Radial Deviators  Stand with a ____________________ weight in your right / left hand, or sit while holding a rubber exercise band or tubing, with your injured arm supported on a table or countertop.  Raise your hand upward in front of you or pull up on the rubber tubing.  Hold this position for __________ seconds and then slowly lower the wrist back to the starting position. Repeat __________ times. Complete this exercise __________ times per day. STRENGTH - Forearm Supinators   Sit with your right / left forearm supported on a table, keeping your elbow below shoulder height. Rest your hand over the edge, palm down.  Gently grip a hammer or a soup ladle.  Without moving your elbow, slowly turn your palm and hand upward to a  "thumbs-up" position.  Hold this position for __________ seconds. Slowly return to the starting position. Repeat __________ times. Complete this exercise __________ times per day.  STRENGTH - Forearm Pronators   Sit with your right / left forearm supported on a table, keeping your elbow below shoulder height. Rest your hand over the edge, palm up.  Gently grip a hammer or a soup ladle.  Without moving your elbow, slowly turn your palm and hand upward to a "thumbs-up" position.  Hold this position for __________ seconds. Slowly return to the starting position. Repeat __________ times. Complete this exercise __________ times per day.  STRENGTH - Grip  Grasp a tennis ball, a dense sponge, or a large, rolled sock in your hand.  Squeeze as hard as you can, without increasing any pain.  Hold this position for __________ seconds. Release your grip slowly. Repeat __________ times. Complete this exercise __________ times per day.  STRENGTH - Elbow Extensors, Isometric  Stand or sit upright, on a firm surface. Place your right / left arm so that your palm faces your stomach, and it is at the height of your waist.  Place your opposite hand on the underside of your forearm. Gently push up as your right / left arm resists. Push as hard as you can with both arms, without causing any pain or movement at your right / left elbow. Hold this stationary position for __________ seconds. Gradually release the tension in both arms. Allow your muscles to relax completely before repeating.   This information is not intended to replace advice given to you by your health care provider. Make sure you discuss any questions you have with your health care provider.   Document Released: 10/20/2005 Document Revised: 11/10/2014 Document Reviewed: 02/01/2009 Elsevier Interactive Patient Education 2016 Else[employer] Inc[employer]  Tension Headache A tension headache is a feeling of pain, pressure, or aching that is often felt  over the front and sides of the head. The pain can be dull, or it can feel tight (constricting). Tension headaches are not normally associated with nausea or vomiting, and they do not get worse with physical activity. Tension headaches can last from 30 minutes to several days. This is the most common type of headache. CAUSES The exact cause of this condition is not known. Tension headaches often  begin after stress, anxiety, or depression. Other triggers may include:  Alcohol.  Too much caffeine, or caffeine withdrawal.  Respiratory infections, such as colds, flu, or sinus infections.  Dental problems or teeth clenching.  Fatigue.  Holding your head and neck in the same position for a long period of time, such as while using a computer.  Smoking. SYMPTOMS Symptoms of this condition include:  A feeling of pressure around the head.  Dull, aching head pain.  Pain felt over the front and sides of the head.  Tenderness in the muscles of the head, neck, and shoulders. DIAGNOSIS This condition may be diagnosed based on your symptoms and a physical exam. Tests may be done, such as a CT scan or an MRI of your head. These tests may be done if your symptoms are severe or unusual. TREATMENT This condition may be treated with lifestyle changes and medicines to help relieve symptoms. HOME CARE INSTRUCTIONS Managing Pain  Take over-the-counter and prescription medicines only as told by your health care provider.  Lie down in a dark, quiet room when you have a headache.  If directed, apply ice to the head and neck area:  Put ice in a plastic bag.  Place a towel between your skin and the bag.  Leave the ice on for 20 minutes, 2-3 times per day.  Use a heating pad or a hot shower to apply heat to the head and neck area as told by your health care provider. Eating and Drinking  Eat meals on a regular schedule.  Limit alcohol use.  Decrease your caffeine intake, or stop using  caffeine. General Instructions  Keep all follow-up visits as told by your health care provider. This is important.  Keep a headache journal to help find out what may trigger your headaches. For example, write down:  What you eat and drink.  How much sleep you get.  Any change to your diet or medicines.  Try massage or other relaxation techniques.  Limit stress.  Sit up straight, and avoid tensing your muscles.  Do not use tobacco products, including cigarettes, chewing tobacco, or e-cigarettes. If you need help quitting, ask your health care provider.  Exercise regularly as told by your health care provider.  Get 7-9 hours of sleep, or the amount recommended by your health care provider. SEEK MEDICAL CARE IF:  Your symptoms are not helped by medicine.  You have a headache that is different from what you normally experience.  You have nausea or you vomit.  You have a fever. SEEK IMMEDIATE MEDICAL CARE IF:  Your headache becomes severe.  You have repeated vomiting.  You have a stiff neck.  You have a loss of vision.  You have problems with speech.  You have pain in your eye or ear.  You have muscular weakness or loss of muscle control.  You lose your balance or you have trouble walking.  You feel faint or you pass out.  You have confusion.   This information is not intended to replace advice given to you by your health care provider. Make sure you discuss any questions you have with your health care provider.   Document Released: 10/20/2005 Document Revised: 07/11/2015 Document Reviewed: 02/12/2015 Elsevier Interactive Patient Education [employer]
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
## 2015-12-22 NOTE — ED Notes (Signed)
C/o left elbow pain onset x5 months... Reports she has hit her elbow on mult different occasions Also c/o HA.... Has not had her BP meds. A&O x4... No acute distress.

## 2015-12-28 ENCOUNTER — Encounter: Payer: Self-pay | Admitting: Internal Medicine

## 2015-12-28 ENCOUNTER — Ambulatory Visit (INDEPENDENT_AMBULATORY_CARE_PROVIDER_SITE_OTHER): Payer: 59 | Admitting: Internal Medicine

## 2015-12-28 VITALS — BP 138/86 | HR 97 | Temp 98.9°F | Ht 64.0 in | Wt 345.0 lb

## 2015-12-28 DIAGNOSIS — R062 Wheezing: Secondary | ICD-10-CM | POA: Diagnosis not present

## 2015-12-28 DIAGNOSIS — R6889 Other general symptoms and signs: Secondary | ICD-10-CM

## 2015-12-28 DIAGNOSIS — I10 Essential (primary) hypertension: Secondary | ICD-10-CM

## 2015-12-28 DIAGNOSIS — R05 Cough: Secondary | ICD-10-CM

## 2015-12-28 DIAGNOSIS — R059 Cough, unspecified: Secondary | ICD-10-CM | POA: Insufficient documentation

## 2015-12-28 LAB — POCT INFLUENZA A/B
Influenza A, POC: NEGATIVE
Influenza B, POC: NEGATIVE

## 2015-12-28 MED ORDER — HYDROCODONE-HOMATROPINE 5-1.5 MG/5ML PO SYRP: 5.0000 mL | ORAL_SOLUTION | Freq: Four times a day (QID) | ORAL | Status: DC | PRN

## 2015-12-28 MED ORDER — PREDNISONE 10 MG PO TABS: ORAL_TABLET | ORAL | Status: DC

## 2015-12-28 MED ORDER — LEVOFLOXACIN 500 MG PO TABS: 500.0000 mg | ORAL_TABLET | Freq: Every day | ORAL | Status: DC

## 2015-12-28 MED FILL — levoFLOXacin 500 MG TABS: 500 | 10 days supply | Qty: 10 | Fill #0

## 2015-12-28 NOTE — Assessment & Plan Note (Signed)
Mild to mod, c/w bronchitis vs pna, decliens cxr, for antibx course,,  to f/u any worsening symptoms or concerns

## 2015-12-28 NOTE — Assessment & Plan Note (Signed)
Improved, stable overall by history and exam, recent data reviewed with pt, and pt to continue medical treatment as before,  to f/u any worsening symptoms or concerns BP Readings from Last 3 Encounters:  12/28/15 138/86  12/22/15 244/128  07/12/15 128/82
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
## 2015-12-28 NOTE — Progress Notes (Signed)
Pre visit review using our clinic review tool, if applicable. No additional management support is needed unless otherwise documented below in the visit note. 

## 2015-12-28 NOTE — Patient Instructions (Signed)
Please take all new medication as prescribed - the antibiotic, cough medicine, and prednisone  You are given the work note today  Please continue all other medications as before, and refills have been done if requested.  Please have the pharmacy call with any other refills you may need.  Please continue your efforts at being more active, low cholesterol diet, and weight control.  Please keep your appointments with your specialists as you may have planned

## 2015-12-28 NOTE — Assessment & Plan Note (Signed)
Mild to mod, for predpaac asd,  to f/u any worsening symptoms or concerns

## 2015-12-31 MED FILL — HYDROCODONE-HOMATROPINE SYR: 5-1.5 | 9 days supply | Qty: 180 | Fill #0

## 2015-12-31 MED FILL — OXYCODONE/APAP 5-325: 5-325 | 60 days supply | Qty: 60 | Fill #0

## 2016-01-02 ENCOUNTER — Ambulatory Visit (INDEPENDENT_AMBULATORY_CARE_PROVIDER_SITE_OTHER): Payer: 59 | Admitting: Pulmonary Disease

## 2016-01-02 ENCOUNTER — Encounter: Payer: Self-pay | Admitting: Pulmonary Disease

## 2016-01-02 VITALS — BP 108/82 | HR 74 | Temp 98.1°F | Ht 65.0 in | Wt 343.4 lb

## 2016-01-02 DIAGNOSIS — Z6841 Body Mass Index (BMI) 40.0 and over, adult: Secondary | ICD-10-CM | POA: Diagnosis not present

## 2016-01-02 DIAGNOSIS — G4733 Obstructive sleep apnea (adult) (pediatric): Secondary | ICD-10-CM | POA: Diagnosis not present

## 2016-01-02 NOTE — Progress Notes (Signed)
Past Medical History She  has a past medical history of Seasonal allergies; Asthma; Hypertension; Thyroid disease; Lumbar disc disease (05/10/2013); Sciatica; Complication of anesthesia; Hyperthyroidism; Unspecified hypothyroidism (07/29/2013); Anemia; Headache; Headache; Arthritis; Sciatica; and Chronic lower back pain.  Past Surgical History She  has past surgical history that includes Total thyroidectomy (2012); Shoulder open rotator cuff repair (Right, 2003); Carpal tunnel release (Right, 2005); Shoulder arthroscopy w/ rotator cuff repair (Right, 02/27/2015); Carpal tunnel release (Right, 2008); Cesarean section (1994); Dilation and curettage of uterus (1998); and Shoulder arthroscopy with rotator cuff repair and subacromial decompression (Right, 02/27/2015).  Current Outpatient Prescriptions on File Prior to Visit  Medication Sig  . albuterol (PROVENTIL HFA;VENTOLIN HFA) 108 (90 BASE) MCG/ACT inhaler Inhale 2 puffs into the lungs every 6 (six) hours as needed.  . Azelastine-Fluticasone (DYMISTA) 137-50 MCG/ACT SUSP Place 2 sprays into both nostrils at bedtime.  . beclomethasone (QVAR) 80 MCG/ACT inhaler Inhale 1 puff into the lungs as needed.  . celecoxib (CELEBREX) 200 MG capsule Take 1 capsule (200 mg total) by mouth every 12 (twelve) hours.  . cyclobenzaprine (FLEXERIL) 10 MG tablet Take 1 tablet (10 mg total) by mouth 3 (three) times daily as needed.  . ergocalciferol (VITAMIN D2) 50000 UNITS capsule Take 1 capsule (50,000 Units total) by mouth once a week. Take on wednesdays  . fluticasone (CUTIVATE) 0.05 % cream APPLY TOPICALLY 2 (TWO) TIMES DAILY.  . hydrochlorothiazide (HYDRODIURIL) 25 MG tablet TAKE 1 TABLET BY MOUTH DAILY.  Marland Kitchen HYDROcodone-homatropine (HYCODAN) 5-1.5 MG/5ML syrup Take 5 mLs by mouth every 6 (six) hours as needed for cough.  Marland Kitchen levofloxacin (LEVAQUIN) 500 MG tablet Take 1 tablet (500 mg total) by mouth daily.  Marland Kitchen levothyroxine (SYNTHROID, LEVOTHROID) 175 MCG tablet TAKE 1  TABLET BY MOUTH ONCE DAILY  . losartan (COZAAR) 100 MG tablet TAKE 1 TABLET BY MOUTH DAILY.  Marland Kitchen oxyCODONE-acetaminophen (ROXICET) 5-325 MG per tablet Take 1-2 tablets by mouth every 4 (four) hours as needed for severe pain.  . predniSONE (DELTASONE) 10 MG tablet 3 tabs by mouth per day for 3 days,2tabs per day for 3 days,1tab per day for 3 days (Patient not taking: Reported on 01/02/2016)  . [DISCONTINUED] loratadine (CLARITIN) 10 MG tablet Take 1 tablet (10 mg total) by mouth daily. (Patient not taking: Reported on 02/20/2015)   No current facility-administered medications on file prior to visit.    Allergies  Allergen Reactions  . Influenza Vaccines Shortness Of Breath    Family History Her family history includes Asthma in her mother; Heart disease in her mother; Hypertension in her other.  Social History She  reports that she has never smoked. She has never used smokeless tobacco. She reports that she does not drink alcohol or use illicit drugs.  Review of systems Review of Systems  Constitutional: Positive for appetite change and unexpected weight change. Negative for fever.  HENT: Positive for congestion, sneezing and sore throat. Negative for dental problem, ear pain, nosebleeds, postnasal drip, rhinorrhea, sinus pressure and trouble swallowing.   Eyes: Negative for redness and itching.  Respiratory: Positive for cough and shortness of breath. Negative for chest tightness and wheezing.   Cardiovascular: Positive for palpitations. Negative for leg swelling.  Gastrointestinal: Negative for nausea and vomiting.  Genitourinary: Negative for dysuria.  Musculoskeletal: Positive for joint swelling.  Skin: Negative for rash ( itching).  Neurological: Positive for headaches.  Hematological: Does not bruise/bleed easily.  Psychiatric/Behavioral: Negative for dysphoric mood. The patient is not nervous/anxious.     Chief  Complaint  Patient presents with  . SLEEP CONSULT    Referred by  Dr Jonny Ruiz. Epworth Score: 20    Vital signs BP 108/82 mmHg  Pulse 74  Temp(Src) 98.1 F (36.7 C) (Oral)  Ht  (1.651 m)  Wt 343 lb 6.4 oz (155.765 kg)  BMI 57.14 kg/m2  SpO2 100%  LMP 08/29/2015  History of Present Illness Vanessa [name] is a 45 y.o. female for evaluation of sleep problems.  She has noticed more trouble with her sleep.  She wakes up frequently.  Her boyfriend has told her that she snores, and will stop breathing.  This happens more on her back.    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  She goes to bed at 10 am and gets out of bed at 8 pm.  She wakes up several times to use the bathroom.  She doesn't use anything to help stay awake.  She has chronic pain and uses roxicet >> this helps her sleep better.  She denies sleep walking, sleep talking, bruxism, or nightmares.  There is no history of restless legs.  She denies sleep hallucinations, sleep paralysis, or cataplexy.  The Epworth score is 20 out of 24.   Physical Exam:  General - No distress ENT - No sinus tenderness, no oral exudate, no LAN, no thyromegaly, TM clear, pupils equal/reactive, MP 2, 3+ tonsils Cardiac - s1s2 regular, no murmur, pulses symmetric Chest - No wheeze/rales/dullness, good air entry, normal respiratory excursion Back - No focal tenderness Abd - Soft, non-tender, no organomegaly, + bowel sounds Ext - No edema Neuro - Normal strength, cranial nerves intact Skin - No rashes Psych - Normal mood, and behavior  Discussion: She has snoring, sleep disruption, witnessed apnea, and hypersomnolence.  She works 3 rd shift.  She has hx of HTN and hypothyroidism.  Her BMI is > 35.  I am concerned she could have sleep apnea.  Assessment/plan:  Obstructive sleep apnea. Plan: - will arrange for home sleep study to further assess  Morbid obesity. Plan: - reviewed options to assist with weight loss  Tonsillar hypertrophy. Plan: - could consider ENT assessment if she is found to have  OSA  Acute asthmatic bronchitis. Plan: - finish Abx and prednisone from PCP   Patient Instructions  Will arrange for home sleep study Will call to arrange for follow up after sleep study reviewed      Coralyn Helling, M.D. Pager [phone]

## 2016-01-02 NOTE — Progress Notes (Signed)
   Subjective:    Patient ID: Vanessa [name], female    DOB: 1971-10-14, 45 y.o.   MRN: [id number]  HPI    Review of Systems  Constitutional: Positive for appetite change and unexpected weight change. Negative for fever.  HENT: Positive for congestion, sneezing and sore throat. Negative for dental problem, ear pain, nosebleeds, postnasal drip, rhinorrhea, sinus pressure and trouble swallowing.   Eyes: Negative for redness and itching.  Respiratory: Positive for cough and shortness of breath. Negative for chest tightness and wheezing.   Cardiovascular: Positive for palpitations. Negative for leg swelling.  Gastrointestinal: Negative for nausea and vomiting.  Genitourinary: Negative for dysuria.  Musculoskeletal: Positive for joint swelling.  Skin: Negative for rash ( itching).  Neurological: Positive for headaches.  Hematological: Does not bruise/bleed easily.  Psychiatric/Behavioral: Negative for dysphoric mood. The patient is not nervous/anxious.        Objective:   Physical Exam        Assessment & Plan:

## 2016-01-02 NOTE — Patient Instructions (Signed)
Will arrange for home sleep study Will call to arrange for follow up after sleep study reviewed  
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
## 2016-01-04 MED FILL — LEVOTHYROXINE 175 MCG TAB: 175 | 30 days supply | Qty: 30 | Fill #1

## 2016-01-30 ENCOUNTER — Telehealth: Payer: Self-pay | Admitting: Internal Medicine

## 2016-01-30 MED ORDER — HYDROCODONE-HOMATROPINE 5-1.5 MG/5ML PO SYRP: 5.0000 mL | ORAL_SOLUTION | Freq: Four times a day (QID) | ORAL | Status: DC | PRN

## 2016-01-30 NOTE — Telephone Encounter (Signed)
Pt was in last month and she still isn't any better. She has a terrible cough that keeps her up at night and her ribs hurt. Since she was already seen, are you able to send her in some more antibiotics or at least some cough medicine so she can sleep Please advsie

## 2016-01-30 NOTE — Telephone Encounter (Signed)
Medication placed up front for patient to pick up, patient aware

## 2016-01-30 NOTE — Telephone Encounter (Signed)
This is some concerning if has not gotten better.  Some people do get some better with treatment but not complete, then have another infection on top of that, which I suspect could be what she refers to.  OK for cough med, but if fever, wheezing, worsening sob or doe, she would need appt, may needs cxr or other treatment

## 2016-01-30 NOTE — Telephone Encounter (Signed)
Please advise, can we do this or does she need an office visit

## 2016-02-06 ENCOUNTER — Other Ambulatory Visit: Payer: Self-pay | Admitting: Internal Medicine

## 2016-02-06 MED FILL — VIT D2 1.25 MG (50,000 UNIT: 1.25 MG | 84 days supply | Qty: 12 | Fill #1

## 2016-02-06 MED FILL — QVAR 80 MCG ORAL INHALER: 80 | 30 days supply | Qty: 9 | Fill #0

## 2016-02-06 MED FILL — CYCLOBENZAPRINE 10 MG TAB: 10 | 15 days supply | Qty: 30 | Fill #0

## 2016-02-06 MED FILL — VENTOLIN HFA 90 MCG INHALER: 108 (90 BAS | 25 days supply | Qty: 18 | Fill #0

## 2016-02-06 MED FILL — HYDROCHLOROTHIAZIDE 25 MG T: 25 | 90 days supply | Qty: 90 | Fill #1

## 2016-02-06 MED FILL — LEVOTHYROXINE 175 MCG TAB: 175 | 90 days supply | Qty: 90 | Fill #2

## 2016-02-06 MED FILL — LOSARTAN POTASSIUM 100 MG T: 100 | 90 days supply | Qty: 90 | Fill #1

## 2016-02-08 MED FILL — HYDROCODONE-HOMATROPINE SYR: 5-1.5 | 6 days supply | Qty: 180 | Fill #0

## 2016-02-18 ENCOUNTER — Telehealth: Payer: Self-pay | Admitting: Pulmonary Disease

## 2016-02-18 NOTE — Telephone Encounter (Signed)
Noted  

## 2016-02-18 NOTE — Telephone Encounter (Signed)
I had tried several times to reach pt to schedule HST but her phone does not have VM set up.  I finally reached pt and she was scheduled to pick up HST machine from me on 01/23/16.  Patient called that day stating her mother was having surgery.  Pt stated she would call back to reschedule to pick up HST machine.  I had not heard from pt so I called her today to see if she was ready to reschedule HST.  Patient stated her mother is still in the hospital @[facility] .  Patient stated she wished to decline HST at this time because she does not know when she will be able to do it with her mother in the hospital.  I advised pt I would let Dr. Craige CottaSood know and that she could call us back to reschedule when she is ready to do the HST.  I am placing pt's HST Order in Libby's office in folder marked for patients we are unable to reach/schedule for HST.

## 2016-02-27 DIAGNOSIS — M542 Cervicalgia: Secondary | ICD-10-CM | POA: Diagnosis not present

## 2016-02-27 DIAGNOSIS — G5602 Carpal tunnel syndrome, left upper limb: Secondary | ICD-10-CM | POA: Diagnosis not present

## 2016-02-27 MED FILL — CYCLOBENZAPRINE 10 MG TAB: 10 | 15 days supply | Qty: 30 | Fill #0

## 2016-02-27 MED FILL — METHYLPREDNISOLONE 4 MG TAB: 4 | 6 days supply | Qty: 21 | Fill #0

## 2016-02-27 MED FILL — LIDOCAINE 5% PATCH: 5 | 30 days supply | Qty: 30 | Fill #0

## 2016-02-27 MED FILL — DICLOFENAC SODIUM 1% GEL: 1 | 90 days supply | Qty: 400 | Fill #0

## 2016-03-05 ENCOUNTER — Ambulatory Visit: Payer: 59 | Attending: Orthopaedic Surgery | Admitting: Physical Therapy

## 2016-03-05 DIAGNOSIS — M25511 Pain in right shoulder: Secondary | ICD-10-CM | POA: Insufficient documentation

## 2016-03-05 DIAGNOSIS — M79602 Pain in left arm: Secondary | ICD-10-CM | POA: Insufficient documentation

## 2016-03-05 DIAGNOSIS — M542 Cervicalgia: Secondary | ICD-10-CM | POA: Insufficient documentation

## 2016-03-05 DIAGNOSIS — R202 Paresthesia of skin: Secondary | ICD-10-CM | POA: Insufficient documentation

## 2016-03-05 DIAGNOSIS — R293 Abnormal posture: Secondary | ICD-10-CM | POA: Insufficient documentation

## 2016-03-06 ENCOUNTER — Ambulatory Visit: Payer: 59 | Admitting: Physical Therapy

## 2016-03-06 DIAGNOSIS — M25511 Pain in right shoulder: Secondary | ICD-10-CM | POA: Diagnosis not present

## 2016-03-06 DIAGNOSIS — M79602 Pain in left arm: Secondary | ICD-10-CM

## 2016-03-06 DIAGNOSIS — M542 Cervicalgia: Secondary | ICD-10-CM

## 2016-03-06 DIAGNOSIS — R293 Abnormal posture: Secondary | ICD-10-CM

## 2016-03-06 DIAGNOSIS — R202 Paresthesia of skin: Secondary | ICD-10-CM | POA: Diagnosis not present
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
## 2016-03-17 ENCOUNTER — Ambulatory Visit: Payer: 59

## 2016-03-17 DIAGNOSIS — M79602 Pain in left arm: Secondary | ICD-10-CM | POA: Diagnosis not present

## 2016-03-17 DIAGNOSIS — R293 Abnormal posture: Secondary | ICD-10-CM

## 2016-03-17 DIAGNOSIS — R202 Paresthesia of skin: Secondary | ICD-10-CM | POA: Diagnosis not present

## 2016-03-17 DIAGNOSIS — M542 Cervicalgia: Secondary | ICD-10-CM | POA: Diagnosis not present

## 2016-03-17 DIAGNOSIS — M25511 Pain in right shoulder: Secondary | ICD-10-CM | POA: Diagnosis not present
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
## 2016-03-19 ENCOUNTER — Ambulatory Visit: Payer: 59 | Admitting: Physical Therapy

## 2016-03-24 ENCOUNTER — Ambulatory Visit: Payer: 59 | Admitting: Physical Therapy

## 2016-03-26 ENCOUNTER — Encounter: Payer: Self-pay | Admitting: Physical Therapy

## 2016-03-27 ENCOUNTER — Ambulatory Visit: Payer: 59 | Admitting: Physical Therapy

## 2016-03-28 ENCOUNTER — Ambulatory Visit: Payer: 59 | Admitting: Physical Therapy

## 2016-03-28 DIAGNOSIS — M79602 Pain in left arm: Secondary | ICD-10-CM | POA: Diagnosis not present

## 2016-03-28 DIAGNOSIS — R202 Paresthesia of skin: Secondary | ICD-10-CM | POA: Diagnosis not present

## 2016-03-28 DIAGNOSIS — M25511 Pain in right shoulder: Secondary | ICD-10-CM | POA: Diagnosis not present

## 2016-03-28 DIAGNOSIS — R293 Abnormal posture: Secondary | ICD-10-CM | POA: Diagnosis not present

## 2016-03-28 DIAGNOSIS — M542 Cervicalgia: Secondary | ICD-10-CM | POA: Diagnosis not present
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
## 2016-04-02 ENCOUNTER — Ambulatory Visit: Payer: 59 | Admitting: Physical Therapy

## 2016-04-02 DIAGNOSIS — M79602 Pain in left arm: Secondary | ICD-10-CM

## 2016-04-02 DIAGNOSIS — R202 Paresthesia of skin: Secondary | ICD-10-CM | POA: Diagnosis not present

## 2016-04-02 DIAGNOSIS — M542 Cervicalgia: Secondary | ICD-10-CM

## 2016-04-02 DIAGNOSIS — R293 Abnormal posture: Secondary | ICD-10-CM

## 2016-04-02 DIAGNOSIS — M25511 Pain in right shoulder: Secondary | ICD-10-CM | POA: Diagnosis not present
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
## 2016-04-03 ENCOUNTER — Ambulatory Visit: Payer: 59 | Attending: Physician Assistant | Admitting: Physical Therapy

## 2016-04-03 DIAGNOSIS — M542 Cervicalgia: Secondary | ICD-10-CM | POA: Insufficient documentation

## 2016-04-03 DIAGNOSIS — M25511 Pain in right shoulder: Secondary | ICD-10-CM | POA: Diagnosis not present

## 2016-04-03 DIAGNOSIS — R293 Abnormal posture: Secondary | ICD-10-CM | POA: Diagnosis not present

## 2016-04-03 DIAGNOSIS — M79602 Pain in left arm: Secondary | ICD-10-CM | POA: Insufficient documentation

## 2016-04-03 DIAGNOSIS — R202 Paresthesia of skin: Secondary | ICD-10-CM | POA: Diagnosis not present
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
## 2016-04-04 ENCOUNTER — Ambulatory Visit: Payer: 59 | Admitting: Physical Therapy

## 2016-04-07 ENCOUNTER — Ambulatory Visit: Payer: 59 | Admitting: Physical Therapy

## 2016-04-09 ENCOUNTER — Ambulatory Visit: Payer: 59 | Admitting: Physical Therapy

## 2016-04-09 DIAGNOSIS — M79602 Pain in left arm: Secondary | ICD-10-CM

## 2016-04-09 DIAGNOSIS — R293 Abnormal posture: Secondary | ICD-10-CM | POA: Diagnosis not present

## 2016-04-09 DIAGNOSIS — M542 Cervicalgia: Secondary | ICD-10-CM | POA: Diagnosis not present

## 2016-04-09 DIAGNOSIS — M25511 Pain in right shoulder: Secondary | ICD-10-CM | POA: Diagnosis not present

## 2016-04-09 DIAGNOSIS — R202 Paresthesia of skin: Secondary | ICD-10-CM | POA: Diagnosis not present
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
## 2016-04-10 ENCOUNTER — Ambulatory Visit: Payer: 59 | Admitting: Physical Therapy

## 2016-04-10 DIAGNOSIS — R202 Paresthesia of skin: Secondary | ICD-10-CM

## 2016-04-10 DIAGNOSIS — M25511 Pain in right shoulder: Secondary | ICD-10-CM | POA: Diagnosis not present

## 2016-04-10 DIAGNOSIS — R293 Abnormal posture: Secondary | ICD-10-CM | POA: Diagnosis not present

## 2016-04-10 DIAGNOSIS — M542 Cervicalgia: Secondary | ICD-10-CM

## 2016-04-10 DIAGNOSIS — M79602 Pain in left arm: Secondary | ICD-10-CM | POA: Diagnosis not present
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
## 2016-05-14 DIAGNOSIS — M542 Cervicalgia: Secondary | ICD-10-CM | POA: Diagnosis not present

## 2016-05-14 DIAGNOSIS — G5602 Carpal tunnel syndrome, left upper limb: Secondary | ICD-10-CM | POA: Diagnosis not present

## 2016-05-14 MED FILL — LEVOTHYROXINE 175 MCG TAB: 175 | 90 days supply | Qty: 90 | Fill #3

## 2016-05-14 MED FILL — HYDROCHLOROTHIAZIDE 25 MG T: 25 | 90 days supply | Qty: 90 | Fill #2

## 2016-05-14 MED FILL — LOSARTAN POTASSIUM 100 MG T: 100 | 90 days supply | Qty: 90 | Fill #2

## 2016-05-22 ENCOUNTER — Encounter: Payer: Self-pay | Admitting: Internal Medicine

## 2016-05-22 ENCOUNTER — Ambulatory Visit (INDEPENDENT_AMBULATORY_CARE_PROVIDER_SITE_OTHER): Payer: 59 | Admitting: Internal Medicine

## 2016-05-22 VITALS — BP 134/70 | HR 87 | Temp 98.4°F | Resp 20 | Wt 350.0 lb

## 2016-05-22 DIAGNOSIS — R51 Headache: Secondary | ICD-10-CM | POA: Diagnosis not present

## 2016-05-22 DIAGNOSIS — M542 Cervicalgia: Secondary | ICD-10-CM | POA: Diagnosis not present

## 2016-05-22 DIAGNOSIS — R519 Headache, unspecified: Secondary | ICD-10-CM

## 2016-05-22 MED ORDER — SUMATRIPTAN SUCCINATE 100 MG PO TABS: 100.0000 mg | ORAL_TABLET | ORAL | Status: DC | PRN

## 2016-05-22 MED ORDER — TRAMADOL HCL 50 MG PO TABS: 50.0000 mg | ORAL_TABLET | Freq: Three times a day (TID) | ORAL | Status: DC | PRN
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
## 2016-05-22 NOTE — Progress Notes (Signed)
Pre visit review using our clinic review tool, if applicable. No additional management support is needed unless otherwise documented below in the visit note. 

## 2016-05-22 NOTE — Patient Instructions (Signed)
Please take all new medication as prescribed - the imitrex generic as needed for Headache  Please continue all other medications as before, and refills have been done if requested.  Please have the pharmacy call with any other refills you may need.  Please keep your appointments with your specialists as you may have planned  You will be contacted regarding the referral for: neurology

## 2016-05-23 MED FILL — SUMATRIPTAN SUCC 100 MG TAB: 100 | 30 days supply | Qty: 9 | Fill #0

## 2016-05-23 MED FILL — traMADol HCL 50 MG TABS: 50 | 20 days supply | Qty: 60 | Fill #0

## 2016-05-25 DIAGNOSIS — R51 Headache: Secondary | ICD-10-CM

## 2016-05-25 DIAGNOSIS — R519 Headache, unspecified: Secondary | ICD-10-CM | POA: Insufficient documentation

## 2016-05-25 NOTE — Assessment & Plan Note (Signed)
?   Migraine, for imitrex trial, and refer neurology per pt request

## 2016-05-25 NOTE — Assessment & Plan Note (Signed)
Chronic recurrent, exam benign, for pain control, consider MRI if worsening neuro status

## 2016-05-28 ENCOUNTER — Encounter: Payer: Self-pay | Admitting: Internal Medicine

## 2016-05-28 DIAGNOSIS — R202 Paresthesia of skin: Secondary | ICD-10-CM | POA: Diagnosis not present

## 2016-06-16 DIAGNOSIS — G5602 Carpal tunnel syndrome, left upper limb: Secondary | ICD-10-CM | POA: Diagnosis not present

## 2016-06-17 ENCOUNTER — Other Ambulatory Visit: Payer: Self-pay | Admitting: Physician Assistant

## 2016-06-25 ENCOUNTER — Encounter (HOSPITAL_COMMUNITY): Payer: Self-pay

## 2016-06-25 ENCOUNTER — Other Ambulatory Visit (HOSPITAL_COMMUNITY): Payer: Self-pay | Admitting: *Deleted

## 2016-06-25 NOTE — Pre-Procedure Instructions (Signed)
    Vanessa PillowStacey C [name]  06/25/2016     Your procedure is scheduled on Thursday, July 03, 2016 at 11:30 AM.   [facility] at 9:30 AM.   Call this number if you have problems the morning of surgery: [phone]   Questions prior to day of surgery, please call [phone] between 8 & 4 PM.   Remember:  Do not eat food or drink liquids after midnight Wednesday, 07/02/16.  Take these medicines the morning of surgery with A SIP OF WATER: Levothyroxine (Synthroid), Tramadol - if needed, QVAR inhaler - if needed, Ventolin inhaler - if needed (bring this inhaler with you the day of surgery)  Stop NSAIDS (Celebrex, Naprosyn, Aleve, Ibuprofen) as of today.   Do not wear jewelry, make-up or nail polish.  Do not wear lotions, powders, or perfumes, or deodorant.  Do not shave 48 hours prior to surgery.    Do not bring valuables to the hospital.  [facility] is not responsible for any belongings or valuables.  Contacts, dentures or bridgework may not be worn into surgery.  Leave your suitcase in the car.  After surgery it may be brought to your room.  For patients admitted to the hospital, discharge time will be determined by your treatment team.  Patients discharged the day of surgery will not be allowed to drive home.   Special instructions: See "Preparing for Surgery" Instruction sheet.   Please read over the following fact sheets that you were given. Pain Booklet, Coughing and Deep Breathing and Surgical Site Infection Prevention

## 2016-06-26 ENCOUNTER — Encounter (HOSPITAL_COMMUNITY)
Admission: RE | Admit: 2016-06-26 | Discharge: 2016-06-26 | Disposition: A | Discharge status: Home / Self Care | Payer: 59 | Source: Ambulatory Visit | Attending: Orthopaedic Surgery | Admitting: Orthopaedic Surgery

## 2016-06-26 ENCOUNTER — Encounter (HOSPITAL_COMMUNITY): Payer: Self-pay | Admitting: General Practice

## 2016-06-26 DIAGNOSIS — Z01812 Encounter for preprocedural laboratory examination: Secondary | ICD-10-CM | POA: Diagnosis not present

## 2016-06-26 DIAGNOSIS — Z01818 Encounter for other preprocedural examination: Secondary | ICD-10-CM | POA: Diagnosis not present

## 2016-06-26 DIAGNOSIS — G5602 Carpal tunnel syndrome, left upper limb: Secondary | ICD-10-CM | POA: Diagnosis not present

## 2016-06-26 DIAGNOSIS — I10 Essential (primary) hypertension: Secondary | ICD-10-CM | POA: Diagnosis not present

## 2016-06-26 HISTORY — DX: Anxiety disorder, unspecified: F41.9

## 2016-06-26 HISTORY — DX: Personal history of other diseases of the respiratory system: Z87.09

## 2016-06-26 LAB — BASIC METABOLIC PANEL
ANION GAP: 8 (ref 5–15)
BUN: 11 mg/dL (ref 6–20)
CHLORIDE: 107 mmol/L (ref 101–111)
CO2: 24 mmol/L (ref 22–32)
Calcium: 8.8 mg/dL — ABNORMAL LOW (ref 8.9–10.3)
Creatinine, Ser: 0.97 mg/dL (ref 0.44–1.00)
GFR calc Af Amer: >60 mL/min (ref >60)
GFR calc non Af Amer: >60 mL/min (ref >60)
GLUCOSE: 80 mg/dL (ref 65–99)
POTASSIUM: 4.1 mmol/L (ref 3.5–5.1)
Sodium: 139 mmol/L (ref 135–145)

## 2016-06-26 LAB — CBC
HEMATOCRIT: 38.4 % (ref 36.0–46.0)
HEMOGLOBIN: 12.2 g/dL (ref 12.0–15.0)
MCH: 26.8 pg (ref 26.0–34.0)
MCHC: 31.8 g/dL (ref 30.0–36.0)
MCV: 84.2 fL (ref 78.0–100.0)
Platelets: 277 10*3/uL (ref 150–400)
RBC: 4.56 MIL/uL (ref 3.87–5.11)
RDW: 18.1 % — ABNORMAL HIGH (ref 11.5–15.5)
WBC: 9.1 10*3/uL (ref 4.0–10.5)

## 2016-06-26 LAB — HCG, SERUM, QUALITATIVE: Preg, Serum: NEGATIVE

## 2016-06-26 NOTE — Progress Notes (Signed)
PCP - Dr. Oliver BarreJames John Cardiologist - denies  EKG - 06/26/16 CXR - denies  Echo/Stress test/Cardiac Cath - denies  Patient denies chest pain and shortness of breath at PAT appointment.  Patient states that she was supposed to have a sleep study completed this past spring but never has not made the time to do it.

## 2016-06-26 NOTE — Progress Notes (Signed)
   06/26/16 0920  OBSTRUCTIVE SLEEP APNEA  Have you ever been diagnosed with sleep apnea through a sleep study? No  Do you snore loudly (loud enough to be heard through closed doors)?  1  Do you often feel tired, fatigued, or sleepy during the daytime (such as falling asleep during driving or talking to someone)? 1  Has anyone observed you stop breathing during your sleep? 1  Do you have, or are you being treated for high blood pressure? 1  BMI more than 35 kg/m2? 1  Age > 50 (1-yes) 0  Neck circumference greater than:Female 16 inches or larger, Female 17inches or larger? 1  Female Gender (Yes=1) 0  Obstructive Sleep Apnea Score 6

## 2016-06-27 IMAGING — CT CT SHOULDER*R* W/CM
3 series · 9 of 33 positions shown, 10 images · non-contrast
Comparison: None.

CLINICAL DATA: Right shoulder injury.  Right shoulder pain.

EXAM:
CT ARTHROGRAPHY OF THE RIGHT SHOULDER
TECHNIQUE: Multidetector CT imaging was performed following the standard
protocol after injection of dilute contrast into the joint.

[Series 202: soft tissue · axial · 0.36mm/px · z∈[+67,+67]mm · 1 of 81 slices shown, 2 images]
[im 44/81  soft-tissue]
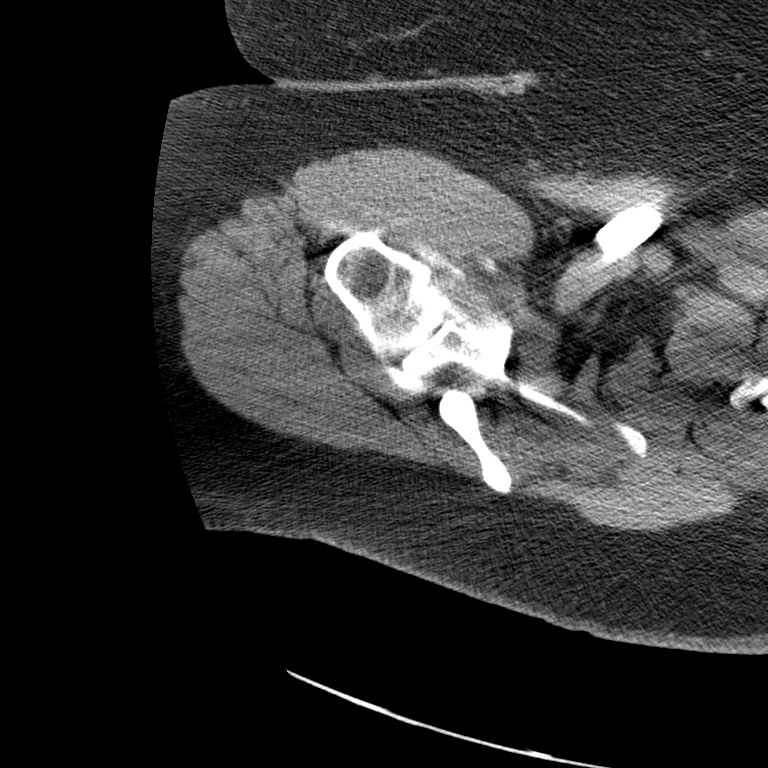
[im 44/81  bone]
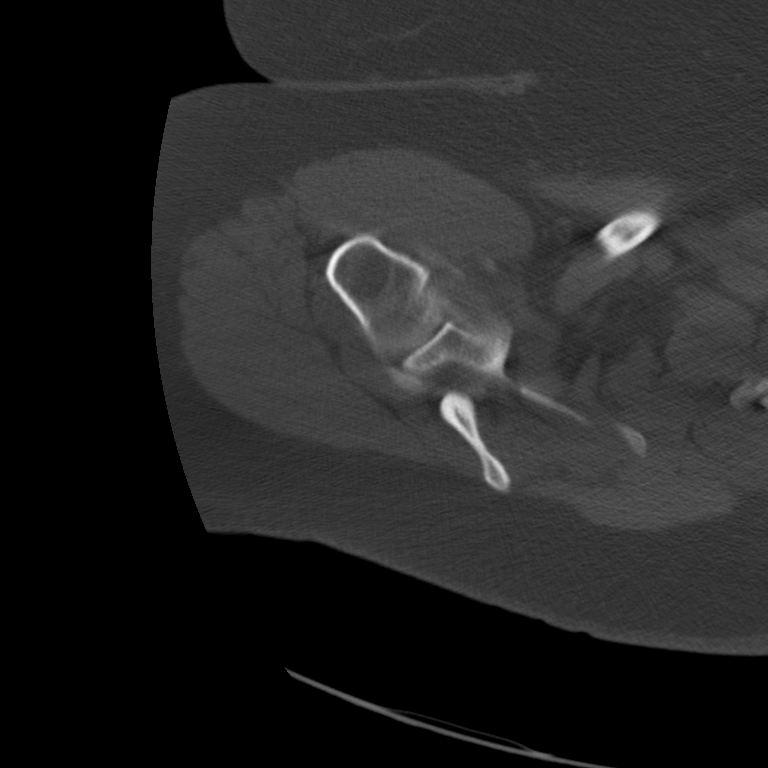

[Series 2012: coronal st · coronal · 0.42mm/px · 3 of 80 slices shown]
[im 16/80  bone]
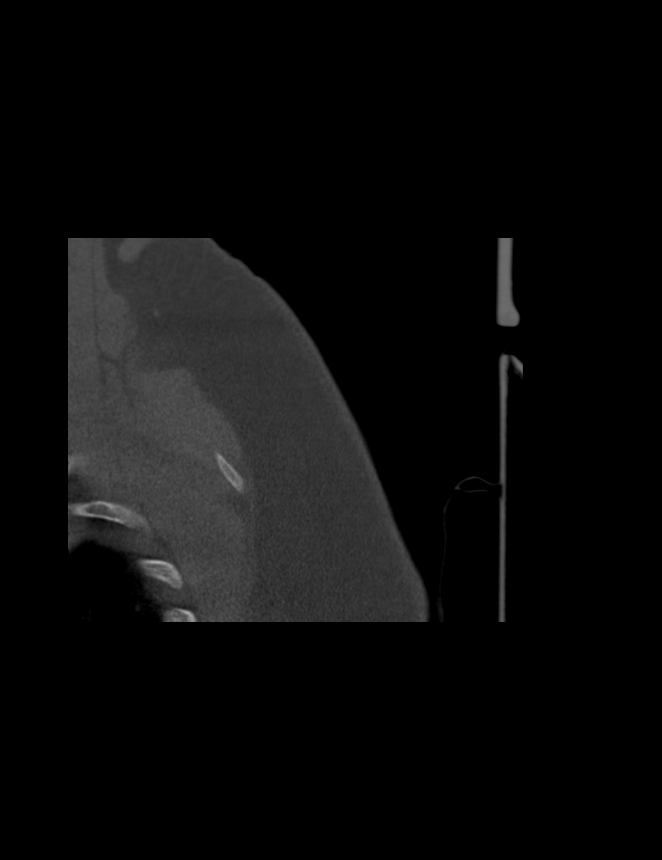
[im 32/80  bone]
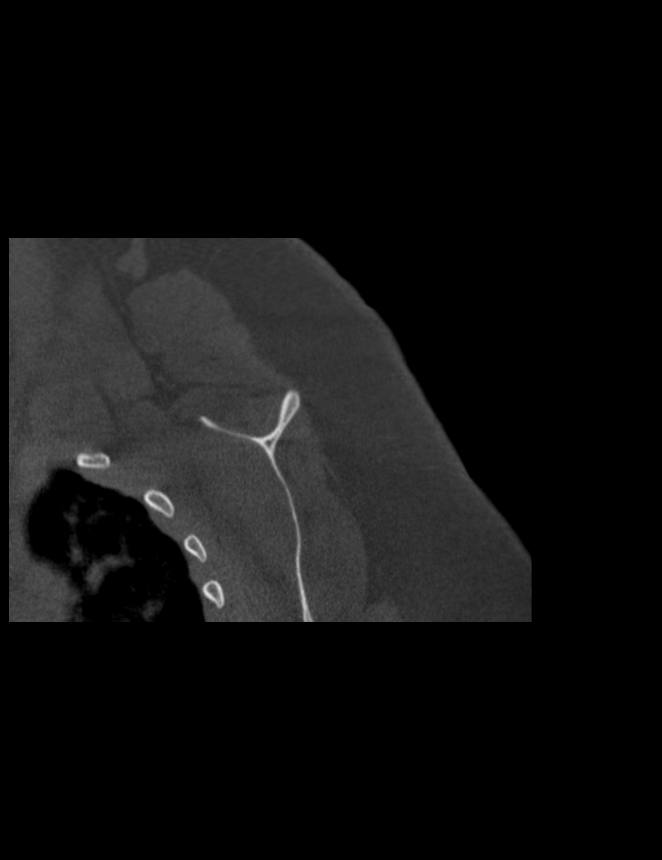
[im 48/80  bone]
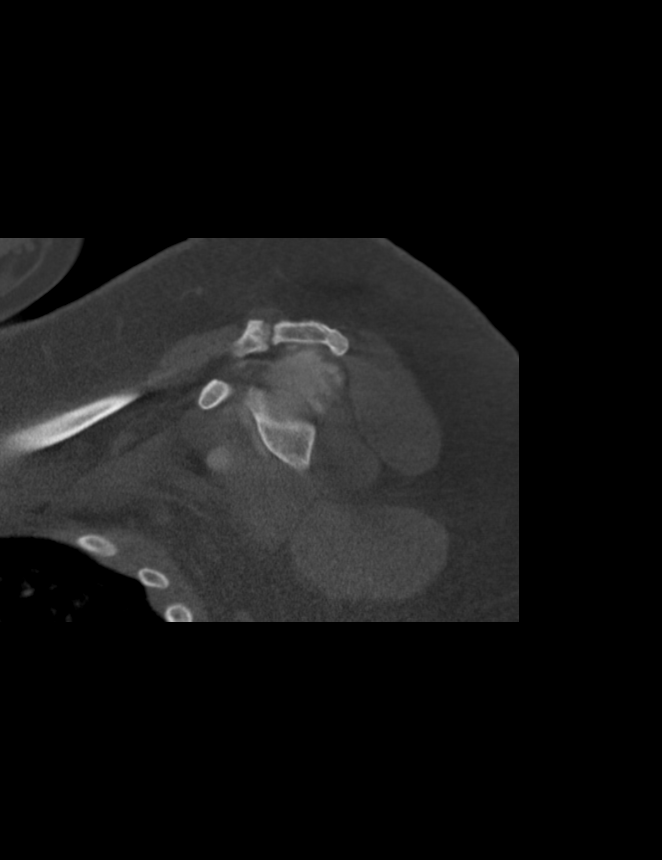

[Series 2016: bone sag · sagittal · 0.44mm/px · 5 of 50 slices shown]
[im 17/50  bone]
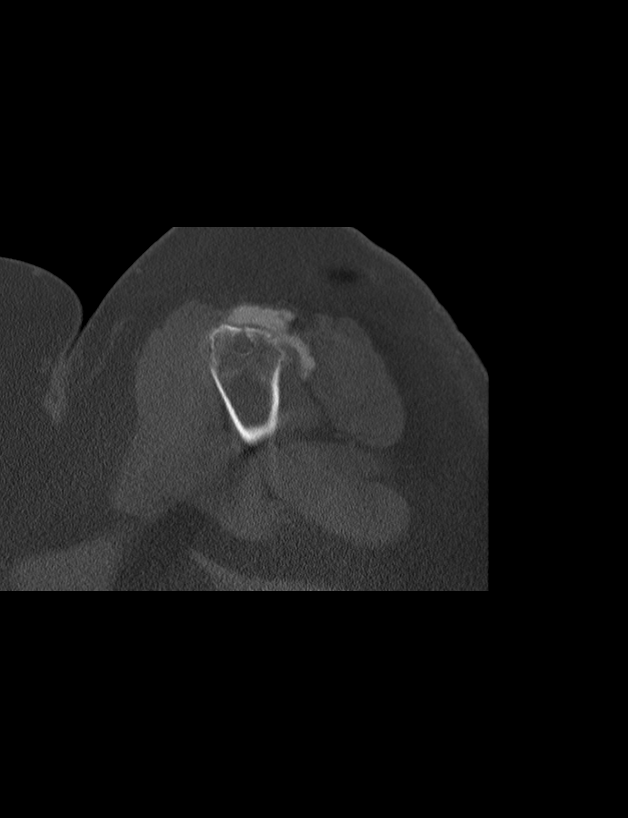
[im 21/50  bone]
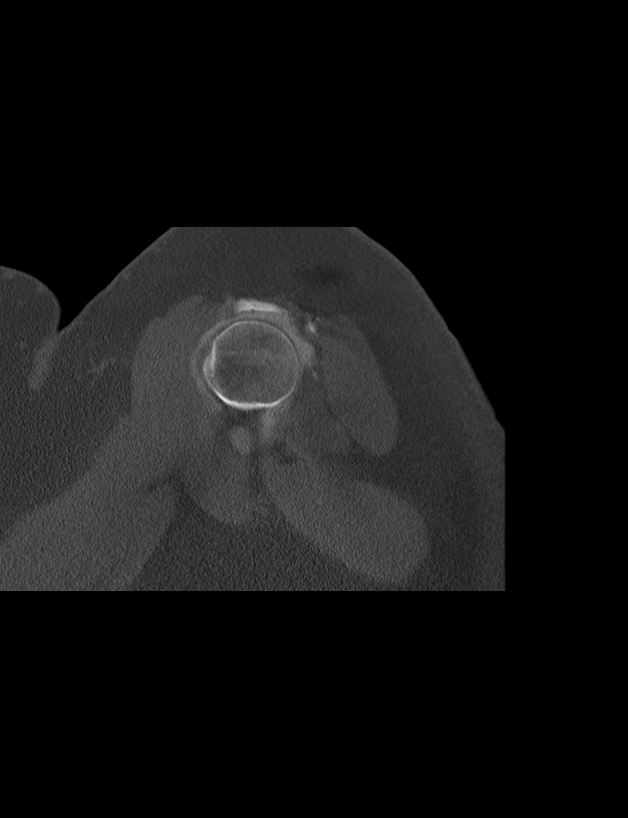
[im 25/50  bone]
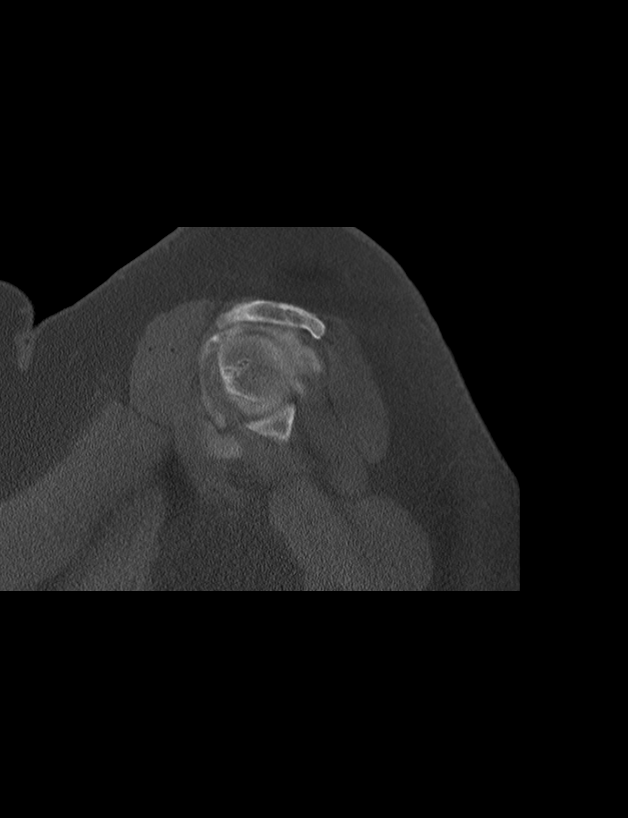
[im 29/50  bone]
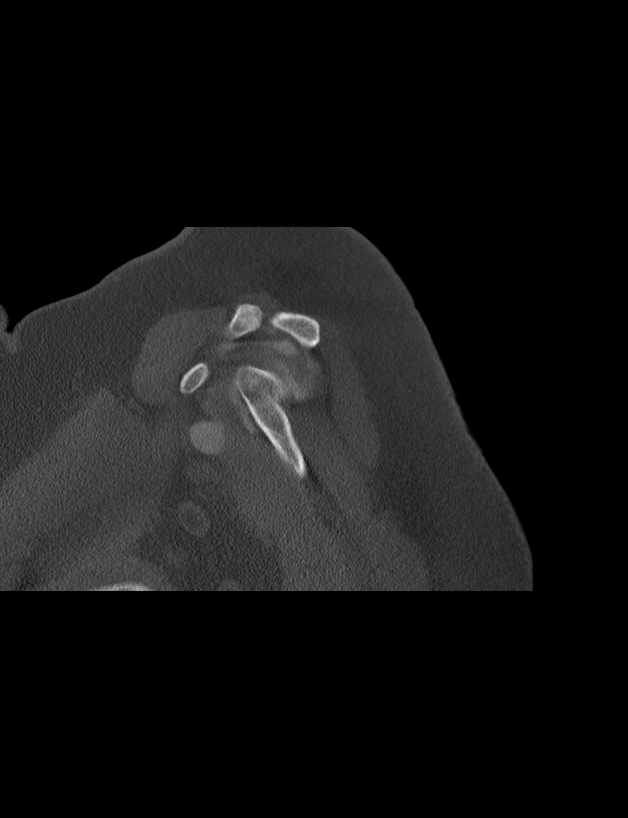
[im 33/50  bone]
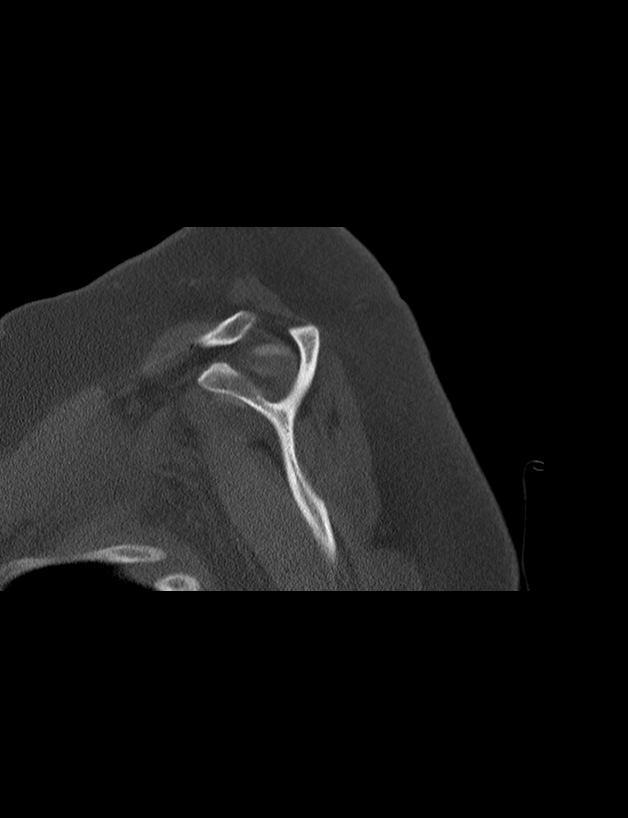

[9 of 33 positions shown; findings below may reference images not displayed]

FINDINGS: Rotator cuff: Complete tear of the supraspinatus and infraspinatus
tendons with 3.5 cm of retraction. Teres minor tendon is intact.
Subscapularis tendon is intact.

Muscles: No atrophy or fatty replacement of the muscles of the
rotator cuff.

Biceps Long Head: Grossly intact, but not well seen.

Acromioclavicular Joint: Moderate degenerative changes of the
acromioclavicular joint. Type III acromion.

Glenohumeral Joint: Intra-articular contrast. No significant
marginal osteophytosis. No focal chondral defect.

Labrum: Grossly intact.

Other: No focal fluid collection, hematoma or soft tissue mass.
IMPRESSION: 1. Complete tear of the supraspinatus and infraspinatus tendons with
3.5 cm of retraction.

## 2016-06-30 ENCOUNTER — Other Ambulatory Visit: Payer: Self-pay | Admitting: Internal Medicine

## 2016-06-30 MED FILL — CYCLOBENZAPRINE 10 MG TAB: 10 | 15 days supply | Qty: 30 | Fill #0

## 2016-06-30 MED FILL — VIT D2 1.25 MG (50,000 UNIT: 1.25 MG | 28 days supply | Qty: 4 | Fill #0

## 2016-07-02 MED ORDER — DEXTROSE 5 % IV SOLN
3.0000 g | INTRAVENOUS | Status: DC
  Filled 2016-07-02: qty 3000

## 2016-07-03 ENCOUNTER — Encounter (HOSPITAL_COMMUNITY)
Admission: RE | Disposition: A | Discharge status: Home / Self Care | Payer: Self-pay | Source: Ambulatory Visit | Attending: Orthopaedic Surgery

## 2016-07-03 ENCOUNTER — Ambulatory Visit (HOSPITAL_COMMUNITY): Payer: 59 | Admitting: Certified Registered Nurse Anesthetist

## 2016-07-03 ENCOUNTER — Ambulatory Visit (HOSPITAL_COMMUNITY)
Admission: RE | Admit: 2016-07-03 | Discharge: 2016-07-03 | Disposition: A | Discharge status: Home / Self Care | Payer: 59 | Source: Ambulatory Visit | Attending: Orthopaedic Surgery | Admitting: Orthopaedic Surgery

## 2016-07-03 DIAGNOSIS — E039 Hypothyroidism, unspecified: Secondary | ICD-10-CM | POA: Diagnosis not present

## 2016-07-03 DIAGNOSIS — I10 Essential (primary) hypertension: Secondary | ICD-10-CM | POA: Insufficient documentation

## 2016-07-03 DIAGNOSIS — M545 Low back pain: Secondary | ICD-10-CM | POA: Diagnosis not present

## 2016-07-03 DIAGNOSIS — G5602 Carpal tunnel syndrome, left upper limb: Secondary | ICD-10-CM | POA: Diagnosis not present

## 2016-07-03 DIAGNOSIS — D649 Anemia, unspecified: Secondary | ICD-10-CM | POA: Diagnosis not present

## 2016-07-03 DIAGNOSIS — G5600 Carpal tunnel syndrome, unspecified upper limb: Secondary | ICD-10-CM

## 2016-07-03 DIAGNOSIS — E0590 Thyrotoxicosis, unspecified without thyrotoxic crisis or storm: Secondary | ICD-10-CM | POA: Insufficient documentation

## 2016-07-03 HISTORY — PX: CARPAL TUNNEL RELEASE: SHX101

## 2016-07-03 SURGERY — CARPAL TUNNEL RELEASE
Anesthesia: General | Laterality: Left

## 2016-07-03 MED ORDER — FENTANYL CITRATE (PF) 100 MCG/2ML IJ SOLN
25.0000 ug | INTRAMUSCULAR | Status: DC | PRN
  Administered 2016-07-03 (×3): 50 ug via INTRAVENOUS

## 2016-07-03 MED ORDER — PROPOFOL 1000 MG/100ML IV EMUL
INTRAVENOUS | Status: AC
  Filled 2016-07-03: qty 100

## 2016-07-03 MED ORDER — LACTATED RINGERS IV SOLN
INTRAVENOUS | Status: DC
  Administered 2016-07-03 (×2): via INTRAVENOUS

## 2016-07-03 MED ORDER — LIDOCAINE 2% (20 MG/ML) 5 ML SYRINGE
INTRAMUSCULAR | Status: AC
  Filled 2016-07-03: qty 5

## 2016-07-03 MED ORDER — PROPOFOL 10 MG/ML IV BOLUS
INTRAVENOUS | Status: AC
  Filled 2016-07-03: qty 20

## 2016-07-03 MED ORDER — LIDOCAINE HCL (PF) 1 % IJ SOLN
INTRAMUSCULAR | Status: AC
  Filled 2016-07-03: qty 30

## 2016-07-03 MED ORDER — FENTANYL CITRATE (PF) 100 MCG/2ML IJ SOLN
INTRAMUSCULAR | Status: AC
  Filled 2016-07-03: qty 2

## 2016-07-03 MED ORDER — OXYCODONE HCL 5 MG/5ML PO SOLN: 5.0000 mg | Freq: Once | ORAL | Status: AC | PRN

## 2016-07-03 MED ORDER — MIDAZOLAM HCL 2 MG/2ML IJ SOLN
INTRAMUSCULAR | Status: DC | PRN
  Administered 2016-07-03: 1 mg via INTRAVENOUS

## 2016-07-03 MED ORDER — MIDAZOLAM HCL 2 MG/2ML IJ SOLN
INTRAMUSCULAR | Status: AC
  Filled 2016-07-03: qty 2

## 2016-07-03 MED ORDER — CEFAZOLIN SODIUM 1 G IJ SOLR
INTRAMUSCULAR | Status: DC | PRN
  Administered 2016-07-03: 2 g via INTRAMUSCULAR

## 2016-07-03 MED ORDER — OXYCODONE HCL 5 MG PO TABS
5.0000 mg | ORAL_TABLET | Freq: Once | ORAL | Status: AC | PRN
  Administered 2016-07-03: 5 mg via ORAL

## 2016-07-03 MED ORDER — ONDANSETRON HCL 4 MG/2ML IJ SOLN: 4.0000 mg | Freq: Once | INTRAMUSCULAR | Status: DC | PRN

## 2016-07-03 MED ORDER — FENTANYL CITRATE (PF) 100 MCG/2ML IJ SOLN
INTRAMUSCULAR | Status: DC | PRN
  Administered 2016-07-03: 50 ug via INTRAVENOUS
  Administered 2016-07-03 (×2): 25 ug via INTRAVENOUS

## 2016-07-03 MED ORDER — ONDANSETRON HCL 4 MG/2ML IJ SOLN
INTRAMUSCULAR | Status: DC | PRN
  Administered 2016-07-03: 4 mg via INTRAVENOUS

## 2016-07-03 MED ORDER — LIDOCAINE HCL (CARDIAC) 20 MG/ML IV SOLN
INTRAVENOUS | Status: DC | PRN
  Administered 2016-07-03: 100 mg via INTRATRACHEAL

## 2016-07-03 MED ORDER — BUPIVACAINE HCL (PF) 0.25 % IJ SOLN
INTRAMUSCULAR | Status: AC
  Filled 2016-07-03: qty 30

## 2016-07-03 MED ORDER — PROPOFOL 10 MG/ML IV BOLUS
INTRAVENOUS | Status: DC | PRN
  Administered 2016-07-03: 200 mg via INTRAVENOUS

## 2016-07-03 MED ORDER — EPHEDRINE SULFATE 50 MG/ML IJ SOLN
INTRAMUSCULAR | Status: DC | PRN
  Administered 2016-07-03: 5 mg via INTRAVENOUS

## 2016-07-03 MED ORDER — ALBUTEROL SULFATE HFA 108 (90 BASE) MCG/ACT IN AERS
INHALATION_SPRAY | RESPIRATORY_TRACT | Status: DC | PRN
  Administered 2016-07-03: 2 via RESPIRATORY_TRACT

## 2016-07-03 MED ORDER — EPHEDRINE 5 MG/ML INJ
INTRAVENOUS | Status: AC
  Filled 2016-07-03: qty 10

## 2016-07-03 MED ORDER — KETAMINE HCL-SODIUM CHLORIDE 100-0.9 MG/10ML-% IV SOSY
PREFILLED_SYRINGE | INTRAVENOUS | Status: AC
  Filled 2016-07-03: qty 10

## 2016-07-03 MED ORDER — HYDROCODONE-ACETAMINOPHEN 5-325 MG PO TABS: 1.0000 | ORAL_TABLET | Freq: Four times a day (QID) | ORAL | 0 refills | Status: DC | PRN

## 2016-07-03 MED ORDER — OXYCODONE HCL 5 MG PO TABS
ORAL_TABLET | ORAL | Status: AC
  Filled 2016-07-03: qty 1

## 2016-07-03 MED ORDER — CHLORHEXIDINE GLUCONATE 4 % EX LIQD: 60.0000 mL | Freq: Once | CUTANEOUS | Status: DC

## 2016-07-03 MED ORDER — ONDANSETRON HCL 4 MG/2ML IJ SOLN
INTRAMUSCULAR | Status: AC
  Filled 2016-07-03: qty 2

## 2016-07-03 MED ORDER — KETAMINE HCL 10 MG/ML IJ SOLN
INTRAMUSCULAR | Status: DC | PRN
  Administered 2016-07-03 (×2): 20 mg via INTRAVENOUS

## 2016-07-03 MED ORDER — BUPIVACAINE HCL 0.25 % IJ SOLN
INTRAMUSCULAR | Status: DC | PRN
  Administered 2016-07-03: 10 mL

## 2016-07-03 MED ORDER — FENTANYL CITRATE (PF) 100 MCG/2ML IJ SOLN
INTRAMUSCULAR | Status: AC
  Administered 2016-07-03: 50 ug via INTRAVENOUS
  Filled 2016-07-03: qty 2

## 2016-07-03 MED ORDER — GLYCOPYRROLATE 0.2 MG/ML IJ SOLN
INTRAMUSCULAR | Status: DC | PRN
  Administered 2016-07-03 (×3): 0.1 mg via INTRAVENOUS

## 2016-07-03 MED FILL — HYDROCODON-APAP 5-325: 5-325 | 8 days supply | Qty: 60 | Fill #0

## 2016-07-03 SURGICAL SUPPLY — 40 items
BANDAGE ACE 4X5 VEL STRL LF (GAUZE/BANDAGES/DRESSINGS) ×2 IMPLANT
BANDAGE ELASTIC 3 VELCRO ST LF (GAUZE/BANDAGES/DRESSINGS) ×2 IMPLANT
BNDG GAUZE ELAST 4 BULKY (GAUZE/BANDAGES/DRESSINGS) ×2 IMPLANT
CORDS BIPOLAR (ELECTRODE) ×2 IMPLANT
COVER SURGICAL LIGHT HANDLE (MISCELLANEOUS) ×2 IMPLANT
CUFF TOURNIQUET SINGLE 18IN (TOURNIQUET CUFF) ×2 IMPLANT
CUFF TOURNIQUET SINGLE 24IN (TOURNIQUET CUFF) IMPLANT
DRAPE SURG 17X23 STRL (DRAPES) IMPLANT
DRAPE U-SHAPE 47X51 STRL (DRAPES) IMPLANT
DURAPREP 26ML APPLICATOR (WOUND CARE) ×2 IMPLANT
GAUZE SPONGE 4X4 12PLY STRL (GAUZE/BANDAGES/DRESSINGS) ×2 IMPLANT
GAUZE XEROFORM 1X8 LF (GAUZE/BANDAGES/DRESSINGS) ×2 IMPLANT
GLOVE BIO SURGEON STRL SZ8 (GLOVE) ×2 IMPLANT
GLOVE BIOGEL PI IND STRL 8 (GLOVE) ×2 IMPLANT
GLOVE BIOGEL PI INDICATOR 8 (GLOVE) ×2
GLOVE ORTHO TXT STRL SZ7.5 (GLOVE) ×2 IMPLANT
GOWN STRL REUS W/ TWL LRG LVL3 (GOWN DISPOSABLE) IMPLANT
GOWN STRL REUS W/ TWL XL LVL3 (GOWN DISPOSABLE) ×2 IMPLANT
GOWN STRL REUS W/TWL LRG LVL3 (GOWN DISPOSABLE)
GOWN STRL REUS W/TWL XL LVL3 (GOWN DISPOSABLE) ×2
KIT BASIN OR (CUSTOM PROCEDURE TRAY) ×2 IMPLANT
KIT ROOM TURNOVER OR (KITS) ×2 IMPLANT
NEEDLE 22X1 1/2 (OR ONLY) (NEEDLE) IMPLANT
NS IRRIG 1000ML POUR BTL (IV SOLUTION) ×2 IMPLANT
PACK ORTHO EXTREMITY (CUSTOM PROCEDURE TRAY) ×2 IMPLANT
PAD ARMBOARD 7.5X6 YLW CONV (MISCELLANEOUS) ×4 IMPLANT
PAD CAST 4YDX4 CTTN HI CHSV (CAST SUPPLIES) ×1 IMPLANT
PADDING CAST COTTON 4X4 STRL (CAST SUPPLIES) ×1
SPONGE GAUZE 4X4 12PLY STER LF (GAUZE/BANDAGES/DRESSINGS) ×2 IMPLANT
SUCTION FRAZIER HANDLE 10FR (MISCELLANEOUS)
SUCTION TUBE FRAZIER 10FR DISP (MISCELLANEOUS) IMPLANT
SUT ETHILON 3 0 PS 1 (SUTURE) ×2 IMPLANT
SUT ETHILON 4 0 PS 2 18 (SUTURE) IMPLANT
SUT VIC AB 2-0 CT1 27 (SUTURE) ×1
SUT VIC AB 2-0 CT1 TAPERPNT 27 (SUTURE) ×1 IMPLANT
SYR CONTROL 10ML LL (SYRINGE) IMPLANT
TOWEL OR 17X26 10 PK STRL BLUE (TOWEL DISPOSABLE) ×2 IMPLANT
TUBE CONNECTING 12X1/4 (SUCTIONS) IMPLANT
UNDERPAD 30X30 (UNDERPADS AND DIAPERS) ×2 IMPLANT
WATER STERILE IRR 1000ML POUR (IV SOLUTION) ×2 IMPLANT

## 2016-07-03 NOTE — Transfer of Care (Signed)
Immediate Anesthesia Transfer of Care Note  Patient: Vanessa [name]  Procedure(s) Performed: Procedure(s): LEFT CARPAL TUNNEL RELEASE (Left)  Patient Location: PACU  Anesthesia Type:General  Level of Consciousness: awake, alert  and patient cooperative  Airway & Oxygen Therapy: Patient Spontanous Breathing and Patient connected to face mask oxygen  Post-op Assessment: Report given to RN, Post -op Vital signs reviewed and stable, Patient moving all extremities X 4 and Patient able to stick tongue midline  Post vital signs: Reviewed and stable  Last Vitals:  Vitals:   07/03/16 0910  BP: (!) 113/56  Pulse: 68  Resp: 20  Temp: 36.9 C    Last Pain:  Vitals:   07/03/16 0910  TempSrc: Oral         Complications: No apparent anesthesia complications

## 2016-07-03 NOTE — Discharge Instructions (Signed)
No heavy lifting or gripping with your left hand, but you may use your hand as comfort allows. Ice and elevation as needed for swelling. Leave your dressing on and keep it clean and dry for the next 5 days. In 5 days, you may remove your dressings and get your incision wet daily in the shower at that standpoint. New dry dressings daily or large band-aids starting in 5 days.
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
## 2016-07-03 NOTE — Brief Op Note (Signed)
07/03/2016  11:49 AM  PATIENT:  Vanessa [name]  45 y.o. female  PRE-OPERATIVE DIAGNOSIS:  severe left carpal tunnel syndrome  POST-OPERATIVE DIAGNOSIS:  severe left carpal tunnel syndrome  PROCEDURE:  Procedure(s): LEFT CARPAL TUNNEL RELEASE (Left)  SURGEON:  Surgeon(s) and Role:    * Kathryne Hitchhristopher Y Moishe Schellenberg, MD - Primary  PHYSICIAN ASSISTANT: Rexene EdisonGil Clark, PA-C  ANESTHESIA:   local and general  EBL:  Total I/O In: 1000 [I.V.:1000] Out: -   LOCAL MEDICATIONS USED:  MARCAINE      COUNTS:  YES  TOURNIQUET:  * Missing tourniquet times found for documented tourniquets in log:  [id number] *  DICTATION: .Other Dictation: Dictation Number [id number]  PLAN OF CARE: Discharge to home after PACU  PATIENT DISPOSITION:  PACU - hemodynamically stable.   Delay start of Pharmacological VTE agent (>24hrs) due to surgical blood loss or risk of bleeding: not applicable

## 2016-07-03 NOTE — Anesthesia Postprocedure Evaluation (Signed)
Anesthesia Post Note  Patient: Vanessa PillowStacey C [name]  Procedure(s) Performed: Procedure(s) (LRB): LEFT CARPAL TUNNEL RELEASE (Left)  Patient location during evaluation: PACU Anesthesia Type: General Level of consciousness: awake, awake and alert and oriented Pain management: pain level controlled Vital Signs Assessment: post-procedure vital signs reviewed and stable Respiratory status: spontaneous breathing, nonlabored ventilation and respiratory function stable Cardiovascular status: blood pressure returned to baseline    Last Vitals:  Vitals:   07/03/16 1245 07/03/16 1320  BP: (!) 99/57 112/60  Pulse: 64   Resp: 16 16  Temp:      Last Pain:  Vitals:   07/03/16 1320  TempSrc:   PainSc: 2                  Tamilyn Lupien COKER

## 2016-07-03 NOTE — Anesthesia Preprocedure Evaluation (Addendum)
Anesthesia Evaluation  Patient identified by MRN, date of birth, ID band Patient awake    Reviewed: Allergy & Precautions, NPO status , Patient's Chart, lab work & pertinent test results  Airway Mallampati: II  TM Distance: >3 FB Neck ROM: Full    Dental  (+) Teeth Intact, Dental Advisory Given   Pulmonary    breath sounds clear to auscultation       Cardiovascular hypertension,  Rhythm:Regular Rate:Normal     Neuro/Psych    GI/Hepatic   Endo/Other    Renal/GU      Musculoskeletal   Abdominal   Peds  Hematology   Anesthesia Other Findings   Reproductive/Obstetrics                            Anesthesia Physical Anesthesia Plan  ASA: II  Anesthesia Plan: General   Post-op Pain Management:    Induction: Intravenous  Airway Management Planned: LMA  Additional Equipment:   Intra-op Plan:   Post-operative Plan:   Informed Consent: I have reviewed the patients History and Physical, chart, labs and discussed the procedure including the risks, benefits and alternatives for the proposed anesthesia with the patient or authorized representative who has indicated his/her understanding and acceptance.   Dental advisory given  Plan Discussed with: CRNA and Anesthesiologist  Anesthesia Plan Comments:         Anesthesia Quick Evaluation  

## 2016-07-03 NOTE — Anesthesia Procedure Notes (Signed)
Procedure Name: LMA Insertion Date/Time: 07/03/2016 11:35 AM Performed by: Noreene LarssonJOSLIN, DAVID Pre-anesthesia Checklist: Patient identified, Emergency Drugs available, Suction available and Patient being monitored Patient Re-evaluated:Patient Re-evaluated prior to inductionOxygen Delivery Method: Circle system utilized Preoxygenation: Pre-oxygenation with 100% oxygen Intubation Type: IV induction Ventilation: Mask ventilation without difficulty LMA: LMA inserted LMA Size: 5.0 Number of attempts: 1 Placement Confirmation: ETT inserted through vocal cords under direct vision,  breath sounds checked- equal and bilateral and positive ETCO2 Tube secured with: Tape Dental Injury: Teeth and Oropharynx as per pre-operative assessment

## 2016-07-04 ENCOUNTER — Encounter (HOSPITAL_COMMUNITY): Payer: Self-pay | Admitting: Orthopaedic Surgery

## 2016-07-04 NOTE — Op Note (Signed)
[name]:  Boster, Laticha                ACCOUNT NO.:  [id number]  MEDICAL RECORD NO.:  [id number]  LOCATION:  [facility]                         FACILITY:  [facility]  PHYSICIAN:  Vanita PandaChristopher Y. Magnus IvanBlackman, M.D.DATE OF BIRTH:  1970/12/18  DATE OF PROCEDURE:  07/03/2016 DATE OF DISCHARGE:  07/03/2016                              OPERATIVE REPORT   PREOPERATIVE DIAGNOSIS:  Severe carpal tunnel syndrome, left upper extremity.  POSTOPERATIVE DIAGNOSIS:  Severe carpal tunnel syndrome, left upper extremity.  PROCEDURE:  Left open carpal tunnel release.  SURGEON:  Vanita PandaChristopher Y. Magnus IvanBlackman, M.D.  ASSISTANT:  Richardean CanalGilbert Clark, PA-C.  ANESTHESIA: 1. General. 2. Local with 0.25% plain Marcaine.  ANTIBIOTICS:  2 g of IV Ancef.  BLOOD LOSS:  Minimal.  TOURNIQUET TIME:  Less than 20 minutes.  COMPLICATIONS:  None.  INDICATIONS:  [name] is a very pleasant 45 year old female with numbness and tingling and symptoms of carpal tunnel syndrome involving left upper extremity.  This was confirmed with EMG and nerve conduction velocity studies, which showed severe carpal tunnel syndrome.  With this being such severe disease, we have recommended open carpal tunnel release.  We explained the risks and benefits of surgery to her in detail and she does wish to proceed.  PROCEDURE DESCRIPTION:  After informed consent was obtained, appropriate left hand and wrist were marked.  She was brought to the operating room, placed supine on the operating table with left arm on arm table. General anesthesia was then obtained.  A nonsterile tourniquet was placed around her upper left arm and left hand and wrist were prepped and draped with DuraPrep and sterile drapes.  Time-out was called and she was identified as correct patient and correct left hand and wrist. We then used an Esmarch to wrap out the hand and tourniquet was inflated 250 mm of pressure.  I then made an incision over the transverse carpal ligament in the  palm upon and carried this proximally and distally.  We were able to dissect down the distal edge of transverse carpal ligament and slowly and meticulously divide it in its entirety finding a significant flat median nerve.  We then irrigated the soft tissue with normal saline solution and closed the skin with interrupted nylon suture.  Xeroform and well-padded sterile dressing was applied. Tourniquet was let down.  The fingers pinked nicely.  She was awakened, extubated, and taken to the recovery room in stable condition.  All final counts were correct.  There were no complications noted.     Vanita Pandahristopher Y. Magnus IvanBlackman, M.D.     CYB/MEDQ  D:  07/03/2016  T:  07/04/2016  Job:  [id number]

## 2016-07-22 ENCOUNTER — Encounter: Payer: Self-pay | Admitting: Internal Medicine

## 2016-07-22 ENCOUNTER — Telehealth: Payer: Self-pay | Admitting: Internal Medicine

## 2016-07-22 NOTE — Telephone Encounter (Signed)
Mailed to patient

## 2016-07-22 NOTE — Telephone Encounter (Signed)
Done hardcopy to Corinne  

## 2016-07-22 NOTE — Telephone Encounter (Signed)
Pt called in said that she always gets a note for Dr Jonny Ruizjohn to excuse her from getting the flu shot.  She works for [employer].  Can a note be done for her without being seen

## 2016-08-06 MED FILL — SUMATRIPTAN SUCC 100 MG TAB: 100 | 30 days supply | Qty: 9 | Fill #1

## 2016-08-06 MED FILL — LEVOTHYROXINE 175 MCG TAB: 175 | 90 days supply | Qty: 90 | Fill #4

## 2016-08-18 ENCOUNTER — Ambulatory Visit (INDEPENDENT_AMBULATORY_CARE_PROVIDER_SITE_OTHER): Payer: 59 | Admitting: Physician Assistant

## 2016-08-18 DIAGNOSIS — G5602 Carpal tunnel syndrome, left upper limb: Secondary | ICD-10-CM

## 2016-08-29 ENCOUNTER — Telehealth: Payer: Self-pay | Admitting: Internal Medicine

## 2016-08-29 ENCOUNTER — Encounter (INDEPENDENT_AMBULATORY_CARE_PROVIDER_SITE_OTHER): Payer: Self-pay

## 2016-08-29 ENCOUNTER — Telehealth (INDEPENDENT_AMBULATORY_CARE_PROVIDER_SITE_OTHER): Payer: Self-pay | Admitting: Orthopaedic Surgery

## 2016-08-29 NOTE — Telephone Encounter (Signed)
Patient aware.

## 2016-08-29 NOTE — Telephone Encounter (Signed)
Pt called in and said that Dr Jonny RuizJohn sometime calls her in Progesterone 100mg .  She bleeding and needs to go back to work Monday and would like to have to that me to help over the weekend?

## 2016-08-29 NOTE — Telephone Encounter (Signed)
The last tx like this was 2015.  I would not feel comfortable with this tx at this time, and it is not normally in my scope of practice.  I would ask pt to make appt with GYN for treatment if needed.

## 2016-08-29 NOTE — Telephone Encounter (Signed)
Patient aware that I wrote new note and [facility] [phone]

## 2016-08-29 NOTE — Telephone Encounter (Signed)
Patient stated that Vanessa [name] has not received her work note and would like to have it refaxed to [employer]  Contact Info: [phone]

## 2016-09-02 ENCOUNTER — Telehealth (INDEPENDENT_AMBULATORY_CARE_PROVIDER_SITE_OTHER): Payer: Self-pay | Admitting: *Deleted

## 2016-09-02 NOTE — Telephone Encounter (Signed)
Pt. Called stating she needs her [employer] paperwork updated. Pt. Callback number is [phone]

## 2016-09-05 DIAGNOSIS — N939 Abnormal uterine and vaginal bleeding, unspecified: Secondary | ICD-10-CM | POA: Diagnosis not present

## 2016-09-05 NOTE — Telephone Encounter (Signed)
Sent patient an Email letting her know I really need new forms

## 2016-09-08 DIAGNOSIS — R938 Abnormal findings on diagnostic imaging of other specified body structures: Secondary | ICD-10-CM | POA: Diagnosis not present

## 2016-09-08 DIAGNOSIS — N938 Other specified abnormal uterine and vaginal bleeding: Secondary | ICD-10-CM | POA: Diagnosis not present

## 2016-09-08 MED FILL — MEDROXYPROGESTERONE 10 MG T: 10 | 30 days supply | Qty: 10 | Fill #0

## 2016-09-09 NOTE — Telephone Encounter (Signed)
Patient called back to see if the paperwork for [employer] has been received.  Contact Info: [phone]

## 2016-09-11 NOTE — Telephone Encounter (Signed)
Sent email to patient letting her know I haven't received forms yet

## 2016-09-15 NOTE — Telephone Encounter (Signed)
PT called stated she faxed these again around 30 min ago and wanted to know if they were received yet.

## 2016-09-16 NOTE — Telephone Encounter (Signed)
Patient aware this has been received, completed and faxed back

## 2016-09-23 ENCOUNTER — Telehealth (INDEPENDENT_AMBULATORY_CARE_PROVIDER_SITE_OTHER): Payer: Self-pay | Admitting: Orthopaedic Surgery

## 2016-09-23 NOTE — Telephone Encounter (Signed)
Patient wants to know if Dr. Magnus IvanBlackman will call in some pain patches and a muscle relaxer. Please give pt a call.

## 2016-09-23 NOTE — Telephone Encounter (Signed)
Please advise 

## 2016-09-24 MED ORDER — LIDOCAINE 5 % EX PTCH: 1.0000 | MEDICATED_PATCH | Freq: Every day | CUTANEOUS | 3 refills | Status: DC | PRN

## 2016-09-24 MED ORDER — CYCLOBENZAPRINE HCL 10 MG PO TABS: 10.0000 mg | ORAL_TABLET | Freq: Three times a day (TID) | ORAL | 0 refills | Status: DC | PRN

## 2016-09-24 MED FILL — CYCLOBENZAPRINE 10 MG TAB: 10 | 30 days supply | Qty: 60 | Fill #0

## 2016-09-24 MED FILL — LIDOCAINE 5% PATCH: 5 | 30 days supply | Qty: 30 | Fill #0
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
## 2016-09-24 NOTE — Addendum Note (Signed)
Addended by: Doneen PoissonBLACKMAN, CHRISTOPHER on: 09/24/2016 09:18 AM   Modules accepted: Orders

## 2016-09-24 NOTE — Telephone Encounter (Signed)
I sent in the meds

## 2016-09-29 ENCOUNTER — Other Ambulatory Visit: Payer: Self-pay | Admitting: Internal Medicine

## 2016-09-29 MED FILL — MEDROXYPROGESTERONE 10 MG T: 10 | 30 days supply | Qty: 10 | Fill #1

## 2016-09-29 MED FILL — VIT D2 1.25 MG (50,000 UNIT: 1.25 MG | 28 days supply | Qty: 4 | Fill #0

## 2016-10-08 ENCOUNTER — Encounter (HOSPITAL_BASED_OUTPATIENT_CLINIC_OR_DEPARTMENT_OTHER): Payer: Self-pay | Admitting: *Deleted

## 2016-10-09 ENCOUNTER — Encounter (HOSPITAL_COMMUNITY): Payer: Self-pay | Admitting: *Deleted

## 2016-10-10 ENCOUNTER — Encounter (HOSPITAL_BASED_OUTPATIENT_CLINIC_OR_DEPARTMENT_OTHER): Admission: RE | Payer: Self-pay | Source: Ambulatory Visit

## 2016-10-10 ENCOUNTER — Ambulatory Visit (HOSPITAL_BASED_OUTPATIENT_CLINIC_OR_DEPARTMENT_OTHER): Admission: RE | Admit: 2016-10-10 | Payer: 59 | Source: Ambulatory Visit | Admitting: Obstetrics and Gynecology

## 2016-10-10 ENCOUNTER — Encounter (HOSPITAL_COMMUNITY): Payer: Self-pay

## 2016-10-10 ENCOUNTER — Encounter (HOSPITAL_COMMUNITY)
Admission: RE | Disposition: A | Discharge status: Home / Self Care | Payer: Self-pay | Source: Ambulatory Visit | Attending: Obstetrics and Gynecology

## 2016-10-10 ENCOUNTER — Ambulatory Visit (HOSPITAL_COMMUNITY): Payer: 59 | Admitting: Anesthesiology

## 2016-10-10 ENCOUNTER — Ambulatory Visit (HOSPITAL_COMMUNITY)
Admission: RE | Admit: 2016-10-10 | Discharge: 2016-10-10 | Disposition: A | Discharge status: Home / Self Care | Payer: 59 | Source: Ambulatory Visit | Attending: Obstetrics and Gynecology | Admitting: Obstetrics and Gynecology

## 2016-10-10 DIAGNOSIS — N938 Other specified abnormal uterine and vaginal bleeding: Secondary | ICD-10-CM | POA: Insufficient documentation

## 2016-10-10 DIAGNOSIS — I10 Essential (primary) hypertension: Secondary | ICD-10-CM | POA: Insufficient documentation

## 2016-10-10 DIAGNOSIS — J45909 Unspecified asthma, uncomplicated: Secondary | ICD-10-CM | POA: Diagnosis not present

## 2016-10-10 DIAGNOSIS — Z6841 Body Mass Index (BMI) 40.0 and over, adult: Secondary | ICD-10-CM | POA: Diagnosis not present

## 2016-10-10 DIAGNOSIS — Z886 Allergy status to analgesic agent status: Secondary | ICD-10-CM | POA: Insufficient documentation

## 2016-10-10 DIAGNOSIS — Z887 Allergy status to serum and vaccine status: Secondary | ICD-10-CM | POA: Insufficient documentation

## 2016-10-10 DIAGNOSIS — M25562 Pain in left knee: Secondary | ICD-10-CM | POA: Diagnosis not present

## 2016-10-10 DIAGNOSIS — Z8249 Family history of ischemic heart disease and other diseases of the circulatory system: Secondary | ICD-10-CM | POA: Insufficient documentation

## 2016-10-10 DIAGNOSIS — M5136 Other intervertebral disc degeneration, lumbar region: Secondary | ICD-10-CM | POA: Diagnosis not present

## 2016-10-10 DIAGNOSIS — F419 Anxiety disorder, unspecified: Secondary | ICD-10-CM | POA: Insufficient documentation

## 2016-10-10 DIAGNOSIS — Z9889 Other specified postprocedural states: Secondary | ICD-10-CM

## 2016-10-10 DIAGNOSIS — N939 Abnormal uterine and vaginal bleeding, unspecified: Secondary | ICD-10-CM | POA: Diagnosis not present

## 2016-10-10 DIAGNOSIS — G8929 Other chronic pain: Secondary | ICD-10-CM | POA: Insufficient documentation

## 2016-10-10 DIAGNOSIS — R938 Abnormal findings on diagnostic imaging of other specified body structures: Secondary | ICD-10-CM | POA: Diagnosis not present

## 2016-10-10 DIAGNOSIS — M47896 Other spondylosis, lumbar region: Secondary | ICD-10-CM | POA: Insufficient documentation

## 2016-10-10 DIAGNOSIS — Z825 Family history of asthma and other chronic lower respiratory diseases: Secondary | ICD-10-CM | POA: Insufficient documentation

## 2016-10-10 DIAGNOSIS — E039 Hypothyroidism, unspecified: Secondary | ICD-10-CM | POA: Diagnosis not present

## 2016-10-10 DIAGNOSIS — M19011 Primary osteoarthritis, right shoulder: Secondary | ICD-10-CM | POA: Insufficient documentation

## 2016-10-10 DIAGNOSIS — M545 Low back pain: Secondary | ICD-10-CM | POA: Diagnosis not present

## 2016-10-10 HISTORY — PX: HYSTEROSCOPY WITH D & C: SHX1775

## 2016-10-10 HISTORY — DX: Cardiac murmur, unspecified: R01.1

## 2016-10-10 LAB — CBC
HCT: 33 % — ABNORMAL LOW (ref 36.0–46.0)
Hemoglobin: 11 g/dL — ABNORMAL LOW (ref 12.0–15.0)
MCH: 27.6 pg (ref 26.0–34.0)
MCHC: 33.3 g/dL (ref 30.0–36.0)
MCV: 82.7 fL (ref 78.0–100.0)
PLATELETS: 381 10*3/uL (ref 150–400)
RBC: 3.99 MIL/uL (ref 3.87–5.11)
RDW: 17 % — AB (ref 11.5–15.5)
WBC: 9.2 10*3/uL (ref 4.0–10.5)

## 2016-10-10 LAB — PREGNANCY, URINE: Preg Test, Ur: NEGATIVE

## 2016-10-10 LAB — TYPE AND SCREEN
ABO/RH(D): O POS
Antibody Screen: NEGATIVE

## 2016-10-10 SURGERY — DILATATION AND CURETTAGE /HYSTEROSCOPY
Anesthesia: General

## 2016-10-10 SURGERY — DILATATION AND CURETTAGE /HYSTEROSCOPY
Anesthesia: Choice

## 2016-10-10 MED ORDER — LIDOCAINE HCL 1 % IJ SOLN
INTRAMUSCULAR | Status: DC | PRN
  Administered 2016-10-10: 10 mL

## 2016-10-10 MED ORDER — ONDANSETRON HCL 4 MG/2ML IJ SOLN
INTRAMUSCULAR | Status: AC
  Filled 2016-10-10: qty 2

## 2016-10-10 MED ORDER — FENTANYL CITRATE (PF) 100 MCG/2ML IJ SOLN
INTRAMUSCULAR | Status: AC
  Filled 2016-10-10: qty 2

## 2016-10-10 MED ORDER — ACETAMINOPHEN-CODEINE 300-30 MG PO TABS: 1.0000 | ORAL_TABLET | ORAL | 0 refills | Status: DC | PRN

## 2016-10-10 MED ORDER — ONDANSETRON HCL 4 MG/2ML IJ SOLN: 4.0000 mg | Freq: Once | INTRAMUSCULAR | Status: DC | PRN

## 2016-10-10 MED ORDER — FENTANYL CITRATE (PF) 100 MCG/2ML IJ SOLN
INTRAMUSCULAR | Status: DC | PRN
  Administered 2016-10-10 (×2): 100 ug via INTRAVENOUS

## 2016-10-10 MED ORDER — PROPOFOL 10 MG/ML IV BOLUS
INTRAVENOUS | Status: DC | PRN
  Administered 2016-10-10: 100 mg via INTRAVENOUS
  Administered 2016-10-10: 300 mg via INTRAVENOUS

## 2016-10-10 MED ORDER — SODIUM CHLORIDE 0.9 % IR SOLN
Status: DC | PRN
  Administered 2016-10-10: 3000 mL

## 2016-10-10 MED ORDER — LIDOCAINE HCL (CARDIAC) 20 MG/ML IV SOLN
INTRAVENOUS | Status: DC | PRN
  Administered 2016-10-10: 50 mg via INTRAVENOUS

## 2016-10-10 MED ORDER — LACTATED RINGERS IV SOLN
INTRAVENOUS | Status: DC
  Administered 2016-10-10: 100 mL/h via INTRAVENOUS
  Administered 2016-10-10: 17:00:00 via INTRAVENOUS

## 2016-10-10 MED ORDER — HYDROCODONE-ACETAMINOPHEN 5-325 MG PO TABS: 1.0000 | ORAL_TABLET | ORAL | Status: DC | PRN

## 2016-10-10 MED ORDER — LIDOCAINE HCL 1 % IJ SOLN
INTRAMUSCULAR | Status: AC
  Filled 2016-10-10: qty 20

## 2016-10-10 MED ORDER — HYDROCODONE-ACETAMINOPHEN 7.5-325 MG PO TABS
ORAL_TABLET | ORAL | Status: AC
  Filled 2016-10-10: qty 1

## 2016-10-10 MED ORDER — ACETAMINOPHEN 325 MG PO TABS: 325.0000 mg | ORAL_TABLET | ORAL | Status: DC | PRN

## 2016-10-10 MED ORDER — FENTANYL CITRATE (PF) 100 MCG/2ML IJ SOLN
25.0000 ug | INTRAMUSCULAR | Status: DC | PRN
  Administered 2016-10-10 (×2): 50 ug via INTRAVENOUS
  Administered 2016-10-10 (×2): 25 ug via INTRAVENOUS

## 2016-10-10 MED ORDER — LIDOCAINE HCL (CARDIAC) 20 MG/ML IV SOLN
INTRAVENOUS | Status: AC
  Filled 2016-10-10: qty 5

## 2016-10-10 MED ORDER — HYDROCODONE-ACETAMINOPHEN 7.5-325 MG PO TABS
1.0000 | ORAL_TABLET | Freq: Once | ORAL | Status: AC | PRN
  Administered 2016-10-10: 1 via ORAL

## 2016-10-10 MED ORDER — MIDAZOLAM HCL 2 MG/2ML IJ SOLN
INTRAMUSCULAR | Status: DC | PRN
  Administered 2016-10-10: 2 mg via INTRAVENOUS

## 2016-10-10 MED ORDER — ONDANSETRON HCL 4 MG/2ML IJ SOLN
INTRAMUSCULAR | Status: DC | PRN
  Administered 2016-10-10: 4 mg via INTRAVENOUS

## 2016-10-10 MED ORDER — PROPOFOL 10 MG/ML IV BOLUS
INTRAVENOUS | Status: AC
  Filled 2016-10-10: qty 40

## 2016-10-10 MED ORDER — HYDROCODONE-ACETAMINOPHEN 5-325 MG PO TABS: 1.0000 | ORAL_TABLET | Freq: Four times a day (QID) | ORAL | Status: DC | PRN

## 2016-10-10 MED ORDER — MIDAZOLAM HCL 2 MG/2ML IJ SOLN
INTRAMUSCULAR | Status: AC
  Filled 2016-10-10: qty 2

## 2016-10-10 MED ORDER — MEPERIDINE HCL 25 MG/ML IJ SOLN: 6.2500 mg | INTRAMUSCULAR | Status: DC | PRN

## 2016-10-10 MED ORDER — ACETAMINOPHEN 160 MG/5ML PO SOLN: 325.0000 mg | ORAL | Status: DC | PRN

## 2016-10-10 SURGICAL SUPPLY — 16 items
CANISTER SUCT 3000ML (MISCELLANEOUS) ×2 IMPLANT
CATH ROBINSON RED A/P 16FR (CATHETERS) ×2 IMPLANT
CLOTH BEACON ORANGE TIMEOUT ST (SAFETY) ×2 IMPLANT
CONTAINER PREFILL 10% NBF 60ML (FORM) ×4 IMPLANT
ELECT REM PT RETURN 9FT ADLT (ELECTROSURGICAL) ×2
ELECTRODE REM PT RTRN 9FT ADLT (ELECTROSURGICAL) ×1 IMPLANT
GLOVE BIO SURGEON STRL SZ 6.5 (GLOVE) ×2 IMPLANT
GLOVE BIOGEL PI IND STRL 7.0 (GLOVE) ×2 IMPLANT
GLOVE BIOGEL PI INDICATOR 7.0 (GLOVE) ×2
GOWN STRL REUS W/TWL LRG LVL3 (GOWN DISPOSABLE) ×4 IMPLANT
PACK VAGINAL MINOR WOMEN LF (CUSTOM PROCEDURE TRAY) ×2 IMPLANT
PAD OB MATERNITY 4.3X12.25 (PERSONAL CARE ITEMS) ×2 IMPLANT
TOWEL OR 17X24 6PK STRL BLUE (TOWEL DISPOSABLE) ×4 IMPLANT
TUBING AQUILEX INFLOW (TUBING) ×2 IMPLANT
TUBING AQUILEX OUTFLOW (TUBING) ×2 IMPLANT
WATER STERILE IRR 1000ML POUR (IV SOLUTION) ×2 IMPLANT

## 2016-10-10 NOTE — Anesthesia Preprocedure Evaluation (Signed)
Anesthesia Evaluation  Patient identified by MRN, date of birth, ID band Patient awake    Reviewed: Allergy & Precautions, H&P , NPO status , Patient's Chart, lab work & pertinent test results  History of Anesthesia Complications (+) AWARENESS UNDER ANESTHESIA  Airway Mallampati: II  TM Distance: >3 FB Neck ROM: full    Dental no notable dental hx.    Pulmonary    Pulmonary exam normal breath sounds clear to auscultation       Cardiovascular Exercise Tolerance: Good hypertension, Pt. on medications Normal cardiovascular exam     Neuro/Psych    GI/Hepatic negative GI ROS, Neg liver ROS,   Endo/Other  negative endocrine ROSHypothyroidism Morbid obesity  Renal/GU negative Renal ROS  negative genitourinary   Musculoskeletal   Abdominal (+) + obese,   Peds  Hematology   Anesthesia Other Findings   Reproductive/Obstetrics negative OB ROS                             Anesthesia Physical Anesthesia Plan  ASA: III  Anesthesia Plan: General   Post-op Pain Management:    Induction:   Airway Management Planned: LMA  Additional Equipment:   Intra-op Plan:   Post-operative Plan:   Informed Consent: I have reviewed the patients History and Physical, chart, labs and discussed the procedure including the risks, benefits and alternatives for the proposed anesthesia with the patient or authorized representative who has indicated his/her understanding and acceptance.     Plan Discussed with: CRNA and Surgeon  Anesthesia Plan Comments:         Anesthesia Quick Evaluation

## 2016-10-10 NOTE — Discharge Instructions (Addendum)
Nothing in vagina for 6 weeks.  No sex, tampons, and douching.    DISCHARGE INSTRUCTIONS: HYSTEROSCOPY / ENDOMETRIAL ABLATION The following instructions have been prepared to help you care for yourself upon your return home.  May take stool softner while taking narcotic pain medication to prevent constipation.  Drink plenty of water.  Personal hygiene:  Use sanitary pads for vaginal drainage, not tampons.  Shower the day after your procedure.  NO tub baths, pools or Jacuzzis for 2-3 weeks.  Wipe front to back after using the bathroom.  Activity and limitations:  Do NOT drive or operate any equipment for 24 hours. The effects of anesthesia are still present and drowsiness may result.  Do NOT rest in bed all day.  Walking is encouraged.  Walk up and down stairs slowly.  You may resume your normal activity in one to two days or as indicated by your physician. Sexual activity: NO intercourse for at least 2 weeks after the procedure, or as indicated by your Doctor.  Diet: Eat a light meal as desired this evening. You may resume your usual diet tomorrow.  Return to Work: You may resume your work activities in one to two days or as indicated by Therapist, sportsyour Doctor.  What to expect after your surgery: Expect to have vaginal bleeding/discharge for 2-3 days and spotting for up to 10 days. It is not unusual to have soreness for up to 1-2 weeks. You may have a slight burning sensation when you urinate for the first day. Mild cramps may continue for a couple of days. You may have a regular period in 2-6 weeks.  Call your doctor for any of the following:  Excessive vaginal bleeding or clotting, saturating and changing one pad every hour.  Inability to urinate 6 hours after discharge from hospital.  Pain not relieved by pain medication.  Fever of 100.4 F or greater.  Unusual vaginal discharge or odor.  Return to office _________________Call for an appointment  ___________________ Patients signature: ______________________ Nurses signature ________________________  Post Anesthesia Care Unit [phone]   Post Anesthesia Home Care Instructions  Activity: Get plenty of rest for the remainder of the day. A responsible adult should stay with you for 24 hours following the procedure.  For the next 24 hours, DO NOT: -Drive a car -Advertising copywriterperate machinery -Drink alcoholic beverages -Take any medication unless instructed by your physician -Make any legal decisions or sign important papers.  Meals: Start with liquid foods such as gelatin or soup. Progress to regular foods as tolerated. Avoid greasy, spicy, heavy foods. If nausea and/or vomiting occur, drink only clear liquids until the nausea and/or vomiting subsides. Call your physician if vomiting continues.  Special Instructions/Symptoms: Your throat may feel dry or sore from the anesthesia or the breathing tube placed in your throat during surgery. If this causes discomfort, gargle with warm salt water. The discomfort should disappear within 24 hours.

## 2016-10-10 NOTE — Transfer of Care (Signed)
Immediate Anesthesia Transfer of Care Note  Patient: Vanessa PillowStacey C [name]  Procedure(s) Performed: Procedure(s): DILATATION AND CURETTAGE /HYSTEROSCOPY (N/A)  Patient Location: PACU  Anesthesia Type:General  Level of Consciousness: awake, alert  and oriented  Airway & Oxygen Therapy: Patient Spontanous Breathing and Patient connected to nasal cannula oxygen  Post-op Assessment: Report given to RN and Post -op Vital signs reviewed and stable  Post vital signs: Reviewed and stable  Last Vitals:  Vitals:   10/10/16 1422 10/10/16 1645  BP: 127/72   Pulse: 69 82  Resp: 16 (!) 23  Temp: 36.8 C 36.6 C    Last Pain:  Vitals:   10/10/16 1645  TempSrc:   PainSc: 8       Patients Stated Pain Goal: 5 (10/10/16 1645)  Complications: No apparent anesthesia complications

## 2016-10-10 NOTE — Interval H&P Note (Signed)
History and Physical Interval Note:  10/10/2016 4:02 PM  Vanessa [name]  has presented today for surgery, with the diagnosis of bleeding   The various methods of treatment have been discussed with the patient and family. After consideration of risks, benefits and other options for treatment, the patient has consented to  Procedure(s): DILATATION AND CURETTAGE /HYSTEROSCOPY (N/A); possible polypectomy as a surgical intervention .  The patient's history has been reviewed, patient examined, no change in status, stable for surgery.  I have reviewed the patient's chart and labs.  Questions were answered to the patient's satisfaction.     Vanessa [name]

## 2016-10-10 NOTE — Anesthesia Procedure Notes (Signed)
Procedure Name: LMA Insertion Date/Time: 10/10/2016 4:17 PM Performed by: [name], Vanessa Seivert M Pre-anesthesia Checklist: Patient identified, Emergency Drugs available, Suction available, Patient being monitored and Timeout performed Patient Re-evaluated:Patient Re-evaluated prior to inductionOxygen Delivery Method: Circle system utilized Preoxygenation: Pre-oxygenation with 100% oxygen Intubation Type: IV induction LMA: LMA inserted LMA Size: 4.0 Number of attempts: 2 Airway Equipment and Method: Patient positioned with wedge pillow Placement Confirmation: positive ETCO2 and breath sounds checked- equal and bilateral Tube secured with: Tape Dental Injury: Teeth and Oropharynx as per pre-operative assessment
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
## 2016-10-10 NOTE — Op Note (Signed)
Operative Note    Preoperative Diagnosis: DUB   Postoperative Diagnosis: DUB   Procedure: Hysteroscopy D&C   Surgeon: Dr Mindi SlickerBanga  DO  Anesthesia: LMA  Fluids: 700ml LR EBL: scant UOP: 100ml IVF: deficit 150ml  Findings: follicular phase endometrium, grossly normal otherwise; ostia in normal position   Specimen: endometrial currettings   Procedure Note Consent was verified with pt in preop  Patient was taken to the operating room and  then placed in the dorsal lithotomy position. She was prepped and draped in the normal sterile fashion. An appropriate time out was performed. An exam under anesthesia noted an anteverted uterus.  A speculum was then placed within the vagina and the anterior lip of the cervix identified and grasped with a single toothed tenaculum. Uterus was then sounded to 12cm.  The Pratt dilators were utilized to dilate the cervix up to approximately 25. The hysteroscope was then advanced into the uterine cavity with findings as noted above: ostia in normal anatomy position, minimal endometrial tissue noted appears to be in follicular phase; no polyps or fibroids.  A sharp currettage was performed till a gritty texture noted in all four quadrants. At this point all instruments were removed from the vagina.  The tenaculum site was noted to be hemostatic.  There was scant to no bleeding from cervix. The speculum was removed from the vagina and the patient awakened and taken to the recovery room in good condition.  Counts correct per nursing staff

## 2016-10-11 ENCOUNTER — Encounter (HOSPITAL_COMMUNITY): Payer: Self-pay | Admitting: Obstetrics and Gynecology

## 2016-10-11 LAB — ABO/RH: ABO/RH(D): O POS

## 2016-10-11 NOTE — Anesthesia Postprocedure Evaluation (Addendum)
Anesthesia Post Note  Patient: Vanessa PillowStacey C [name]  Procedure(s) Performed: Procedure(s) (LRB): DILATATION AND CURETTAGE /HYSTEROSCOPY (N/A)  Patient location during evaluation: PACU Anesthesia Type: General Level of consciousness: awake Pain management: pain level controlled Vital Signs Assessment: post-procedure vital signs reviewed and stable Respiratory status: spontaneous breathing Cardiovascular status: stable Postop Assessment: no signs of nausea or vomiting Anesthetic complications: no     Last Vitals:  Vitals:   10/10/16 1815 10/10/16 1906  BP: 92/67 (!) 141/86  Pulse: (!) 59 (!) 56  Resp: 17 16  Temp: 37.2 C 36.6 C    Last Pain:  Vitals:   10/10/16 1906  TempSrc:   PainSc: 3    Pain Goal: Patients Stated Pain Goal: 5 (10/10/16 1906)               Tullio Chausse JR,JOHN Susann GivensFRANKLIN

## 2016-10-16 ENCOUNTER — Encounter: Payer: Self-pay | Admitting: Internal Medicine

## 2016-12-04 ENCOUNTER — Other Ambulatory Visit: Payer: Self-pay | Admitting: Internal Medicine

## 2016-12-04 ENCOUNTER — Other Ambulatory Visit (INDEPENDENT_AMBULATORY_CARE_PROVIDER_SITE_OTHER): Payer: Self-pay | Admitting: Physician Assistant

## 2016-12-04 MED FILL — LOSARTAN POTASSIUM 100 MG T: 100 | 90 days supply | Qty: 90 | Fill #0

## 2016-12-04 MED FILL — HYDROCHLOROTHIAZIDE 25 MG T: 25 | 90 days supply | Qty: 90 | Fill #0

## 2016-12-04 MED FILL — SUMATRIPTAN SUCC 100 MG TAB: 100 | 30 days supply | Qty: 9 | Fill #2

## 2016-12-04 MED FILL — traMADol HCL 50 MG TABS: 50 | 20 days supply | Qty: 60 | Fill #0

## 2016-12-04 MED FILL — LEVOTHYROXINE 175 MCG TABLE: 175 | 90 days supply | Qty: 90 | Fill #0

## 2016-12-04 NOTE — Telephone Encounter (Signed)
Please advise 

## 2016-12-04 NOTE — Telephone Encounter (Signed)
faxed

## 2016-12-04 NOTE — Telephone Encounter (Signed)
Done hardcopy to Corinne  

## 2016-12-05 MED FILL — CYCLOBENZAPRINE 10 MG TAB: 10 | 15 days supply | Qty: 30 | Fill #0

## 2016-12-19 ENCOUNTER — Telehealth (INDEPENDENT_AMBULATORY_CARE_PROVIDER_SITE_OTHER): Payer: Self-pay | Admitting: Orthopaedic Surgery

## 2016-12-19 NOTE — Telephone Encounter (Signed)
Please advise 

## 2016-12-19 NOTE — Telephone Encounter (Signed)
Patient would like to request something stronger than Tramadol

## 2016-12-19 NOTE — Telephone Encounter (Signed)
No answer & Unable to lm, mailbox not set up

## 2016-12-19 NOTE — Telephone Encounter (Signed)
The only other thing we can prescribe is tylenol #3, and only once.  1-2 BID prn pain, #60.

## 2016-12-22 MED FILL — ACETAMINOPHEN/COD #3 TABLET: 300-30 | 15 days supply | Qty: 60 | Fill #0

## 2016-12-22 NOTE — Telephone Encounter (Signed)
Patient aware I called into pharmacy

## 2017-02-18 ENCOUNTER — Ambulatory Visit (INDEPENDENT_AMBULATORY_CARE_PROVIDER_SITE_OTHER): Payer: 59 | Admitting: Internal Medicine

## 2017-02-18 ENCOUNTER — Encounter (INDEPENDENT_AMBULATORY_CARE_PROVIDER_SITE_OTHER): Payer: Self-pay

## 2017-02-18 ENCOUNTER — Encounter: Payer: Self-pay | Admitting: Internal Medicine

## 2017-02-18 ENCOUNTER — Telehealth (INDEPENDENT_AMBULATORY_CARE_PROVIDER_SITE_OTHER): Payer: Self-pay | Admitting: *Deleted

## 2017-02-18 VITALS — BP 108/68 | HR 103 | Temp 98.6°F | Resp 16 | Ht 65.0 in | Wt 339.0 lb

## 2017-02-18 DIAGNOSIS — J0100 Acute maxillary sinusitis, unspecified: Secondary | ICD-10-CM

## 2017-02-18 DIAGNOSIS — L309 Dermatitis, unspecified: Secondary | ICD-10-CM | POA: Diagnosis not present

## 2017-02-18 MED ORDER — CEFDINIR 300 MG PO CAPS: 300.0000 mg | ORAL_CAPSULE | Freq: Two times a day (BID) | ORAL | 0 refills | Status: DC

## 2017-02-18 MED ORDER — HYDROCODONE-HOMATROPINE 5-1.5 MG/5ML PO SYRP: 5.0000 mL | ORAL_SOLUTION | Freq: Three times a day (TID) | ORAL | 0 refills | Status: DC | PRN

## 2017-02-18 MED ORDER — FLUTICASONE PROPIONATE 0.05 % EX CREA: TOPICAL_CREAM | CUTANEOUS | 1 refills | Status: DC

## 2017-02-18 MED FILL — FLUTICASONE PROP 0.05% CRM: 0.05 | 10 days supply | Qty: 30 | Fill #0

## 2017-02-18 MED FILL — CEFDINIR 300 MG CAPSULE: 300 | 10 days supply | Qty: 20 | Fill #0

## 2017-02-18 NOTE — Telephone Encounter (Signed)
Ok for work note? 

## 2017-02-18 NOTE — Telephone Encounter (Signed)
Please advise 

## 2017-02-18 NOTE — Telephone Encounter (Signed)
Faxed to [employer].

## 2017-02-18 NOTE — Progress Notes (Signed)
Subjective:  Patient ID: Vanessa [name], female    DOB: July 25, 1971  Age: 46 y.o. MRN: [id number]  CC: Sinusitis   HPI Vanessa [name] presents for a 5 day history of left facial pain with nonproductive cough and chills. She has a runny nose that's producing thick yellow-green phlegm.  Outpatient Medications Prior to Visit  Medication Sig Dispense Refill  . Acetaminophen-Codeine 300-30 MG tablet Take 1 tablet by mouth every 4 (four) hours as needed for pain. 20 tablet 0  . Azelastine-Fluticasone (DYMISTA) 137-50 MCG/ACT SUSP Place 2 sprays into both nostrils at bedtime. 1 Bottle 0  . celecoxib (CELEBREX) 200 MG capsule Take 1 capsule (200 mg total) by mouth every 12 (twelve) hours. 30 capsule 1  . cyclobenzaprine (FLEXERIL) 10 MG tablet TAKE 1/2 TO 1 TABLET BY MOUTH TWICE A DAY AS NEEDED FOR SPASM 30 tablet 0  . hydrochlorothiazide (HYDRODIURIL) 25 MG tablet TAKE 1 TABLET BY MOUTH DAILY. 90 tablet 1  . levothyroxine (SYNTHROID, LEVOTHROID) 175 MCG tablet TAKE 1 TABLET BY MOUTH ONCE DAILY 90 tablet 1  . lidocaine (LIDODERM) 5 % Place 1 patch onto the skin daily as needed (for pain). Remove & Discard patch within 12 hours or as directed by MD 30 patch 3  . losartan (COZAAR) 100 MG tablet TAKE 1 TABLET BY MOUTH DAILY. 90 tablet 1  . QVAR 80 MCG/ACT inhaler INHALE 1 PUFF INTO THE LUNGS AS NEEDED. (Patient taking differently: INHALE 1 PUFF INTO THE LUNGS AS NEEDED FOR SHORTNESS OF BREATH) 8.7 g 11  . SUMAtriptan (IMITREX) 100 MG tablet Take 1 tablet (100 mg total) by mouth every 2 (two) hours as needed for migraine or headache. May repeat in 2 hours if headache persists or recurs. 10 tablet 11  . traMADol (ULTRAM) 50 MG tablet TAKE 1 TABELT BY MOUTH EVERY 8 HOURS AS NEEDED 60 tablet 1  . VENTOLIN HFA 108 (90 Base) MCG/ACT inhaler INHALE 2 PUFFS INTO THE LUNGS EVERY 6 (SIX) HOURS AS NEEDED. (Patient taking differently: INHALE 2 PUFFS INTO THE LUNGS EVERY 6 (SIX) HOURS AS NEEDED FOR SHORTNESS OF  BREATH) 18 g 11  . Vitamin D, Ergocalciferol, (DRISDOL) 50000 units CAPS capsule Take 1 capsule (50,000 Units total) by mouth every 7 (seven) days. Overdue for yearly physical w/labs must see Md for future refills 4 capsule 0  . fluticasone (CUTIVATE) 0.05 % cream APPLY TOPICALLY 2 (TWO) TIMES DAILY. 30 g 2   No facility-administered medications prior to visit.     ROS Review of Systems  Constitutional: Positive for chills. Negative for fatigue and fever.  HENT: Positive for rhinorrhea, sinus pain and sinus pressure. Negative for congestion, ear pain, sore throat and trouble swallowing.   Eyes: Negative.   Respiratory: Positive for cough. Negative for chest tightness, shortness of breath and wheezing.   Cardiovascular: Negative for chest pain, palpitations and leg swelling.  Gastrointestinal: Negative for abdominal pain, constipation, diarrhea, nausea and vomiting.  Endocrine: Negative.   Genitourinary: Negative.   Musculoskeletal: Negative.  Negative for back pain and neck pain.  Skin: Positive for rash.       She has a chronic itchy rash across her malar surface and the bridge of her nose, this was previously treated with a topical steroid and she request a refill  Allergic/Immunologic: Negative.   Neurological: Negative.  Negative for dizziness, weakness and numbness.  Hematological: Negative for adenopathy. Does not bruise/bleed easily.  Psychiatric/Behavioral: Negative.     Objective:  BP 108/68 (BP  Location: Left Arm, Patient Position: Sitting, Cuff Size: Large)   Pulse (!) 103   Temp 98.6 F (37 C) (Oral)   Resp 16   Ht  (1.651 m)   Wt (!) 339 lb (153.8 kg)   SpO2 98%   BMI 56.41 kg/m   BP Readings from Last 3 Encounters:  02/18/17 108/68  10/10/16 (!) 141/86  07/03/16 112/60    Wt Readings from Last 3 Encounters:  02/18/17 (!) 339 lb (153.8 kg)  10/09/16 (!) 325 lb (147.4 kg)  07/03/16 (!) 350 lb (158.8 kg)    Physical Exam  Constitutional: She is  oriented to person, place, and time.  HENT:  Right Ear: Hearing, tympanic membrane, external ear and ear canal normal.  Left Ear: Hearing, tympanic membrane, external ear and ear canal normal.  Nose: Rhinorrhea present. No mucosal edema. No epistaxis. Right sinus exhibits no maxillary sinus tenderness and no frontal sinus tenderness. Left sinus exhibits maxillary sinus tenderness. Left sinus exhibits no frontal sinus tenderness.  Mouth/Throat: Oropharynx is clear and moist and mucous membranes are normal. Mucous membranes are not dry and not cyanotic. No oropharyngeal exudate, posterior oropharyngeal edema, posterior oropharyngeal erythema or tonsillar abscesses.  Eyes: Conjunctivae are normal. Right eye exhibits no discharge. Left eye exhibits no discharge. No scleral icterus.  Neck: Normal range of motion. Neck supple. No JVD present. No tracheal deviation present. No thyromegaly present.  Cardiovascular: Normal rate, regular rhythm, normal heart sounds and intact distal pulses.  Exam reveals no gallop and no friction rub.   No murmur heard. Pulmonary/Chest: Effort normal and breath sounds normal. No stridor. No respiratory distress. She has no wheezes. She has no rales. She exhibits no tenderness.  Abdominal: Soft. Bowel sounds are normal. She exhibits no distension and no mass. There is no tenderness. There is no rebound and no guarding.  Musculoskeletal: Normal range of motion. She exhibits no edema, tenderness or deformity.  Lymphadenopathy:    She has no cervical adenopathy.  Neurological: She is oriented to person, place, and time.  Skin: Skin is warm and dry. No rash noted. No erythema. No pallor.  Mild scaling is noted over the malar surface and the bridge of the nose  Vitals reviewed.   Lab Results  Component Value Date   WBC 9.2 10/10/2016   HGB 11.0 (L) 10/10/2016   HCT 33.0 (L) 10/10/2016   PLT 381 10/10/2016   GLUCOSE 80 06/26/2016   CHOL 184 07/12/2015   TRIG 98.0  07/12/2015   HDL 67.20 07/12/2015   LDLCALC 97 07/12/2015   ALT 15 07/12/2015   AST 12 07/12/2015   NA 139 06/26/2016   K 4.1 06/26/2016   CL 107 06/26/2016   CREATININE 0.97 06/26/2016   BUN 11 06/26/2016   CO2 24 06/26/2016   TSH 2.02 07/12/2015    No results found.  Assessment & Plan:   Shalena was seen today for sinusitis.  Diagnoses and all orders for this visit:  Eczema of face -     fluticasone (CUTIVATE) 0.05 % cream; APPLY TOPICALLY 2 (TWO) TIMES DAILY.  Acute non-recurrent maxillary sinusitis -     cefdinir (OMNICEF) 300 MG capsule; Take 1 capsule (300 mg total) by mouth 2 (two) times daily. -     HYDROcodone-homatropine (HYCODAN) 5-1.5 MG/5ML syrup; Take 5 mLs by mouth every 8 (eight) hours as needed for cough.   I am having Ms. Trivedi start on cefdinir and HYDROcodone-homatropine. I am also having her maintain her Azelastine-Fluticasone,  celecoxib, VENTOLIN HFA, QVAR, SUMAtriptan, lidocaine, Vitamin D (Ergocalciferol), Acetaminophen-Codeine, traMADol, hydrochlorothiazide, losartan, cyclobenzaprine, levothyroxine, and fluticasone.  Meds ordered this encounter  Medications  . cefdinir (OMNICEF) 300 MG capsule    Sig: Take 1 capsule (300 mg total) by mouth 2 (two) times daily.    Dispense:  20 capsule    Refill:  0  . HYDROcodone-homatropine (HYCODAN) 5-1.5 MG/5ML syrup    Sig: Take 5 mLs by mouth every 8 (eight) hours as needed for cough.    Dispense:  120 mL    Refill:  0  . fluticasone (CUTIVATE) 0.05 % cream    Sig: APPLY TOPICALLY 2 (TWO) TIMES DAILY.    Dispense:  30 g    Refill:  1    Physical due in October must see md for refills     Follow-up: Return in about 3 weeks (around 03/11/2017).  Sanda Linger, MD

## 2017-02-18 NOTE — Patient Instructions (Signed)

## 2017-02-18 NOTE — Telephone Encounter (Signed)
Pt called stating she had to miss work today because she had a flair up from her back and was asking for a note for work for missing 4/18. Pt asking if this could be faxed to [employer], caseworkers name is Ty Hilts

## 2017-02-18 NOTE — Progress Notes (Signed)
Pre visit review using our clinic review tool, if applicable. No additional management support is needed unless otherwise documented below in the visit note. 

## 2017-02-19 MED FILL — HYDROCODONE-HOMATROPINE SYR: 5-1.5 | 8 days supply | Qty: 120 | Fill #0

## 2017-03-02 MED FILL — LIDOCAINE 5% PATCH: 5 | 30 days supply | Qty: 30 | Fill #1

## 2017-03-13 ENCOUNTER — Telehealth (INDEPENDENT_AMBULATORY_CARE_PROVIDER_SITE_OTHER): Payer: Self-pay | Admitting: Orthopaedic Surgery

## 2017-03-13 ENCOUNTER — Encounter (INDEPENDENT_AMBULATORY_CARE_PROVIDER_SITE_OTHER): Payer: Self-pay

## 2017-03-13 NOTE — Telephone Encounter (Signed)
faxed

## 2017-03-13 NOTE — Telephone Encounter (Signed)
Patient called asked if she can get a work note [facility] for today. Patient said she could not go to work today because she had a flair up in her back. Patient [facility] The  Number to contact patient is [phone]

## 2017-04-13 ENCOUNTER — Telehealth (INDEPENDENT_AMBULATORY_CARE_PROVIDER_SITE_OTHER): Payer: Self-pay | Admitting: Orthopaedic Surgery

## 2017-04-13 ENCOUNTER — Other Ambulatory Visit (INDEPENDENT_AMBULATORY_CARE_PROVIDER_SITE_OTHER): Payer: Self-pay | Admitting: Physician Assistant

## 2017-04-13 MED FILL — HYDROCHLOROTHIAZIDE 25 MG T: 25 | 90 days supply | Qty: 90 | Fill #1

## 2017-04-13 MED FILL — SUMATRIPTAN SUCC 100 MG TAB: 100 | 30 days supply | Qty: 9 | Fill #3

## 2017-04-13 MED FILL — LOSARTAN POTASSIUM 100 MG T: 100 | 90 days supply | Qty: 90 | Fill #1

## 2017-04-13 MED FILL — CYCLOBENZAPRINE 10 MG TAB: 10 | 15 days supply | Qty: 30 | Fill #0

## 2017-04-13 MED FILL — LEVOTHYROXINE 175 MCG TABLE: 175 | 90 days supply | Qty: 90 | Fill #1

## 2017-04-13 MED FILL — FLUTICASONE PROP 0.05% CRM: 0.05 | 10 days supply | Qty: 30 | Fill #1

## 2017-04-13 NOTE — Telephone Encounter (Signed)
Rx request 

## 2017-04-13 NOTE — Telephone Encounter (Signed)
Please advise on T#3 refill-  I will send to [employer] to do the rest of the message-

## 2017-04-13 NOTE — Telephone Encounter (Signed)
Patient called advised she was out of work with her neck and back. Patient was out of work 02/18/17. Patient said she was also out of work 03/13/17, 03/22/17 and 03/27/17. Patient want to make sure [employer] approve the dates she has been out of work. Patient also need a Rx refilled Tylenol 3. The number to contact patient is [phone].

## 2017-04-14 NOTE — Telephone Encounter (Signed)
Ok to do

## 2017-04-14 NOTE — Telephone Encounter (Signed)
Called into pharmacy

## 2017-04-27 ENCOUNTER — Ambulatory Visit (INDEPENDENT_AMBULATORY_CARE_PROVIDER_SITE_OTHER): Payer: 59 | Admitting: Orthopaedic Surgery

## 2017-04-27 DIAGNOSIS — M79602 Pain in left arm: Secondary | ICD-10-CM | POA: Diagnosis not present

## 2017-04-27 DIAGNOSIS — M79601 Pain in right arm: Secondary | ICD-10-CM | POA: Diagnosis not present

## 2017-04-27 DIAGNOSIS — M65331 Trigger finger, right middle finger: Secondary | ICD-10-CM | POA: Diagnosis not present

## 2017-04-27 DIAGNOSIS — M65332 Trigger finger, left middle finger: Secondary | ICD-10-CM | POA: Insufficient documentation

## 2017-04-27 MED ORDER — DICLOFENAC SODIUM 1 % TD GEL: 2.0000 g | Freq: Four times a day (QID) | TRANSDERMAL | 3 refills | Status: DC

## 2017-04-27 MED ORDER — METHYLPREDNISOLONE 4 MG PO TABS: ORAL_TABLET | ORAL | 0 refills | Status: DC

## 2017-04-27 MED FILL — METHYLPREDNISOLONE 4 MG TAB: 4 | 6 days supply | Qty: 21 | Fill #0

## 2017-04-27 MED FILL — ACETAMINOPHEN/COD #3 TABLET: 300-30 | 15 days supply | Qty: 30 | Fill #0

## 2017-04-27 MED FILL — DICLOFENAC SODIUM 1% GEL: 1 | 12 days supply | Qty: 100 | Fill #0
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
## 2017-04-27 NOTE — Progress Notes (Signed)
Trigger finger

## 2017-05-20 ENCOUNTER — Telehealth (INDEPENDENT_AMBULATORY_CARE_PROVIDER_SITE_OTHER): Payer: Self-pay | Admitting: Orthopaedic Surgery

## 2017-05-20 NOTE — Telephone Encounter (Signed)
Please advise on note?  

## 2017-05-20 NOTE — Telephone Encounter (Signed)
Patient called asking for a note to be sent over to her work. She said that she has to sit down and take a break ever so often because of her pain and she just wants to cover all the bases with her manager. CB # [phone]  Fax to [phone] [facility]

## 2017-05-20 NOTE — Telephone Encounter (Signed)
I faxed note per patient's request. I attempted to call patient and advise, but voicemail has not been set up. Could not leave message.

## 2017-05-20 NOTE — Telephone Encounter (Signed)
Ok to give a work note for breaks in her day to rest her shoulder.

## 2017-05-21 ENCOUNTER — Telehealth (INDEPENDENT_AMBULATORY_CARE_PROVIDER_SITE_OTHER): Payer: Self-pay | Admitting: Orthopaedic Surgery

## 2017-05-21 NOTE — Telephone Encounter (Signed)
Printed and ready to be mailed out

## 2017-05-21 NOTE — Telephone Encounter (Signed)
Patient called asking if a copy of her work note could be mailed to her address. Thank you. CB # [phone]

## 2017-05-21 NOTE — Telephone Encounter (Signed)
I will mail her the note.

## 2017-05-22 ENCOUNTER — Telehealth (INDEPENDENT_AMBULATORY_CARE_PROVIDER_SITE_OTHER): Payer: Self-pay | Admitting: Orthopaedic Surgery

## 2017-05-22 NOTE — Telephone Encounter (Signed)
fax

## 2017-05-22 NOTE — Telephone Encounter (Signed)
Patient called asked if the note that Dr Magnus IvanBlackman wrote concerning her needing to sit down as needed because of her back issues can be faxed to [employer]. [facility]. The number to contact patient is [phone]

## 2017-05-22 NOTE — Telephone Encounter (Signed)
done

## 2017-06-16 ENCOUNTER — Other Ambulatory Visit (INDEPENDENT_AMBULATORY_CARE_PROVIDER_SITE_OTHER): Payer: Self-pay | Admitting: Physician Assistant

## 2017-06-16 ENCOUNTER — Telehealth (INDEPENDENT_AMBULATORY_CARE_PROVIDER_SITE_OTHER): Payer: Self-pay | Admitting: Orthopaedic Surgery

## 2017-06-16 ENCOUNTER — Other Ambulatory Visit (INDEPENDENT_AMBULATORY_CARE_PROVIDER_SITE_OTHER): Payer: Self-pay

## 2017-06-16 MED ORDER — ACETAMINOPHEN-CODEINE 300-30 MG PO TABS: 1.0000 | ORAL_TABLET | ORAL | 0 refills | Status: DC | PRN

## 2017-06-16 MED FILL — ACETAMINOPHEN/COD #3 TABLET: 300-30 | 15 days supply | Qty: 30 | Fill #0

## 2017-06-16 MED FILL — LIDOCAINE 5% PATCH: 5 | 30 days supply | Qty: 30 | Fill #2

## 2017-06-16 NOTE — Telephone Encounter (Signed)
Called into pharmacy

## 2017-06-16 NOTE — Telephone Encounter (Signed)
Please advise 

## 2017-06-16 NOTE — Telephone Encounter (Signed)
Patient called and asked for a refill on tylenol with codeine, and also the pain patches just to hold her over til her next appointment on Monday. She's in a lot of pain. CB # [phone]

## 2017-06-16 NOTE — Telephone Encounter (Signed)
It is okay to refill Tylenol 3 one to 2 twice daily as needed for pain #60 with no refills as well as icing lidocaine patches.

## 2017-06-18 ENCOUNTER — Telehealth (INDEPENDENT_AMBULATORY_CARE_PROVIDER_SITE_OTHER): Payer: Self-pay

## 2017-06-18 NOTE — Telephone Encounter (Signed)
FYI

## 2017-06-18 NOTE — Telephone Encounter (Signed)
FYI-  Patient called stating that her note needs to be more detailed.  States that note needs to state that she will need to sit down at times while at work when having pain.  Patient states she will wait until her appointment to get the note.  Cb# [phone].

## 2017-06-22 ENCOUNTER — Ambulatory Visit (INDEPENDENT_AMBULATORY_CARE_PROVIDER_SITE_OTHER): Payer: 59

## 2017-06-22 ENCOUNTER — Ambulatory Visit (INDEPENDENT_AMBULATORY_CARE_PROVIDER_SITE_OTHER): Payer: 59 | Admitting: Physician Assistant

## 2017-06-22 ENCOUNTER — Encounter (INDEPENDENT_AMBULATORY_CARE_PROVIDER_SITE_OTHER): Payer: Self-pay | Admitting: Physician Assistant

## 2017-06-22 DIAGNOSIS — M25552 Pain in left hip: Secondary | ICD-10-CM | POA: Diagnosis not present

## 2017-06-22 DIAGNOSIS — G8929 Other chronic pain: Secondary | ICD-10-CM | POA: Diagnosis not present

## 2017-06-22 DIAGNOSIS — M545 Low back pain: Secondary | ICD-10-CM

## 2017-06-22 MED ORDER — NAPROXEN 500 MG PO TABS: 500.0000 mg | ORAL_TABLET | Freq: Two times a day (BID) | ORAL | 1 refills | Status: DC
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
## 2017-06-24 ENCOUNTER — Ambulatory Visit: Payer: 59 | Attending: Internal Medicine | Admitting: Physical Therapy

## 2017-06-24 ENCOUNTER — Encounter: Payer: Self-pay | Admitting: Physical Therapy

## 2017-06-24 DIAGNOSIS — R262 Difficulty in walking, not elsewhere classified: Secondary | ICD-10-CM | POA: Insufficient documentation

## 2017-06-24 DIAGNOSIS — M25552 Pain in left hip: Secondary | ICD-10-CM | POA: Diagnosis not present
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
## 2017-06-25 DIAGNOSIS — R102 Pelvic and perineal pain: Secondary | ICD-10-CM | POA: Diagnosis not present

## 2017-06-25 MED FILL — IBUPROFEN 600 MG TABLET: 600 | 10 days supply | Qty: 30 | Fill #0

## 2017-07-01 ENCOUNTER — Ambulatory Visit: Payer: 59 | Admitting: Physical Therapy

## 2017-07-01 DIAGNOSIS — M25552 Pain in left hip: Secondary | ICD-10-CM | POA: Diagnosis not present

## 2017-07-01 DIAGNOSIS — R262 Difficulty in walking, not elsewhere classified: Secondary | ICD-10-CM | POA: Diagnosis not present

## 2017-07-01 MED FILL — CYCLOBENZAPRINE 10 MG TAB: 10 | 15 days supply | Qty: 30 | Fill #0
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
## 2017-07-08 ENCOUNTER — Ambulatory Visit: Payer: 59 | Attending: Internal Medicine | Admitting: Physical Therapy

## 2017-07-08 DIAGNOSIS — R262 Difficulty in walking, not elsewhere classified: Secondary | ICD-10-CM | POA: Diagnosis not present

## 2017-07-08 DIAGNOSIS — M25552 Pain in left hip: Secondary | ICD-10-CM | POA: Diagnosis not present

## 2017-07-08 NOTE — Patient Instructions (Signed)
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
## 2017-07-09 ENCOUNTER — Telehealth: Payer: Self-pay | Admitting: Internal Medicine

## 2017-07-09 NOTE — Telephone Encounter (Signed)
Pt called stating that she is a [employer] and is due for a flu shot. She said that she is allergic to the flu vaccine and is needing a letter stating that she does not have to get it due to the allergy. She would like this mailed to her home address. Please advise.

## 2017-07-09 NOTE — Telephone Encounter (Signed)
Done hardcopy to Shir[place]  

## 2017-07-09 NOTE — Telephone Encounter (Signed)
[facility]

## 2017-07-09 NOTE — Telephone Encounter (Signed)
She would like it mailed. You can give it to me and I can mail it out.

## 2017-07-09 NOTE — Telephone Encounter (Signed)
Letter is ready for pick-up.

## 2017-07-10 ENCOUNTER — Ambulatory Visit: Payer: 59 | Admitting: Physical Therapy

## 2017-07-10 DIAGNOSIS — M25552 Pain in left hip: Secondary | ICD-10-CM

## 2017-07-10 DIAGNOSIS — R262 Difficulty in walking, not elsewhere classified: Secondary | ICD-10-CM
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
## 2017-07-10 NOTE — Patient Instructions (Signed)
Heel Raises    Stand with support. Tighten pelvic floor and hold. With knees straight, raise heels off ground. Hold __5_ seconds. Relax for _5__ seconds. Repeat __10_ times. Do __2_ times a day. Try a set or 2 with toes turned out to the side.    Copyright  VHI. All rights reserved.

## 2017-07-13 ENCOUNTER — Ambulatory Visit: Payer: 59 | Admitting: Physical Therapy

## 2017-07-15 ENCOUNTER — Encounter: Payer: Self-pay | Admitting: Physical Therapy

## 2017-07-15 ENCOUNTER — Ambulatory Visit: Payer: 59 | Admitting: Physical Therapy

## 2017-07-15 DIAGNOSIS — M25552 Pain in left hip: Secondary | ICD-10-CM

## 2017-07-15 DIAGNOSIS — R262 Difficulty in walking, not elsewhere classified: Secondary | ICD-10-CM | POA: Diagnosis not present
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
## 2017-07-20 ENCOUNTER — Encounter: Payer: Self-pay | Admitting: Physical Therapy

## 2017-07-21 ENCOUNTER — Ambulatory Visit: Payer: 59 | Admitting: Physical Therapy

## 2017-07-21 DIAGNOSIS — R262 Difficulty in walking, not elsewhere classified: Secondary | ICD-10-CM | POA: Diagnosis not present

## 2017-07-21 DIAGNOSIS — M25552 Pain in left hip: Secondary | ICD-10-CM | POA: Diagnosis not present
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
## 2017-07-22 ENCOUNTER — Ambulatory Visit (INDEPENDENT_AMBULATORY_CARE_PROVIDER_SITE_OTHER): Payer: 59 | Admitting: Orthopaedic Surgery

## 2017-07-23 ENCOUNTER — Ambulatory Visit: Payer: 59 | Admitting: Physical Therapy

## 2017-07-27 ENCOUNTER — Ambulatory Visit: Payer: 59 | Admitting: Physical Therapy

## 2017-07-27 ENCOUNTER — Encounter: Payer: Self-pay | Admitting: Physical Therapy

## 2017-07-27 DIAGNOSIS — R262 Difficulty in walking, not elsewhere classified: Secondary | ICD-10-CM | POA: Diagnosis not present

## 2017-07-27 DIAGNOSIS — M25552 Pain in left hip: Secondary | ICD-10-CM
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
## 2017-07-29 ENCOUNTER — Ambulatory Visit: Payer: 59

## 2017-07-29 DIAGNOSIS — Z1151 Encounter for screening for human papillomavirus (HPV): Secondary | ICD-10-CM | POA: Diagnosis not present

## 2017-07-29 DIAGNOSIS — Z1231 Encounter for screening mammogram for malignant neoplasm of breast: Secondary | ICD-10-CM | POA: Diagnosis not present

## 2017-07-29 DIAGNOSIS — Z130 Encounter for screening for diseases of the blood and blood-forming organs and certain disorders involving the immune mechanism: Secondary | ICD-10-CM | POA: Diagnosis not present

## 2017-07-29 DIAGNOSIS — Z124 Encounter for screening for malignant neoplasm of cervix: Secondary | ICD-10-CM | POA: Diagnosis not present

## 2017-07-29 DIAGNOSIS — Z01419 Encounter for gynecological examination (general) (routine) without abnormal findings: Secondary | ICD-10-CM | POA: Diagnosis not present

## 2017-07-29 DIAGNOSIS — R1031 Right lower quadrant pain: Secondary | ICD-10-CM | POA: Diagnosis not present

## 2017-07-30 ENCOUNTER — Telehealth: Payer: Self-pay | Admitting: Internal Medicine
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
## 2017-08-03 ENCOUNTER — Ambulatory Visit (INDEPENDENT_AMBULATORY_CARE_PROVIDER_SITE_OTHER): Payer: 59 | Admitting: Orthopaedic Surgery

## 2017-08-03 DIAGNOSIS — M5442 Lumbago with sciatica, left side: Secondary | ICD-10-CM | POA: Diagnosis not present
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
## 2017-08-18 ENCOUNTER — Telehealth (INDEPENDENT_AMBULATORY_CARE_PROVIDER_SITE_OTHER): Payer: Self-pay | Admitting: Orthopaedic Surgery

## 2017-08-18 NOTE — Telephone Encounter (Signed)
Please advise 

## 2017-08-18 NOTE — Telephone Encounter (Signed)
Ok to refill both of these, #60 as before

## 2017-08-18 NOTE — Telephone Encounter (Signed)
Patient called needing Rx refilled (Flexeril and Tylenol 3) The number to contact patient is [phone]

## 2017-08-19 ENCOUNTER — Other Ambulatory Visit (INDEPENDENT_AMBULATORY_CARE_PROVIDER_SITE_OTHER): Payer: Self-pay

## 2017-08-19 MED ORDER — ACETAMINOPHEN-CODEINE 300-30 MG PO TABS: 1.0000 | ORAL_TABLET | ORAL | 0 refills | Status: DC | PRN

## 2017-08-19 MED ORDER — CYCLOBENZAPRINE HCL 10 MG PO TABS: ORAL_TABLET | ORAL | 0 refills | Status: DC

## 2017-08-19 MED FILL — CYCLOBENZAPRINE 10 MG TAB: 10 | 15 days supply | Qty: 30 | Fill #0

## 2017-08-19 MED FILL — ACETAMINOPHEN/COD #3 TABLET: 300-30 | 4 days supply | Qty: 20 | Fill #0

## 2017-08-19 NOTE — Telephone Encounter (Signed)
Called into pharmacy

## 2017-08-20 ENCOUNTER — Other Ambulatory Visit: Payer: Self-pay | Admitting: Internal Medicine

## 2017-08-20 MED ORDER — LEVOTHYROXINE SODIUM 175 MCG PO TABS: 175.0000 ug | ORAL_TABLET | Freq: Every day | ORAL | 0 refills | Status: DC

## 2017-08-20 MED ORDER — HYDROCHLOROTHIAZIDE 25 MG PO TABS: 25.0000 mg | ORAL_TABLET | Freq: Every day | ORAL | 0 refills | Status: DC

## 2017-08-20 MED ORDER — LOSARTAN POTASSIUM 100 MG PO TABS: 100.0000 mg | ORAL_TABLET | Freq: Every day | ORAL | 0 refills | Status: DC

## 2017-08-20 MED FILL — LOSARTAN POTASSIUM 100 MG T: 100 | 30 days supply | Qty: 30 | Fill #0

## 2017-08-20 MED FILL — HYDROCHLOROTHIAZIDE 25 MG T: 25 | 30 days supply | Qty: 30 | Fill #0

## 2017-08-20 MED FILL — LIDOCAINE 5% PATCH: 5 | 30 days supply | Qty: 30 | Fill #3

## 2017-08-20 MED FILL — LEVOTHYROXINE 175 MCG TABLE: 175 | 30 days supply | Qty: 30 | Fill #0

## 2017-08-20 NOTE — Telephone Encounter (Signed)
Per office policy sent 30 day to local pharmacy until appt.../lmb  

## 2017-08-20 NOTE — Telephone Encounter (Signed)
Pt called regarding this, appt made on 11/1 Please advise  POF

## 2017-08-20 NOTE — Addendum Note (Signed)
Addended by: Deatra JamesBRAND, Aamari Strawderman M on: 08/20/2017 10:17 AM   Modules accepted: Orders

## 2017-08-25 ENCOUNTER — Other Ambulatory Visit: Payer: Self-pay | Admitting: Internal Medicine

## 2017-08-25 MED FILL — VENTOLIN HFA 90 MCG INHALER: 108 (90 BAS | 25 days supply | Qty: 18 | Fill #0

## 2017-08-25 MED FILL — QVAR REDIHALER 80 MCG/ACT A: 80 | 30 days supply | Qty: 11 | Fill #0

## 2017-09-03 ENCOUNTER — Ambulatory Visit: Payer: Self-pay | Admitting: Internal Medicine

## 2017-09-07 ENCOUNTER — Telehealth: Payer: Self-pay | Admitting: Internal Medicine

## 2017-09-07 NOTE — Telephone Encounter (Signed)
Patient called in crying and upset because her mother just passed away. She wanted to know if she could have some medication for anxiety. I informed I could ask, but most likely due to the kind of medication she is asking for she would need an appointment. I offender to make a sooner appointment for her. She declined.

## 2017-09-07 NOTE — Telephone Encounter (Signed)
Unfortunately would need ROV as this is a new problem, and pt has not been seen since July 2017

## 2017-09-08 NOTE — Telephone Encounter (Signed)
Patient has been informed of this.  

## 2017-09-17 ENCOUNTER — Ambulatory Visit (INDEPENDENT_AMBULATORY_CARE_PROVIDER_SITE_OTHER): Payer: 59 | Admitting: Internal Medicine

## 2017-09-17 ENCOUNTER — Encounter: Payer: Self-pay | Admitting: Internal Medicine

## 2017-09-17 ENCOUNTER — Other Ambulatory Visit: Payer: Self-pay | Admitting: Internal Medicine

## 2017-09-17 ENCOUNTER — Other Ambulatory Visit (INDEPENDENT_AMBULATORY_CARE_PROVIDER_SITE_OTHER): Payer: 59

## 2017-09-17 VITALS — BP 124/88 | HR 80 | Temp 98.2°F | Ht 65.0 in | Wt 349.0 lb

## 2017-09-17 DIAGNOSIS — E039 Hypothyroidism, unspecified: Secondary | ICD-10-CM

## 2017-09-17 DIAGNOSIS — Z0001 Encounter for general adult medical examination with abnormal findings: Secondary | ICD-10-CM

## 2017-09-17 DIAGNOSIS — Z0000 Encounter for general adult medical examination without abnormal findings: Secondary | ICD-10-CM

## 2017-09-17 DIAGNOSIS — F4321 Adjustment disorder with depressed mood: Secondary | ICD-10-CM

## 2017-09-17 DIAGNOSIS — I10 Essential (primary) hypertension: Secondary | ICD-10-CM | POA: Diagnosis not present

## 2017-09-17 DIAGNOSIS — F4320 Adjustment disorder, unspecified: Secondary | ICD-10-CM | POA: Insufficient documentation

## 2017-09-17 LAB — CBC WITH DIFFERENTIAL/PLATELET
BASOS PCT: 1 % (ref 0.0–3.0)
Basophils Absolute: 0.1 10*3/uL (ref 0.0–0.1)
EOS ABS: 0.2 10*3/uL (ref 0.0–0.7)
EOS PCT: 2.2 % (ref 0.0–5.0)
HCT: 39 % (ref 36.0–46.0)
Hemoglobin: 12.6 g/dL (ref 12.0–15.0)
LYMPHS ABS: 2.8 10*3/uL (ref 0.7–4.0)
Lymphocytes Relative: 30.9 % (ref 12.0–46.0)
MCHC: 32.2 g/dL (ref 30.0–36.0)
MCV: 82.3 fl (ref 78.0–100.0)
MONO ABS: 0.7 10*3/uL (ref 0.1–1.0)
Monocytes Relative: 7.4 % (ref 3.0–12.0)
NEUTROS ABS: 5.4 10*3/uL (ref 1.4–7.7)
NEUTROS PCT: 58.5 % (ref 43.0–77.0)
PLATELETS: 261 10*3/uL (ref 150.0–400.0)
RBC: 4.74 Mil/uL (ref 3.87–5.11)
RDW: 19.2 % — AB (ref 11.5–15.5)
WBC: 9.2 10*3/uL (ref 4.0–10.5)

## 2017-09-17 LAB — BASIC METABOLIC PANEL
BUN: 17 mg/dL (ref 6–23)
CALCIUM: 9.7 mg/dL (ref 8.4–10.5)
CO2: 31 mEq/L (ref 19–32)
CREATININE: 1.07 mg/dL (ref 0.40–1.20)
Chloride: 100 mEq/L (ref 96–112)
GFR: 71.11 mL/min (ref >60.00)
Glucose, Bld: 105 mg/dL — ABNORMAL HIGH (ref 70–99)
Potassium: 4.4 mEq/L (ref 3.5–5.1)
Sodium: 138 mEq/L (ref 135–145)

## 2017-09-17 LAB — URINALYSIS, ROUTINE W REFLEX MICROSCOPIC
BILIRUBIN URINE: NEGATIVE
Hgb urine dipstick: NEGATIVE
KETONES UR: NEGATIVE
Leukocytes, UA: NEGATIVE
Nitrite: NEGATIVE
PH: 6 (ref 5.0–8.0)
SPECIFIC GRAVITY, URINE: 1.025 (ref 1.000–1.030)
Total Protein, Urine: NEGATIVE
URINE GLUCOSE: NEGATIVE
UROBILINOGEN UA: 0.2 (ref 0.0–1.0)

## 2017-09-17 LAB — HEPATIC FUNCTION PANEL
ALT: 14 U/L (ref 0–35)
AST: 15 U/L (ref 0–37)
Albumin: 3.8 g/dL (ref 3.5–5.2)
Alkaline Phosphatase: 79 U/L (ref 39–117)
BILIRUBIN DIRECT: 0.1 mg/dL (ref 0.0–0.3)
BILIRUBIN TOTAL: 0.3 mg/dL (ref 0.2–1.2)
Total Protein: 8.5 g/dL — ABNORMAL HIGH (ref 6.0–8.3)

## 2017-09-17 LAB — LIPID PANEL
CHOL/HDL RATIO: 3
CHOLESTEROL: 203 mg/dL — AB (ref 0–200)
HDL: 75 mg/dL (ref >39.00)
LDL Cholesterol: 113 mg/dL — ABNORMAL HIGH (ref 0–99)
NonHDL: 128.13
TRIGLYCERIDES: 74 mg/dL (ref 0.0–149.0)
VLDL: 14.8 mg/dL (ref 0.0–40.0)

## 2017-09-17 LAB — TSH: TSH: 46.62 u[IU]/mL — ABNORMAL HIGH (ref 0.35–4.50)

## 2017-09-17 MED ORDER — LEVOTHYROXINE SODIUM 175 MCG PO TABS: 175.0000 ug | ORAL_TABLET | Freq: Every day | ORAL | 3 refills | Status: DC

## 2017-09-17 MED ORDER — ALPRAZOLAM 0.5 MG PO TABS: 0.5000 mg | ORAL_TABLET | Freq: Two times a day (BID) | ORAL | 0 refills | Status: DC | PRN

## 2017-09-17 MED FILL — ALPRAZolam 0.5 MG TABS: 0.5 | 30 days supply | Qty: 60 | Fill #0

## 2017-09-17 MED FILL — LEVOTHYROXINE 175 MCG TABLE: 175 | 90 days supply | Qty: 90 | Fill #0

## 2017-09-17 NOTE — Patient Instructions (Signed)
You are given the work note today  Please take all new medication as prescribed - the xanax as needed  You will be contacted regarding the referral for: Counseling  Please continue all other medications as before, and refills have been done if requested.  Please have the pharmacy call with any other refills you may need.  Please continue your efforts at being more active, low cholesterol diet, and weight control.  You are otherwise up to date with prevention measures today.  Please keep your appointments with your specialists as you may have planned  Please go to the LAB in the Basement (turn left off the elevator) for the tests to be done today  You will be contacted by phone if any changes need to be made immediately.  Otherwise, you will receive a letter about your results with an explanation, but please check with MyChart first.  Please remember to sign up for MyChart if you have not done so, as this will be important to you in the future with finding out test results, communicating by private email, and scheduling acute appointments online when needed.  Please return in 6 months, or sooner if needed
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
## 2017-09-18 ENCOUNTER — Telehealth: Payer: Self-pay

## 2017-09-18 NOTE — Telephone Encounter (Signed)
-----   Message from Corwin LevinsJames W John, MD sent at 09/17/2017  1:21 PM EST ----- Left message on MyChart, pt to cont same tx except  The test results show that your current treatment is OK, except the thyroid testing shows that you may not have been taking the thryoid medication recently.  I will send a new prescription to take, and then please follow up at the LAB ONLY in 4 weeks to recheck the tests;.    Shirron to please inform pt, I will do rx

## 2017-09-18 NOTE — Telephone Encounter (Signed)
Pt has been informed and expressed understanding.  

## 2017-09-20 NOTE — Assessment & Plan Note (Addendum)
D/w pt, refer for counseling, gave work note, ok for limited short term xanax prn,  to f/u any worsening symptoms or concerns  In addition to the time spent performing CPE, I spent an additional 15 minutes face to face,in which greater than 50% of this time was spent in counseling and coordination of care for patient's illness as documented, including the differential dx, tx, further evaluation and other management of grief, anxiety and depressed mood, hypothyroidism and HTN

## 2017-09-20 NOTE — Assessment & Plan Note (Signed)
stable overall by history and exam, recent data reviewed with pt, and pt to continue medical treatment as before,  to f/u any worsening symptoms or concerns BP Readings from Last 3 Encounters:  09/17/17 124/88  02/18/17 108/68  10/10/16 (!) 141/86

## 2017-09-20 NOTE — Assessment & Plan Note (Signed)

## 2017-09-20 NOTE — Assessment & Plan Note (Signed)
stable overall by history and exam, and pt to continue medical treatment as before,  to f/u any worsening symptoms or concerns, may have missed dosing recently in her grief, for f/u lab today

## 2017-09-21 ENCOUNTER — Telehealth (INDEPENDENT_AMBULATORY_CARE_PROVIDER_SITE_OTHER): Payer: Self-pay | Admitting: Orthopaedic Surgery

## 2017-09-21 MED ORDER — ALPRAZOLAM 0.5 MG PO TABS
0.5000 mg | ORAL_TABLET | Freq: Two times a day (BID) | ORAL | 0 refills | Status: DC | PRN
Start: 2017-09-21 — End: 2020-03-07

## 2017-09-21 NOTE — Telephone Encounter (Signed)
Can you please resend the xanax script for patient. Thank!

## 2017-09-21 NOTE — Telephone Encounter (Signed)
Patient has called back.  States pharmacy does not have script.  [facility]  Please follow back up in regard with patient [phone].

## 2017-09-21 NOTE — Telephone Encounter (Signed)
I am assuming this about a recent form? If so, ok to add day

## 2017-09-21 NOTE — Telephone Encounter (Signed)
Patient said she is covered through [employer], needs an additional day covered since she was in a lot of pain.  CB# [phone].

## 2017-09-21 NOTE — Telephone Encounter (Signed)
Do you know if [employer] faxed something else on her this morning? I just sent them her extension paperwork Friday.

## 2017-09-21 NOTE — Telephone Encounter (Signed)
I think that is what's she's talking about adding a day to

## 2017-09-21 NOTE — Addendum Note (Signed)
Addended by: Corwin LevinsJOHN, Jaylun Fleener W on: 09/21/2017 05:14 PM   Modules accepted: Orders

## 2017-09-21 NOTE — Addendum Note (Signed)
Addended by: Roney MansGAY, SHIRRON on: 09/21/2017 03:00 PM   Modules accepted: Orders

## 2017-09-21 NOTE — Telephone Encounter (Signed)
Ok, done erx 

## 2017-09-22 ENCOUNTER — Telehealth: Payer: Self-pay | Admitting: Internal Medicine

## 2017-09-22 NOTE — Telephone Encounter (Signed)
I have received FMLA via fax from [employer]. I have called patient to see what she was requesting. I LVM for patient to call back.  Dr. Jonny RuizJohn are you aware of patient requesting FMLA? I see the patient was seen on 11/5 and her mother passed away in Nov. If so please advise. Thank you.

## 2017-09-22 NOTE — Telephone Encounter (Signed)
Patient states that she was to get a new medication for thyroid medication.  States her pharmacy has not received this.  Please follow up in regard on what patient is to take?

## 2017-09-22 NOTE — Telephone Encounter (Signed)
There was no change, but the thyroid test was markedly off, and it appeared that she was not taking and probably ran out of the med recently  I ref[facility] the [facility] dose nov 15 [facility]

## 2017-09-22 NOTE — Telephone Encounter (Signed)
Wanted to see if it was a medication change during her last OV before I contacted the patient. Please advise.

## 2017-09-22 NOTE — Telephone Encounter (Signed)
This was d/w pt at last phone note I believe  I indicated I did not feel comfortable with FMLA for stress though I could refer her to counseling, and the last note indicated she thought this was not needed  If she needs FMLA for her husband, this normally comes from the husbands physician

## 2017-09-23 NOTE — Telephone Encounter (Signed)
Forms have been completed, Waiting for MD to sign.

## 2017-09-23 NOTE — Telephone Encounter (Signed)
OK, that would be fine, thanks

## 2017-09-23 NOTE — Telephone Encounter (Signed)
I believe you are referring to another patient we have discussed with every similar circumstance.  But this is my fault, I sent this at the end of the day, I should of given more detailed and did not read your office notes. After going back and reading the office notes on on 11/15 I see this patient had a letter wrote for her mother passing on Nov 5 to be out of work from Nov 15 with a return to work on Nov 26 for grievance.   Are you ok with me filling out the FMLA forms for this.  Sorry for the confusing, Thank you.

## 2017-09-28 DIAGNOSIS — Z0279 Encounter for issue of other medical certificate: Secondary | ICD-10-CM

## 2017-09-28 NOTE — Telephone Encounter (Signed)
Forms have been signed by MD, Faxed to [employer] [phone], copy sent to scan, original mailed to patient, &charged for.

## 2017-10-29 ENCOUNTER — Other Ambulatory Visit (INDEPENDENT_AMBULATORY_CARE_PROVIDER_SITE_OTHER): Payer: 59

## 2017-10-29 ENCOUNTER — Encounter: Payer: Self-pay | Admitting: Internal Medicine

## 2017-10-29 ENCOUNTER — Ambulatory Visit (INDEPENDENT_AMBULATORY_CARE_PROVIDER_SITE_OTHER): Payer: 59 | Admitting: Internal Medicine

## 2017-10-29 VITALS — BP 122/86 | HR 81 | Temp 98.2°F | Ht 65.0 in | Wt 347.0 lb

## 2017-10-29 DIAGNOSIS — M1990 Unspecified osteoarthritis, unspecified site: Secondary | ICD-10-CM

## 2017-10-29 DIAGNOSIS — M25562 Pain in left knee: Secondary | ICD-10-CM | POA: Diagnosis not present

## 2017-10-29 DIAGNOSIS — E039 Hypothyroidism, unspecified: Secondary | ICD-10-CM | POA: Diagnosis not present

## 2017-10-29 DIAGNOSIS — I10 Essential (primary) hypertension: Secondary | ICD-10-CM

## 2017-10-29 DIAGNOSIS — M25561 Pain in right knee: Secondary | ICD-10-CM

## 2017-10-29 MED ORDER — TRAMADOL HCL 50 MG PO TABS: ORAL_TABLET | ORAL | 1 refills | Status: DC

## 2017-10-29 MED ORDER — DICLOFENAC SODIUM 1 % TD GEL: 2.0000 g | Freq: Four times a day (QID) | TRANSDERMAL | 3 refills | Status: DC

## 2017-10-29 MED FILL — traMADol HCL 50 MG TABS: 50 | 20 days supply | Qty: 60 | Fill #0

## 2017-10-29 MED FILL — DICLOFENAC SODIUM 1% GEL: 1 | 12 days supply | Qty: 100 | Fill #0
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
## 2017-10-29 NOTE — Patient Instructions (Addendum)
Please take all new medication as prescribed - the voltaren gel for hand and knee pain, as well as tramadol as needed for pain breakthrough  Please continue all other medications as before, and refills have been done if requested.  Please have the pharmacy call with any other refills you may need.  Please keep your appointments with your specialists as you may have planned  Please go to the LAB in the Basement (turn left off the elevator) for the tests to be done today  You will be contacted by phone if any changes need to be made immediately.  Otherwise, you will receive a letter about your results with an explanation, but please check with MyChart first.  Please remember to sign up for MyChart if you have not done so, as this will be important to you in the future with finding out test results, communicating by private email, and scheduling acute appointments online when needed.

## 2017-10-30 ENCOUNTER — Telehealth: Payer: Self-pay

## 2017-10-30 ENCOUNTER — Other Ambulatory Visit: Payer: Self-pay | Admitting: Internal Medicine

## 2017-10-30 LAB — TSH: TSH: 11.04 u[IU]/mL — ABNORMAL HIGH (ref 0.35–4.50)

## 2017-10-30 LAB — T4, FREE: FREE T4: 0.71 ng/dL (ref 0.60–1.60)

## 2017-10-30 MED ORDER — LEVOTHYROXINE SODIUM 200 MCG PO TABS: 200.0000 ug | ORAL_TABLET | Freq: Every day | ORAL | 3 refills | Status: DC

## 2017-10-30 MED FILL — LEVOTHYROXINE 200 MCG TAB: 200 | 90 days supply | Qty: 90 | Fill #0

## 2017-10-30 NOTE — Telephone Encounter (Signed)
-----   Message from Corwin LevinsJames W John, MD sent at 10/30/2017 12:43 PM EST ----- Left message on MyChart, pt to cont same tx except  The test results show that your current treatment is OK, except although the thyroid testing is better, it is still not normalized.  Please take the thryoid medication at a higher dose of 200 mcg per day.  I will send prescription and you should hear from the office as well.Lendell Caprice.    Rossana Molchan to please inform pt, I will do rx

## 2017-10-30 NOTE — Telephone Encounter (Signed)
Pt has been informed and expressed understanding.  

## 2017-11-01 DIAGNOSIS — M1990 Unspecified osteoarthritis, unspecified site: Secondary | ICD-10-CM | POA: Insufficient documentation

## 2017-11-01 NOTE — Assessment & Plan Note (Signed)
BP Readings from Last 3 Encounters:  10/29/17 122/86  09/17/17 124/88  02/18/17 108/68  stable overall by history and exam, recent data reviewed with pt, and pt to continue medical treatment as before,  to f/u any worsening symptoms or concerns

## 2017-11-01 NOTE — Assessment & Plan Note (Addendum)
stable overall by history and exam, 09/17/2017 recent data reviewed with pt, and pt to continue medical treatment as before,  to f/u any worsening symptoms or concerns,

## 2017-11-01 NOTE — Assessment & Plan Note (Signed)
Worse symptomatically to hands and knees, for volt gel prn, also tramadol prn, refer sports medicine
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
## 2017-11-23 ENCOUNTER — Ambulatory Visit: Payer: Self-pay

## 2017-11-23 ENCOUNTER — Encounter: Payer: Self-pay | Admitting: Family Medicine

## 2017-11-23 ENCOUNTER — Ambulatory Visit (INDEPENDENT_AMBULATORY_CARE_PROVIDER_SITE_OTHER)
Admission: RE | Admit: 2017-11-23 | Discharge: 2017-11-23 | Disposition: A | Discharge status: Home / Self Care | Payer: 59 | Source: Ambulatory Visit | Attending: Family Medicine | Admitting: Family Medicine

## 2017-11-23 ENCOUNTER — Ambulatory Visit: Payer: 59 | Admitting: Family Medicine

## 2017-11-23 ENCOUNTER — Other Ambulatory Visit: Payer: Self-pay

## 2017-11-23 VITALS — BP 120/78 | HR 77 | Ht 65.0 in | Wt 355.0 lb

## 2017-11-23 DIAGNOSIS — M25562 Pain in left knee: Secondary | ICD-10-CM

## 2017-11-23 DIAGNOSIS — M25561 Pain in right knee: Secondary | ICD-10-CM

## 2017-11-23 DIAGNOSIS — R635 Abnormal weight gain: Secondary | ICD-10-CM

## 2017-11-23 DIAGNOSIS — M170 Bilateral primary osteoarthritis of knee: Secondary | ICD-10-CM | POA: Diagnosis not present

## 2017-11-23 DIAGNOSIS — G8929 Other chronic pain: Secondary | ICD-10-CM

## 2017-11-23 MED ORDER — DICLOFENAC SODIUM 2 % TD SOLN: 2.0000 g | Freq: Two times a day (BID) | TRANSDERMAL | 3 refills | Status: DC

## 2017-11-23 NOTE — Assessment & Plan Note (Signed)
Patient is morbidly obese.  Likely contributing to some of the pain.  Likely does have moderate to severe osteoarthritic changes.  X-rays are pending.  Topical anti-inflammatories given, icing regimen, we discussed which activities of doing which wants to avoid, we discussed proper shoes and over-the-counter orthotics to help alignment.  Due to patient's large thigh to calf ratio and instability of the knee custom bracing ordered as well for the left knee initially but may need it bilaterally.  We will try this interventions and encouraged weight loss.  Follow-up again in 4 weeks

## 2017-11-23 NOTE — Patient Instructions (Signed)
Good to see you  Ice 20 minutes 2 times daily. Usually after activity and before bed. Exercises 3 times a week.  pennsaid pinkie amount topically 2 times daily as needed.  Over the counter get  Vitamin D 2000 IU dialy  Turmeric 500mg  daily  Tart cherry extract at night any dose.  We will try to get you a custom brace We will also get you in with Weight management.  [employer] "total support" online would be great   See me again in 4 weeks

## 2017-11-30 ENCOUNTER — Telehealth: Payer: Self-pay | Admitting: Internal Medicine

## 2017-11-30 NOTE — Telephone Encounter (Signed)
Copied from CRM [id number]. Topic: Quick Communication - See Telephone Encounter >> Nov 30, 2017 10:02 AM Jolayne Hainesaylor, Brittany L wrote: CRM for notification. See Telephone encounter for:   11/30/17.  Pt is requesting her xrays results from 1/21 of her knee.  Call back is [phone]

## 2017-11-30 NOTE — Telephone Encounter (Signed)
Spoke with patient in regards to xray results. 

## 2017-12-09 ENCOUNTER — Ambulatory Visit (INDEPENDENT_AMBULATORY_CARE_PROVIDER_SITE_OTHER): Payer: 59

## 2017-12-09 ENCOUNTER — Ambulatory Visit (INDEPENDENT_AMBULATORY_CARE_PROVIDER_SITE_OTHER): Payer: 59 | Admitting: Physician Assistant

## 2017-12-09 ENCOUNTER — Encounter (INDEPENDENT_AMBULATORY_CARE_PROVIDER_SITE_OTHER): Payer: Self-pay | Admitting: Physician Assistant

## 2017-12-09 DIAGNOSIS — M5441 Lumbago with sciatica, right side: Secondary | ICD-10-CM

## 2017-12-09 DIAGNOSIS — M5442 Lumbago with sciatica, left side: Secondary | ICD-10-CM | POA: Diagnosis not present

## 2017-12-09 MED ORDER — METHYLPREDNISOLONE 4 MG PO TABS: ORAL_TABLET | ORAL | 0 refills | Status: DC

## 2017-12-09 MED ORDER — DIAZEPAM 2 MG PO TABS: ORAL_TABLET | ORAL | 0 refills | Status: DC

## 2017-12-09 MED ORDER — CYCLOBENZAPRINE HCL 10 MG PO TABS: ORAL_TABLET | ORAL | 0 refills | Status: DC

## 2017-12-09 MED ORDER — NAPROXEN 500 MG PO TABS: 500.0000 mg | ORAL_TABLET | Freq: Two times a day (BID) | ORAL | 1 refills | Status: DC

## 2017-12-09 MED FILL — CYCLOBENZAPRINE 10 MG TAB: 10 | 15 days supply | Qty: 30 | Fill #0

## 2017-12-09 MED FILL — METHYLPREDNISOLONE 4 MG TAB: 4 | 6 days supply | Qty: 21 | Fill #0

## 2017-12-09 MED FILL — NAPROXEN 500 MG TABLET: 500 | 30 days supply | Qty: 60 | Fill #0

## 2017-12-09 NOTE — Addendum Note (Signed)
Addended by: Rogers SeedsYEATTS, Euretha Najarro M on: 12/09/2017 05:22 PM   Modules accepted: Orders
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
## 2017-12-14 ENCOUNTER — Other Ambulatory Visit: Payer: Self-pay | Admitting: Internal Medicine

## 2017-12-14 ENCOUNTER — Other Ambulatory Visit (INDEPENDENT_AMBULATORY_CARE_PROVIDER_SITE_OTHER): Payer: Self-pay | Admitting: Orthopaedic Surgery

## 2017-12-14 DIAGNOSIS — L309 Dermatitis, unspecified: Secondary | ICD-10-CM

## 2017-12-14 MED FILL — FLUTICASONE PROP 0.05% CRM: 0.05 | 15 days supply | Qty: 30 | Fill #0

## 2017-12-14 MED FILL — HYDROCHLOROTHIAZIDE 25 MG T: 25 | 30 days supply | Qty: 30 | Fill #0

## 2017-12-14 MED FILL — SUMATRIPTAN SUCC 100 MG TAB: 100 | 30 days supply | Qty: 8 | Fill #0

## 2017-12-14 MED FILL — LOSARTAN POTASSIUM 100 MG T: 100 | 30 days supply | Qty: 30 | Fill #0

## 2017-12-14 MED FILL — LIDOCAINE PATCH 5%: 5 | 30 days supply | Qty: 30 | Fill #0
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
## 2017-12-21 ENCOUNTER — Encounter: Payer: Self-pay | Admitting: Family

## 2017-12-21 ENCOUNTER — Encounter: Payer: Self-pay | Admitting: Family Medicine

## 2017-12-21 ENCOUNTER — Ambulatory Visit: Payer: 59 | Admitting: Family

## 2017-12-21 ENCOUNTER — Ambulatory Visit (INDEPENDENT_AMBULATORY_CARE_PROVIDER_SITE_OTHER): Payer: 59 | Admitting: Family Medicine

## 2017-12-21 VITALS — BP 130/90 | HR 78 | Temp 98.3°F | Ht 65.0 in | Wt 357.0 lb

## 2017-12-21 VITALS — BP 130/90 | HR 78 | Ht 65.0 in | Wt 357.0 lb

## 2017-12-21 DIAGNOSIS — J3089 Other allergic rhinitis: Secondary | ICD-10-CM | POA: Diagnosis not present

## 2017-12-21 DIAGNOSIS — M170 Bilateral primary osteoarthritis of knee: Secondary | ICD-10-CM | POA: Diagnosis not present

## 2017-12-21 DIAGNOSIS — M25561 Pain in right knee: Secondary | ICD-10-CM | POA: Diagnosis not present

## 2017-12-21 DIAGNOSIS — G4733 Obstructive sleep apnea (adult) (pediatric): Secondary | ICD-10-CM

## 2017-12-21 DIAGNOSIS — J019 Acute sinusitis, unspecified: Secondary | ICD-10-CM | POA: Diagnosis not present

## 2017-12-21 DIAGNOSIS — M25562 Pain in left knee: Secondary | ICD-10-CM

## 2017-12-21 DIAGNOSIS — G8929 Other chronic pain: Secondary | ICD-10-CM | POA: Diagnosis not present

## 2017-12-21 MED ORDER — FLUTICASONE PROPIONATE 50 MCG/ACT NA SUSP: 2.0000 | Freq: Every day | NASAL | 6 refills | Status: DC

## 2017-12-21 MED ORDER — AMOXICILLIN-POT CLAVULANATE 875-125 MG PO TABS: 1.0000 | ORAL_TABLET | Freq: Two times a day (BID) | ORAL | 0 refills | Status: DC

## 2017-12-21 MED FILL — AMOXICILLIN-POT CLAVULANATE: 875-125 | 10 days supply | Qty: 20 | Fill #0

## 2017-12-21 MED FILL — FLUTICASONE PROP 50 MCG SPR: 50 | 30 days supply | Qty: 16 | Fill #0

## 2017-12-21 NOTE — Patient Instructions (Signed)
Good to see you  Ice 20 minutes 2 times daily. Usually after activity and before bed. Exercises 3 times a week.  pennsaid pinkie amount topically 2 times daily as needed.   Keep up with the vitamins [employer] "total support" online would be great  Good shoes with rigid bottom.  Dierdre HarnessKeen, Dansko, Merrell or New balance greater then 700 We will check on the brace PT will be calling you  See me again in 4-6 weeks!
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
## 2017-12-21 NOTE — Assessment & Plan Note (Signed)
Stable overall.  Discussed icing regimen and home exercises.  Discussed which activity to doing which wants to avoid.  Patient will increase activity as tolerated.  Follow-up with me again in 4-6 weeks

## 2017-12-23 ENCOUNTER — Ambulatory Visit (INDEPENDENT_AMBULATORY_CARE_PROVIDER_SITE_OTHER): Payer: 59 | Admitting: Physician Assistant

## 2018-01-07 ENCOUNTER — Ambulatory Visit: Payer: Self-pay | Admitting: Adult Health

## 2018-01-07 ENCOUNTER — Other Ambulatory Visit: Payer: Self-pay

## 2018-01-07 ENCOUNTER — Encounter (INDEPENDENT_AMBULATORY_CARE_PROVIDER_SITE_OTHER): Payer: 59

## 2018-01-11 ENCOUNTER — Encounter (HOSPITAL_COMMUNITY): Payer: Self-pay | Admitting: *Deleted

## 2018-01-11 ENCOUNTER — Other Ambulatory Visit: Payer: Self-pay

## 2018-01-12 ENCOUNTER — Ambulatory Visit (HOSPITAL_COMMUNITY): Payer: 59 | Admitting: Anesthesiology

## 2018-01-12 ENCOUNTER — Encounter (HOSPITAL_COMMUNITY): Payer: Self-pay | Admitting: *Deleted

## 2018-01-12 ENCOUNTER — Ambulatory Visit (HOSPITAL_COMMUNITY)
Admission: AD | Admit: 2018-01-12 | Discharge: 2018-01-12 | Disposition: A | Discharge status: Home / Self Care | Payer: 59 | Source: Ambulatory Visit | Attending: Physician Assistant | Admitting: Physician Assistant

## 2018-01-12 ENCOUNTER — Encounter (HOSPITAL_COMMUNITY)
Admission: AD | Disposition: A | Discharge status: Home / Self Care | Payer: Self-pay | Source: Ambulatory Visit | Attending: Physician Assistant

## 2018-01-12 ENCOUNTER — Ambulatory Visit (HOSPITAL_COMMUNITY)
Admission: RE | Admit: 2018-01-12 | Discharge: 2018-01-12 | Disposition: A | Discharge status: Home / Self Care | Payer: 59 | Source: Ambulatory Visit | Attending: Physician Assistant | Admitting: Physician Assistant

## 2018-01-12 DIAGNOSIS — M5441 Lumbago with sciatica, right side: Secondary | ICD-10-CM | POA: Diagnosis not present

## 2018-01-12 DIAGNOSIS — E039 Hypothyroidism, unspecified: Secondary | ICD-10-CM | POA: Diagnosis not present

## 2018-01-12 DIAGNOSIS — Z79899 Other long term (current) drug therapy: Secondary | ICD-10-CM | POA: Diagnosis not present

## 2018-01-12 DIAGNOSIS — M48061 Spinal stenosis, lumbar region without neurogenic claudication: Secondary | ICD-10-CM | POA: Diagnosis not present

## 2018-01-12 DIAGNOSIS — G5602 Carpal tunnel syndrome, left upper limb: Secondary | ICD-10-CM | POA: Diagnosis not present

## 2018-01-12 DIAGNOSIS — M5442 Lumbago with sciatica, left side: Principal | ICD-10-CM

## 2018-01-12 DIAGNOSIS — I10 Essential (primary) hypertension: Secondary | ICD-10-CM | POA: Insufficient documentation

## 2018-01-12 DIAGNOSIS — M5136 Other intervertebral disc degeneration, lumbar region: Secondary | ICD-10-CM | POA: Diagnosis not present

## 2018-01-12 DIAGNOSIS — R296 Repeated falls: Secondary | ICD-10-CM | POA: Diagnosis not present

## 2018-01-12 HISTORY — PX: RADIOLOGY WITH ANESTHESIA: SHX6223

## 2018-01-12 LAB — BASIC METABOLIC PANEL
ANION GAP: 9 (ref 5–15)
BUN: 15 mg/dL (ref 6–20)
CHLORIDE: 103 mmol/L (ref 101–111)
CO2: 25 mmol/L (ref 22–32)
Calcium: 8.9 mg/dL (ref 8.9–10.3)
Creatinine, Ser: 0.93 mg/dL (ref 0.44–1.00)
GFR calc Af Amer: >60 mL/min (ref >60)
GLUCOSE: 96 mg/dL (ref 65–99)
POTASSIUM: 4 mmol/L (ref 3.5–5.1)
Sodium: 137 mmol/L (ref 135–145)

## 2018-01-12 LAB — CBC
HEMATOCRIT: 36.3 % (ref 36.0–46.0)
HEMOGLOBIN: 11.6 g/dL — AB (ref 12.0–15.0)
MCH: 26.8 pg (ref 26.0–34.0)
MCHC: 32 g/dL (ref 30.0–36.0)
MCV: 83.8 fL (ref 78.0–100.0)
Platelets: 267 10*3/uL (ref 150–400)
RBC: 4.33 MIL/uL (ref 3.87–5.11)
RDW: 18.2 % — AB (ref 11.5–15.5)
WBC: 8.6 10*3/uL (ref 4.0–10.5)

## 2018-01-12 LAB — POCT PREGNANCY, URINE: Preg Test, Ur: NEGATIVE

## 2018-01-12 SURGERY — MRI WITH ANESTHESIA
Anesthesia: General

## 2018-01-12 MED ORDER — LACTATED RINGERS IV SOLN: INTRAVENOUS | Status: DC

## 2018-01-12 NOTE — Transfer of Care (Signed)
Immediate Anesthesia Transfer of Care Note  Patient: Vanessa [name]  Procedure(s) Performed: MRI OF LUMBAR SPINE WITHOUT CONTRAST (N/A )  Patient Location: PACU  Anesthesia Type:General  Level of Consciousness: awake, alert  and oriented  Airway & Oxygen Therapy: Patient Spontanous Breathing and Patient connected to nasal cannula oxygen  Post-op Assessment: Report given to RN, Post -op Vital signs reviewed and stable and Patient moving all extremities  Post vital signs: Reviewed and stable  Last Vitals:  Vitals:   01/12/18 0818 01/12/18 1216  BP: 123/76 (!) 116/58  Pulse: 80 77  Resp: (!) 77 18  Temp: 36.8 C   SpO2: 100% 96%    Last Pain:  Vitals:   01/12/18 0857  TempSrc:   PainSc: 7       Patients Stated Pain Goal: 8 (01/12/18 0857)  Complications: No apparent anesthesia complications

## 2018-01-12 NOTE — Anesthesia Preprocedure Evaluation (Signed)
Anesthesia Evaluation  Patient identified by MRN, date of birth, ID band Patient awake    Reviewed: Allergy & Precautions, NPO status , Patient's Chart, lab work & pertinent test results  Airway Mallampati: II  TM Distance: >3 FB     Dental   Pulmonary asthma ,    breath sounds clear to auscultation       Cardiovascular hypertension, + Valvular Problems/Murmurs  Rhythm:Regular Rate:Normal     Neuro/Psych  Headaches,  Neuromuscular disease    GI/Hepatic negative GI ROS, Neg liver ROS,   Endo/Other  Hypothyroidism   Renal/GU negative Renal ROS     Musculoskeletal   Abdominal   Peds  Hematology   Anesthesia Other Findings   Reproductive/Obstetrics                             Anesthesia Physical Anesthesia Plan  ASA: II  Anesthesia Plan: General   Post-op Pain Management:    Induction: Intravenous  PONV Risk Score and Plan: Treatment may vary due to age or medical condition  Airway Management Planned: Oral ETT  Additional Equipment:   Intra-op Plan:   Post-operative Plan:   Informed Consent: I have reviewed the patients History and Physical, chart, labs and discussed the procedure including the risks, benefits and alternatives for the proposed anesthesia with the patient or authorized representative who has indicated his/her understanding and acceptance.   Dental advisory given  Plan Discussed with: CRNA, Anesthesiologist and Surgeon  Anesthesia Plan Comments:         Anesthesia Quick Evaluation

## 2018-01-12 NOTE — Anesthesia Postprocedure Evaluation (Signed)
Anesthesia Post Note  Patient: Benay PillowStacey C Overfelt  Procedure(s) Performed: MRI OF LUMBAR SPINE WITHOUT CONTRAST (N/A )     Patient location during evaluation: PACU Anesthesia Type: General Level of consciousness: awake Pain management: pain level controlled Vital Signs Assessment: post-procedure vital signs reviewed and stable Respiratory status: spontaneous breathing Cardiovascular status: stable Anesthetic complications: no    Last Vitals:  Vitals:   01/12/18 1230 01/12/18 1245  BP:  (!) 103/51  Pulse: 77 71  Resp: 18 18  Temp:    SpO2: 98% 98%    Last Pain:  Vitals:   01/12/18 0857  TempSrc:   PainSc: 7                  Allan Bacigalupi

## 2018-01-13 ENCOUNTER — Encounter (HOSPITAL_COMMUNITY): Payer: Self-pay | Admitting: Radiology

## 2018-01-13 MED FILL — Phenylephrine-NaCl Pref Syringe 0.2 MG/5ML-0.9% (40 MCG/ML): INTRAVENOUS | Qty: 5 | Status: AC

## 2018-01-13 MED FILL — Ondansetron HCl Inj 4 MG/2ML (2 MG/ML): INTRAMUSCULAR | Qty: 2 | Status: AC

## 2018-01-13 MED FILL — Lactated Ringer's Solution: INTRAVENOUS | Qty: 1000 | Status: AC

## 2018-01-13 MED FILL — Rocuronium Bromide IV Soln 50 MG/5ML (10 MG/ML): INTRAVENOUS | Qty: 5 | Status: AC

## 2018-01-13 MED FILL — Propofol IV Emul 200 MG/20ML (10 MG/ML): INTRAVENOUS | Qty: 20 | Status: AC

## 2018-01-13 MED FILL — Midazolam HCl Inj 2 MG/2ML (Base Equivalent): INTRAMUSCULAR | Qty: 2 | Status: AC

## 2018-01-13 MED FILL — Dexamethasone Sodium Phosphate Inj 10 MG/ML: INTRAMUSCULAR | Qty: 1 | Status: AC

## 2018-01-13 MED FILL — Lidocaine HCl Local Soln Prefilled Syringe 100 MG/5ML (2%): INTRAMUSCULAR | Qty: 5 | Status: AC

## 2018-01-13 MED FILL — Succinylcholine Chloride Sol Pref Syr 200 MG/10ML (20 MG/ML): INTRAVENOUS | Qty: 10 | Status: AC

## 2018-01-18 ENCOUNTER — Ambulatory Visit (INDEPENDENT_AMBULATORY_CARE_PROVIDER_SITE_OTHER): Payer: 59 | Admitting: Adult Health

## 2018-01-18 ENCOUNTER — Encounter: Payer: Self-pay | Admitting: Adult Health

## 2018-01-18 DIAGNOSIS — R400 Somnolence: Secondary | ICD-10-CM

## 2018-01-18 NOTE — Assessment & Plan Note (Signed)
Suspect OSA  Daytime sleepiness, snoring , obesity, low energy , restless sleep   Plan  . Patient Instructions  Set up for home sleep study  Follow up Dr. Craige CottaSood  In 2 months and As needed

## 2018-01-18 NOTE — Progress Notes (Signed)
Reviewed and agree with assessment/plan.   Arbor Leer, MD [facility] 10/29/2016, 12:24 PM Pager:  [phone]  

## 2018-01-18 NOTE — Assessment & Plan Note (Signed)
Wt loss  

## 2018-01-18 NOTE — Patient Instructions (Signed)
Set up for home sleep study  Follow up Dr. Craige CottaSood  In 2 months and As needed
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
## 2018-01-25 ENCOUNTER — Ambulatory Visit (INDEPENDENT_AMBULATORY_CARE_PROVIDER_SITE_OTHER): Payer: 59 | Admitting: Physician Assistant

## 2018-01-25 ENCOUNTER — Encounter (INDEPENDENT_AMBULATORY_CARE_PROVIDER_SITE_OTHER): Payer: Self-pay | Admitting: Physician Assistant

## 2018-01-25 ENCOUNTER — Ambulatory Visit (INDEPENDENT_AMBULATORY_CARE_PROVIDER_SITE_OTHER): Payer: 59 | Admitting: Family Medicine

## 2018-01-25 DIAGNOSIS — M5442 Lumbago with sciatica, left side: Secondary | ICD-10-CM | POA: Diagnosis not present

## 2018-01-25 DIAGNOSIS — M5441 Lumbago with sciatica, right side: Secondary | ICD-10-CM

## 2018-01-25 MED ORDER — CYCLOBENZAPRINE HCL 10 MG PO TABS: 10.0000 mg | ORAL_TABLET | Freq: Three times a day (TID) | ORAL | 0 refills | Status: DC | PRN

## 2018-01-25 NOTE — Progress Notes (Signed)
HPI: [name] returns today follow-up of her lumbar spine status post MRI.  She continues to have left greater than right leg pain and numbness does not radiate into her feet at this point in time.  She is having back pain that awakens her.  No bowel bladder dysfunction. MRI is reviewed with the patient today using a spinal model for visual demonstration.  L4-5 moderate bilateral facet hypertrophy with mild bilateral foraminal stenosis.  L5-S1 advanced bilateral facet arthrosis with a 3 mm anterior listhesis which results in mild to moderate bilateral L5 foraminal stenosis.  Review of systems: See HPI otherwise negative.  Physical exam: General well-developed well-nourished female no acute distress mood and affect appropriate.  Psych alert and oriented x3.  Ambulates without any assistive devices but with an antalgic gait.  Impression: Low back pain with radicular symptoms left greater than right  Plan: Discussed with patient sending her to have epidural/facet injections she defers.  Therefore we will send her to physical therapy for back and core strengthening.  Did call in some Flexeril for her.  Discussed weight loss.  Questions encouraged and answered at length.  She will follow with us on as-needed basis pain persist or becomes worse.  If she decides that she would like to undergo the epidural/facet injections she will call our office and let us know.

## 2018-01-28 ENCOUNTER — Telehealth (INDEPENDENT_AMBULATORY_CARE_PROVIDER_SITE_OTHER): Payer: Self-pay | Admitting: Orthopaedic Surgery

## 2018-01-28 NOTE — Telephone Encounter (Signed)
This is not for me, if she needs recs sent she'll have to fill out a different auth.

## 2018-01-28 NOTE — Telephone Encounter (Signed)
Patient is requesting documentation about her referral to physical therapy be sent to [facility]. She said she starts on Tuesday 4/2 at 3:45. She said we have all the fax numbers for them. Not sure if this is for [facility].

## 2018-01-29 NOTE — Telephone Encounter (Signed)
Faxed last OV note to [facility]

## 2018-02-02 ENCOUNTER — Ambulatory Visit: Payer: 59 | Admitting: Physical Therapy

## 2018-02-03 ENCOUNTER — Other Ambulatory Visit: Payer: Self-pay

## 2018-02-03 ENCOUNTER — Encounter: Payer: Self-pay | Admitting: Physical Therapy

## 2018-02-03 ENCOUNTER — Ambulatory Visit: Payer: 59 | Attending: Physician Assistant | Admitting: Physical Therapy

## 2018-02-03 DIAGNOSIS — G8929 Other chronic pain: Secondary | ICD-10-CM | POA: Insufficient documentation

## 2018-02-03 DIAGNOSIS — M5442 Lumbago with sciatica, left side: Secondary | ICD-10-CM | POA: Insufficient documentation

## 2018-02-03 DIAGNOSIS — R262 Difficulty in walking, not elsewhere classified: Secondary | ICD-10-CM | POA: Diagnosis not present

## 2018-02-03 DIAGNOSIS — M25552 Pain in left hip: Secondary | ICD-10-CM | POA: Diagnosis not present

## 2018-02-03 DIAGNOSIS — M6281 Muscle weakness (generalized): Secondary | ICD-10-CM | POA: Diagnosis not present

## 2018-02-03 DIAGNOSIS — M5441 Lumbago with sciatica, right side: Secondary | ICD-10-CM | POA: Diagnosis not present
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
## 2018-02-04 ENCOUNTER — Ambulatory Visit: Payer: 59 | Admitting: Allergy

## 2018-02-04 ENCOUNTER — Other Ambulatory Visit: Payer: Self-pay | Admitting: Internal Medicine

## 2018-02-04 MED FILL — HYDROCHLOROTHIAZIDE 25 MG T: 25 | 30 days supply | Qty: 30 | Fill #0

## 2018-02-04 MED FILL — LOSARTAN POTASSIUM 100 MG T: 100 | 30 days supply | Qty: 30 | Fill #0

## 2018-02-04 MED FILL — LIDOCAINE PATCH 5%: 5 | 30 days supply | Qty: 30 | Fill #1

## 2018-02-04 MED FILL — LEVOTHYROXINE 200 MCG TAB: 200 | 90 days supply | Qty: 90 | Fill #1

## 2018-02-04 MED FILL — CYCLOBENZAPRINE 10 MG TAB: 10 | 10 days supply | Qty: 30 | Fill #0

## 2018-02-09 ENCOUNTER — Ambulatory Visit: Payer: 59 | Admitting: Physical Therapy

## 2018-02-09 ENCOUNTER — Encounter: Payer: Self-pay | Admitting: Physical Therapy

## 2018-02-09 DIAGNOSIS — M5442 Lumbago with sciatica, left side: Principal | ICD-10-CM

## 2018-02-09 DIAGNOSIS — G8929 Other chronic pain: Secondary | ICD-10-CM | POA: Diagnosis not present

## 2018-02-09 DIAGNOSIS — R262 Difficulty in walking, not elsewhere classified: Secondary | ICD-10-CM

## 2018-02-09 DIAGNOSIS — M5441 Lumbago with sciatica, right side: Secondary | ICD-10-CM | POA: Diagnosis not present

## 2018-02-09 DIAGNOSIS — M6281 Muscle weakness (generalized): Secondary | ICD-10-CM | POA: Diagnosis not present

## 2018-02-09 DIAGNOSIS — M25552 Pain in left hip: Secondary | ICD-10-CM | POA: Diagnosis not present
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
## 2018-02-09 NOTE — Patient Instructions (Signed)
Abduction: Clam (Eccentric) - Side-Lying   GREEN BAND AROUND KNEES  Lie on side with knees bent. Lift top knee, keeping feet together. Keep trunk steady. Slowly lower for 3-5 seconds. __10-20_ reps per set, __2_ sets per day, _7__ days per week.

## 2018-02-10 ENCOUNTER — Encounter: Payer: Self-pay | Admitting: Internal Medicine

## 2018-02-10 ENCOUNTER — Ambulatory Visit (INDEPENDENT_AMBULATORY_CARE_PROVIDER_SITE_OTHER): Payer: 59 | Admitting: Internal Medicine

## 2018-02-10 ENCOUNTER — Ambulatory Visit: Payer: 59 | Admitting: Physical Therapy

## 2018-02-10 VITALS — BP 132/88 | HR 100 | Temp 98.4°F | Ht 65.0 in | Wt 350.0 lb

## 2018-02-10 DIAGNOSIS — R059 Cough, unspecified: Secondary | ICD-10-CM

## 2018-02-10 DIAGNOSIS — I10 Essential (primary) hypertension: Secondary | ICD-10-CM | POA: Diagnosis not present

## 2018-02-10 DIAGNOSIS — R062 Wheezing: Secondary | ICD-10-CM

## 2018-02-10 DIAGNOSIS — R05 Cough: Secondary | ICD-10-CM

## 2018-02-10 MED ORDER — PREDNISONE 10 MG PO TABS: ORAL_TABLET | ORAL | 0 refills | Status: DC

## 2018-02-10 MED ORDER — HYDROCODONE-HOMATROPINE 5-1.5 MG/5ML PO SYRP: 5.0000 mL | ORAL_SOLUTION | Freq: Four times a day (QID) | ORAL | 0 refills | Status: AC | PRN

## 2018-02-10 MED ORDER — AZITHROMYCIN 250 MG PO TABS: ORAL_TABLET | ORAL | 1 refills | Status: DC

## 2018-02-10 MED FILL — predniSONE 10 MG TABS: 10 | 9 days supply | Qty: 18 | Fill #0

## 2018-02-10 MED FILL — HYDROCODONE-HOMATROPINE SYR: 5-1.5 | 9 days supply | Qty: 180 | Fill #0

## 2018-02-10 MED FILL — AZITHROMYCIN 250 MG TABLET: 250 | 5 days supply | Qty: 6 | Fill #0

## 2018-02-10 NOTE — Patient Instructions (Addendum)
Please take all new medication as prescribed  - the antibiotic, cough medicine, and prednisone  Please continue all other medications as before, including the inhaler  Please have the pharmacy call with any other refills you may need.  Please keep your appointments with your specialists as you may have planned     
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
## 2018-02-13 ENCOUNTER — Encounter: Payer: Self-pay | Admitting: Internal Medicine

## 2018-02-13 NOTE — Assessment & Plan Note (Signed)
Mild to mod, likely bronchospastic, for predpac asd,  to f/u any worsening symptoms or concerns

## 2018-02-13 NOTE — Assessment & Plan Note (Signed)
BP Readings from Last 3 Encounters:  02/10/18 132/88  01/18/18 126/82  01/12/18 (!) 103/51  stable overall by history and exam, recent data reviewed with pt, and pt to continue medical treatment as before,  to f/u any worsening symptoms or concerns

## 2018-02-13 NOTE — Assessment & Plan Note (Signed)
Mild to mod, likely secondary,  for cough med prn,  to f/u any worsening symptoms or concerns

## 2018-02-16 ENCOUNTER — Ambulatory Visit: Payer: 59 | Admitting: Physical Therapy

## 2018-02-16 DIAGNOSIS — M25552 Pain in left hip: Secondary | ICD-10-CM

## 2018-02-16 DIAGNOSIS — M5441 Lumbago with sciatica, right side: Principal | ICD-10-CM

## 2018-02-16 DIAGNOSIS — M6281 Muscle weakness (generalized): Secondary | ICD-10-CM

## 2018-02-16 DIAGNOSIS — M5442 Lumbago with sciatica, left side: Secondary | ICD-10-CM | POA: Diagnosis not present

## 2018-02-16 DIAGNOSIS — R262 Difficulty in walking, not elsewhere classified: Secondary | ICD-10-CM

## 2018-02-16 DIAGNOSIS — G8929 Other chronic pain: Secondary | ICD-10-CM | POA: Diagnosis not present

## 2018-02-17 ENCOUNTER — Encounter: Payer: Self-pay | Admitting: Physical Therapy

## 2018-02-17 DIAGNOSIS — G4733 Obstructive sleep apnea (adult) (pediatric): Secondary | ICD-10-CM | POA: Diagnosis not present
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
## 2018-02-18 ENCOUNTER — Telehealth: Payer: Self-pay | Admitting: Internal Medicine

## 2018-02-18 ENCOUNTER — Ambulatory Visit: Payer: 59 | Admitting: Physical Therapy

## 2018-02-18 NOTE — Telephone Encounter (Signed)
Pt has been informed and expressed understanding.  

## 2018-02-18 NOTE — Telephone Encounter (Signed)
Patient is requesting a refill on  HYDROcodone-homatropine (HYCODAN) 5-1.5 MG/5ML syrup   She states she still has a cough and it helps her sleep at night.  Patient has been informed she may have to make a appointment.  Please advise, Thank you.

## 2018-02-18 NOTE — Telephone Encounter (Signed)
Should be ok to try the OTC meds at this time - Delsym, and /or consider mucinex DM

## 2018-02-22 ENCOUNTER — Encounter: Payer: Self-pay | Admitting: Physical Therapy

## 2018-02-22 ENCOUNTER — Ambulatory Visit: Payer: 59 | Admitting: Physical Therapy

## 2018-02-22 DIAGNOSIS — G8929 Other chronic pain: Secondary | ICD-10-CM

## 2018-02-22 DIAGNOSIS — M5442 Lumbago with sciatica, left side: Secondary | ICD-10-CM | POA: Diagnosis not present

## 2018-02-22 DIAGNOSIS — M6281 Muscle weakness (generalized): Secondary | ICD-10-CM

## 2018-02-22 DIAGNOSIS — M25552 Pain in left hip: Secondary | ICD-10-CM

## 2018-02-22 DIAGNOSIS — M5441 Lumbago with sciatica, right side: Principal | ICD-10-CM

## 2018-02-22 DIAGNOSIS — R262 Difficulty in walking, not elsewhere classified: Secondary | ICD-10-CM

## 2018-02-23 ENCOUNTER — Encounter: Payer: Self-pay | Admitting: Physical Therapy

## 2018-02-23 ENCOUNTER — Telehealth: Payer: Self-pay | Admitting: Pulmonary Disease

## 2018-02-23 ENCOUNTER — Encounter: Payer: Self-pay | Admitting: Pulmonary Disease

## 2018-02-23 DIAGNOSIS — G4733 Obstructive sleep apnea (adult) (pediatric): Secondary | ICD-10-CM

## 2018-02-23 HISTORY — DX: Obstructive sleep apnea (adult) (pediatric): G47.33
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
## 2018-02-23 NOTE — Telephone Encounter (Signed)
HST 02/17/18 >> AHI 19.1, SpO2 low 60%   Will have my nurse inform pt that sleep study shows moderate sleep apnea.  Options are 1) CPAP now, 2) ROV first.  If She is agreeable to CPAP, then please send order for auto CPAP range 5 to 15 cm H2O with heated humidity and mask of choice.  Have download sent 1 month after starting CPAP and set up ROV 2 months after starting CPAP.  ROV can be with me or NP.

## 2018-02-23 NOTE — Addendum Note (Signed)
Addended by: Boone MasterJONES, JESSICA E on: 02/23/2018 12:27 PM   Modules accepted: Orders

## 2018-02-24 ENCOUNTER — Ambulatory Visit: Payer: 59 | Admitting: Physical Therapy

## 2018-02-24 DIAGNOSIS — M6281 Muscle weakness (generalized): Secondary | ICD-10-CM

## 2018-02-24 DIAGNOSIS — G8929 Other chronic pain: Secondary | ICD-10-CM | POA: Diagnosis not present

## 2018-02-24 DIAGNOSIS — R262 Difficulty in walking, not elsewhere classified: Secondary | ICD-10-CM | POA: Diagnosis not present

## 2018-02-24 DIAGNOSIS — M5442 Lumbago with sciatica, left side: Secondary | ICD-10-CM | POA: Diagnosis not present

## 2018-02-24 DIAGNOSIS — M5441 Lumbago with sciatica, right side: Principal | ICD-10-CM

## 2018-02-24 DIAGNOSIS — M25552 Pain in left hip: Secondary | ICD-10-CM | POA: Diagnosis not present

## 2018-02-24 NOTE — Telephone Encounter (Signed)
ATC pt- unable to leave vm due to mailbox not being setup.  Will call back

## 2018-02-25 ENCOUNTER — Encounter: Payer: Self-pay | Admitting: Physical Therapy

## 2018-02-25 NOTE — Telephone Encounter (Signed)
Pt is returning call about results. Cb is [phone].
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
## 2018-02-25 NOTE — Telephone Encounter (Signed)
Called pt and advised message from the provider. Pt understood and verbalized understanding. Nothing further is needed.   Order placed.  

## 2018-03-01 ENCOUNTER — Ambulatory Visit: Payer: 59 | Admitting: Physical Therapy

## 2018-03-03 ENCOUNTER — Ambulatory Visit: Payer: 59 | Admitting: Physical Therapy

## 2018-03-08 ENCOUNTER — Ambulatory Visit: Payer: 59 | Admitting: Physical Therapy

## 2018-03-10 ENCOUNTER — Ambulatory Visit: Payer: 59 | Admitting: Physical Therapy

## 2018-03-11 ENCOUNTER — Ambulatory Visit: Payer: 59 | Admitting: Allergy

## 2018-03-17 ENCOUNTER — Ambulatory Visit: Payer: 59 | Admitting: Internal Medicine

## 2018-03-17 DIAGNOSIS — Z0289 Encounter for other administrative examinations: Secondary | ICD-10-CM

## 2018-04-01 ENCOUNTER — Encounter: Payer: Self-pay | Admitting: Internal Medicine

## 2018-04-01 ENCOUNTER — Ambulatory Visit (INDEPENDENT_AMBULATORY_CARE_PROVIDER_SITE_OTHER): Payer: 59 | Admitting: Internal Medicine

## 2018-04-01 ENCOUNTER — Other Ambulatory Visit (INDEPENDENT_AMBULATORY_CARE_PROVIDER_SITE_OTHER): Payer: 59

## 2018-04-01 VITALS — BP 126/82 | HR 98 | Temp 98.5°F | Ht 65.0 in | Wt 358.0 lb

## 2018-04-01 DIAGNOSIS — N939 Abnormal uterine and vaginal bleeding, unspecified: Secondary | ICD-10-CM

## 2018-04-01 DIAGNOSIS — Z0001 Encounter for general adult medical examination with abnormal findings: Secondary | ICD-10-CM

## 2018-04-01 DIAGNOSIS — F4321 Adjustment disorder with depressed mood: Secondary | ICD-10-CM | POA: Diagnosis not present

## 2018-04-01 DIAGNOSIS — G4733 Obstructive sleep apnea (adult) (pediatric): Secondary | ICD-10-CM

## 2018-04-01 DIAGNOSIS — M5416 Radiculopathy, lumbar region: Secondary | ICD-10-CM | POA: Diagnosis not present

## 2018-04-01 DIAGNOSIS — J45909 Unspecified asthma, uncomplicated: Secondary | ICD-10-CM | POA: Insufficient documentation

## 2018-04-01 DIAGNOSIS — J4531 Mild persistent asthma with (acute) exacerbation: Secondary | ICD-10-CM

## 2018-04-01 DIAGNOSIS — R739 Hyperglycemia, unspecified: Secondary | ICD-10-CM | POA: Diagnosis not present

## 2018-04-01 DIAGNOSIS — Z114 Encounter for screening for human immunodeficiency virus [HIV]: Secondary | ICD-10-CM

## 2018-04-01 LAB — CBC WITH DIFFERENTIAL/PLATELET
Basophils Absolute: 0.1 10*3/uL (ref 0.0–0.1)
Basophils Relative: 0.9 % (ref 0.0–3.0)
EOS PCT: 1.5 % (ref 0.0–5.0)
Eosinophils Absolute: 0.1 10*3/uL (ref 0.0–0.7)
HCT: 37.4 % (ref 36.0–46.0)
HEMOGLOBIN: 12.1 g/dL (ref 12.0–15.0)
LYMPHS ABS: 2.5 10*3/uL (ref 0.7–4.0)
Lymphocytes Relative: 30.1 % (ref 12.0–46.0)
MCHC: 32.5 g/dL (ref 30.0–36.0)
MCV: 83.8 fl (ref 78.0–100.0)
MONO ABS: 0.4 10*3/uL (ref 0.1–1.0)
Monocytes Relative: 5.1 % (ref 3.0–12.0)
Neutro Abs: 5.1 10*3/uL (ref 1.4–7.7)
Neutrophils Relative %: 62.4 % (ref 43.0–77.0)
Platelets: 264 10*3/uL (ref 150.0–400.0)
RBC: 4.46 Mil/uL (ref 3.87–5.11)
RDW: 18 % — AB (ref 11.5–15.5)
WBC: 8.2 10*3/uL (ref 4.0–10.5)

## 2018-04-01 LAB — HEPATIC FUNCTION PANEL
ALBUMIN: 3.4 g/dL — AB (ref 3.5–5.2)
ALT: 14 U/L (ref 0–35)
AST: 13 U/L (ref 0–37)
Alkaline Phosphatase: 71 U/L (ref 39–117)
BILIRUBIN TOTAL: 0.4 mg/dL (ref 0.2–1.2)
Bilirubin, Direct: 0.1 mg/dL (ref 0.0–0.3)
Total Protein: 8 g/dL (ref 6.0–8.3)

## 2018-04-01 LAB — IBC PANEL
Iron: 48 ug/dL (ref 42–145)
SATURATION RATIOS: 13.2 % — AB (ref 20.0–50.0)
Transferrin: 259 mg/dL (ref 212.0–360.0)

## 2018-04-01 LAB — URINALYSIS, ROUTINE W REFLEX MICROSCOPIC
BILIRUBIN URINE: NEGATIVE
KETONES UR: NEGATIVE
LEUKOCYTES UA: NEGATIVE
NITRITE: NEGATIVE
Specific Gravity, Urine: 1.025 (ref 1.000–1.030)
TOTAL PROTEIN, URINE-UPE24: NEGATIVE
URINE GLUCOSE: NEGATIVE
UROBILINOGEN UA: 0.2 (ref 0.0–1.0)
pH: 6 (ref 5.0–8.0)

## 2018-04-01 LAB — BASIC METABOLIC PANEL
BUN: 13 mg/dL (ref 6–23)
CALCIUM: 8.9 mg/dL (ref 8.4–10.5)
CHLORIDE: 104 meq/L (ref 96–112)
CO2: 28 meq/L (ref 19–32)
Creatinine, Ser: 0.83 mg/dL (ref 0.40–1.20)
GFR: 95.1 mL/min (ref >60.00)
Glucose, Bld: 105 mg/dL — ABNORMAL HIGH (ref 70–99)
Potassium: 4.1 mEq/L (ref 3.5–5.1)
Sodium: 139 mEq/L (ref 135–145)

## 2018-04-01 LAB — HEMOGLOBIN A1C: HEMOGLOBIN A1C: 6.6 % — AB (ref 4.6–6.5)

## 2018-04-01 LAB — LIPID PANEL
CHOL/HDL RATIO: 3
Cholesterol: 180 mg/dL (ref 0–200)
HDL: 67.7 mg/dL (ref >39.00)
LDL CALC: 97 mg/dL (ref 0–99)
NonHDL: 112.14
Triglycerides: 74 mg/dL (ref 0.0–149.0)
VLDL: 14.8 mg/dL (ref 0.0–40.0)

## 2018-04-01 LAB — TSH: TSH: 7.79 u[IU]/mL — AB (ref 0.35–4.50)

## 2018-04-01 MED ORDER — PREDNISONE 10 MG PO TABS: ORAL_TABLET | ORAL | 0 refills | Status: DC

## 2018-04-01 MED ORDER — METHYLPREDNISOLONE ACETATE 80 MG/ML IJ SUSP
80.0000 mg | Freq: Once | INTRAMUSCULAR | Status: AC
  Administered 2018-04-01: 80 mg via INTRAMUSCULAR

## 2018-04-01 NOTE — Assessment & Plan Note (Signed)
To f/u pulm, consider ENT for chronic tonsillitis

## 2018-04-01 NOTE — Assessment & Plan Note (Signed)
With persisent possible mild worsening, declines med tx, for referral for counseling

## 2018-04-01 NOTE — Assessment & Plan Note (Signed)

## 2018-04-01 NOTE — Patient Instructions (Addendum)
You had the steroid shot today - 4  Please take all new medication as prescribed  - the prednisone  Please continue all other medications as before, including the qvar and ventolin inhalers  Please have the pharmacy call with any other refills you may need.  Please continue your efforts at being more active, low cholesterol diet, and weight control.  You are otherwise up to date with prevention measures today.  Please keep your appointments with your specialists as you may have planned - GYN for the persistent bleeding  You will be contacted regarding the referral for: Pulmonary, and Orthopedic, and Counseling  Please go to the LAB in the Basement (turn left off the elevator) for the tests to be done today  You will be contacted by phone if any changes need to be made immediately.  Otherwise, you will receive a letter about your results with an explanation, but please check with MyChart first.  Please remember to sign up for MyChart if you have not done so, as this will be important to you in the future with finding out test results, communicating by private email, and scheduling acute appointments online when needed.  Please return in 6 months, or sooner if needed

## 2018-04-01 NOTE — Assessment & Plan Note (Addendum)
With mild exacerbation today with persistent mild increased symptoms, to cont higher dose qvar, ventolin prn, but also depomedrol IM 80, and predpac asd, f/u pulm as planned  In addition to the time spent performing CPE, I spent an additional 40 minutes face to face,in which greater than 50% of this time was spent in counseling and coordination of care for patient's acute illness as documented, including the differential dx, treatment, further evaluation and other management of depression, left lumbar radiculopathy, excessive menses, OSA, grief, asthma exacerbation,

## 2018-04-01 NOTE — Assessment & Plan Note (Signed)
Now with mild worsening LLE weakness, recent MRI mar 2019 reviewed, pt to referral back to Dr Rayburn Ma and hold on applying for disability for now
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
## 2018-04-01 NOTE — Assessment & Plan Note (Signed)
Pt encouraged to f/u with GYN, for iron with labs today

## 2018-04-02 LAB — HIV ANTIBODY (ROUTINE TESTING W REFLEX): HIV: NONREACTIVE

## 2018-04-26 ENCOUNTER — Other Ambulatory Visit: Payer: Self-pay | Admitting: Internal Medicine

## 2018-04-26 ENCOUNTER — Ambulatory Visit (INDEPENDENT_AMBULATORY_CARE_PROVIDER_SITE_OTHER): Payer: 59 | Admitting: Orthopaedic Surgery

## 2018-04-26 ENCOUNTER — Encounter (INDEPENDENT_AMBULATORY_CARE_PROVIDER_SITE_OTHER): Payer: Self-pay | Admitting: Orthopaedic Surgery

## 2018-04-26 DIAGNOSIS — M5441 Lumbago with sciatica, right side: Secondary | ICD-10-CM

## 2018-04-26 DIAGNOSIS — M5442 Lumbago with sciatica, left side: Secondary | ICD-10-CM

## 2018-04-26 MED FILL — LEVOTHYROXINE 200 MCG TAB: 200 | 90 days supply | Qty: 90 | Fill #2

## 2018-04-26 MED FILL — SUMATRIPTAN SUCC 100 MG TAB: 100 | 30 days supply | Qty: 8 | Fill #1

## 2018-04-26 MED FILL — HYDROCHLOROTHIAZIDE 25 MG T: 25 | 30 days supply | Qty: 30 | Fill #0

## 2018-04-26 MED FILL — VENTOLIN HFA 90 MCG INHALER: 108 (90 BAS | 25 days supply | Qty: 18 | Fill #1

## 2018-04-26 MED FILL — LIDOCAINE PATCH 5%: 5 | 30 days supply | Qty: 30 | Fill #2

## 2018-04-26 MED FILL — QVAR REDIHALER 80 MCG/ACT A: 80 | 30 days supply | Qty: 11 | Fill #1

## 2018-04-26 NOTE — Progress Notes (Signed)
The patient is someone I seen for years now.  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  She is someone who is morbidly obese with a BMI of almost 60.  She has significant rotator cuff disease of her shoulders.  She also has severe facet disease in the lumbar spine and this is causing worsening stenosis symptoms and spondylolisthesis.  Her MRI from this year is worsened in terms of stenosis compared to previous MRI.  I do feel a lot of her disease is related to facet joint arthropathy combined with her morbid obesity which is making things worse for her.  She is continue to work through the pain.  When she leans over forward flexes, she says she is more comfortable than when upright.  She is asking about the possibility of permanent disability.  On exam she has significant pain in the lumbar spine and pain with flexion extension of lumbar spine.  She is very slow to mobilize as a result of this.  I am happy to fill out any FMLA paperwork for her and heading toward disability as well.  I would like her to at least be seen by a spine specialist such as Dr. Otelia SergeantNitka so we can at least document whether or not she is a surgical candidate whether or not she is a candidate for disability as it relates to her lumbar spine.  I am happy to then see her back when she is been evaluated by spine specialist.  I did give her a note to keep her out of work today only.

## 2018-04-27 ENCOUNTER — Other Ambulatory Visit (INDEPENDENT_AMBULATORY_CARE_PROVIDER_SITE_OTHER): Payer: Self-pay

## 2018-04-28 ENCOUNTER — Telehealth (INDEPENDENT_AMBULATORY_CARE_PROVIDER_SITE_OTHER): Payer: Self-pay | Admitting: Orthopaedic Surgery

## 2018-04-28 MED ORDER — GABAPENTIN 300 MG PO CAPS: 300.0000 mg | ORAL_CAPSULE | Freq: Two times a day (BID) | ORAL | 1 refills | Status: DC

## 2018-04-28 MED FILL — GABAPENTIN 300 MG CAPSULE: 300 | 30 days supply | Qty: 60 | Fill #0

## 2018-04-28 NOTE — Telephone Encounter (Signed)
Patient requesting gabapentin prescription be sent to the [facility]. Patients # [phone]

## 2018-04-28 NOTE — Telephone Encounter (Signed)
Please advise 

## 2018-05-11 ENCOUNTER — Encounter (INDEPENDENT_AMBULATORY_CARE_PROVIDER_SITE_OTHER): Payer: Self-pay

## 2018-05-11 ENCOUNTER — Telehealth (INDEPENDENT_AMBULATORY_CARE_PROVIDER_SITE_OTHER): Payer: Self-pay | Admitting: Orthopaedic Surgery

## 2018-05-11 NOTE — Telephone Encounter (Signed)
Faxed note.

## 2018-05-11 NOTE — Telephone Encounter (Signed)
Patient called this morning stating that Dr. Magnus IvanBlackman had originally wrote her out for 6 days.  She stated that she did not have her FMLA paperwork until yesterday from her employer.  She missed 2 more days than what Dr. Magnus IvanBlackman stated she could be out.  She is needing a note for the two extra days (June 28 and June 30th).  She needs [facility] to [facility]  [phone].  Thank you.

## 2018-05-17 ENCOUNTER — Encounter: Payer: Self-pay | Admitting: Internal Medicine

## 2018-05-20 ENCOUNTER — Ambulatory Visit: Payer: 59 | Admitting: Psychology

## 2018-05-27 ENCOUNTER — Ambulatory Visit: Payer: Self-pay | Admitting: Psychology

## 2018-05-27 ENCOUNTER — Ambulatory Visit (INDEPENDENT_AMBULATORY_CARE_PROVIDER_SITE_OTHER): Payer: 59 | Admitting: Specialist

## 2018-05-27 ENCOUNTER — Ambulatory Visit (INDEPENDENT_AMBULATORY_CARE_PROVIDER_SITE_OTHER): Payer: 59

## 2018-05-27 ENCOUNTER — Encounter (INDEPENDENT_AMBULATORY_CARE_PROVIDER_SITE_OTHER): Payer: Self-pay | Admitting: Specialist

## 2018-05-27 VITALS — BP 128/88 | HR 75 | Ht 65.0 in | Wt 345.0 lb

## 2018-05-27 DIAGNOSIS — M4726 Other spondylosis with radiculopathy, lumbar region: Secondary | ICD-10-CM

## 2018-05-27 DIAGNOSIS — M48062 Spinal stenosis, lumbar region with neurogenic claudication: Secondary | ICD-10-CM

## 2018-05-27 DIAGNOSIS — M5442 Lumbago with sciatica, left side: Secondary | ICD-10-CM

## 2018-05-27 DIAGNOSIS — S46011S Strain of muscle(s) and tendon(s) of the rotator cuff of right shoulder, sequela: Secondary | ICD-10-CM | POA: Diagnosis not present

## 2018-05-27 DIAGNOSIS — M25562 Pain in left knee: Secondary | ICD-10-CM | POA: Diagnosis not present

## 2018-05-27 DIAGNOSIS — M4316 Spondylolisthesis, lumbar region: Secondary | ICD-10-CM

## 2018-05-27 DIAGNOSIS — M5441 Lumbago with sciatica, right side: Secondary | ICD-10-CM | POA: Diagnosis not present

## 2018-05-27 DIAGNOSIS — M170 Bilateral primary osteoarthritis of knee: Secondary | ICD-10-CM

## 2018-05-27 DIAGNOSIS — G4733 Obstructive sleep apnea (adult) (pediatric): Secondary | ICD-10-CM

## 2018-05-27 DIAGNOSIS — G8929 Other chronic pain: Secondary | ICD-10-CM

## 2018-05-27 DIAGNOSIS — Z6841 Body Mass Index (BMI) 40.0 and over, adult: Secondary | ICD-10-CM

## 2018-05-27 MED ORDER — ACETAMINOPHEN-CODEINE #4 300-60 MG PO TABS: 1.0000 | ORAL_TABLET | ORAL | 0 refills | Status: DC | PRN

## 2018-05-27 MED ORDER — DICLOFENAC SODIUM 50 MG PO TBEC: 50.0000 mg | DELAYED_RELEASE_TABLET | Freq: Three times a day (TID) | ORAL | 2 refills | Status: DC

## 2018-05-27 MED FILL — DICLOFENAC SOD EC 50 MG TAB: 50 | 30 days supply | Qty: 90 | Fill #0

## 2018-05-27 MED FILL — ACETAMINOPHEN/COD #4 TABLET: 300-60 | 5 days supply | Qty: 30 | Fill #0
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
## 2018-05-27 NOTE — Patient Instructions (Addendum)
Plan:  Avoid bending, stooping and avoid lifting weights greater than 10 lbs. Avoid prolong standing and walking. Avoid frequent bending and stooping  No lifting greater than 10 lbs. May use ice or moist heat for pain. Weight loss is of benefit. Handicap license is approved. The main ways of treat osteoarthritis, that are found to be success. Weight loss helps to decrease pain. Exercise is important to maintaining cartilage and thickness and strengthening. NSAIDs like motrin, tylenol, alleve are meds decreasing the inflamation. Ice is okay  In afternoon and evening and hot shower in the am Try  Flexion exercises or pool walking. See if you can go on disability for now as this is a condition that is not likely going to Allow continued gainful employment. Diclofenac for arthritis inflamation and stop naprosyn or alleve.  Stay on the gabapentin.  See your primary care physician or call your primary care physician to enquire as to placement into a weight loss program and further more a referral for consideration of bariatic surgery.

## 2018-05-28 ENCOUNTER — Telehealth (INDEPENDENT_AMBULATORY_CARE_PROVIDER_SITE_OTHER): Payer: Self-pay | Admitting: Specialist

## 2018-05-28 NOTE — Telephone Encounter (Signed)
I will give her a note closer to the date if she needs to come out of work. Vanessa [name]

## 2018-05-28 NOTE — Telephone Encounter (Signed)
Vanessa [name] called asked if Dr Otelia SergeantNitka can change her dates for being out of work from 06/11/18 through 07/12/18 so that she can get one more check in order to pay her bills. --Please advise

## 2018-05-28 NOTE — Telephone Encounter (Signed)
Patient called asked if Dr Otelia SergeantNitka can change her dates for being out of work from 06/11/18 through 07/12/18 so that she can get one more check in order to pay her bills. The number to contact patient is [phone]

## 2018-05-28 NOTE — Telephone Encounter (Signed)
Please fax note  [phone]

## 2018-05-31 ENCOUNTER — Telehealth (INDEPENDENT_AMBULATORY_CARE_PROVIDER_SITE_OTHER): Payer: Self-pay | Admitting: Specialist

## 2018-05-31 NOTE — Telephone Encounter (Signed)
Patient called asked if she can have the dates changed to 06/09/18 thru 07/10/18. Patient said she gets paid on the 8th. The number to contact patient is [phone]

## 2018-06-01 NOTE — Telephone Encounter (Signed)
I tried to call patient to advise her that per Dr. Otelia SergeantNitka, we will write a note when it get closer to these dates if it is needed. But her phone states that the VM has not been set up yet, I will continue to try to call her later.---see other message

## 2018-06-01 NOTE — Telephone Encounter (Signed)
I tried to call patient to advise her that per Dr. Otelia SergeantNitka, we will write a note when it get closer to these dates if it is needed. But her phone states that the VM has not been set up yet, I will continue to try to call her later.

## 2018-06-02 ENCOUNTER — Telehealth (INDEPENDENT_AMBULATORY_CARE_PROVIDER_SITE_OTHER): Payer: Self-pay | Admitting: Specialist

## 2018-06-02 NOTE — Telephone Encounter (Signed)
I called and advised patient of note from Dr. Otelia SergeantNitka

## 2018-06-02 NOTE — Telephone Encounter (Signed)
I called and advised patient of the message from Dr. Otelia SergeantNitka

## 2018-06-02 NOTE — Telephone Encounter (Signed)
Patient returned call asked for a call back. Patient said she will have phone turned up so that she can answer it. I read note from Dr Otelia SergeantNitka to patient.

## 2018-06-03 ENCOUNTER — Ambulatory Visit: Payer: Self-pay | Admitting: Psychology

## 2018-06-07 NOTE — Telephone Encounter (Signed)
06/09/18-07/12/18   Patient called asked if Dr.Nitka can change the date of work note

## 2018-06-08 ENCOUNTER — Encounter (INDEPENDENT_AMBULATORY_CARE_PROVIDER_SITE_OTHER): Payer: Self-pay | Admitting: Radiology

## 2018-06-08 NOTE — Telephone Encounter (Signed)
I spoke with Dr. Otelia SergeantNitka and asked him if I could do this note and he said it would be ok, also I called and advised the patient that this has been done and I put it at the front desk for pick up

## 2018-06-10 ENCOUNTER — Ambulatory Visit: Payer: Self-pay | Admitting: Primary Care

## 2018-06-18 ENCOUNTER — Other Ambulatory Visit (INDEPENDENT_AMBULATORY_CARE_PROVIDER_SITE_OTHER): Payer: Self-pay | Admitting: Physician Assistant

## 2018-06-18 ENCOUNTER — Other Ambulatory Visit: Payer: Self-pay | Admitting: Internal Medicine

## 2018-06-18 MED FILL — LIDOCAINE PATCH 5%: 5 | 30 days supply | Qty: 30 | Fill #3

## 2018-06-18 MED FILL — CYCLOBENZAPRINE 10 MG TAB: 10 | 10 days supply | Qty: 30 | Fill #0

## 2018-06-18 MED FILL — LOSARTAN POTASSIUM 100 MG T: 100 | 30 days supply | Qty: 30 | Fill #0

## 2018-06-18 MED FILL — HYDROCHLOROTHIAZIDE 25 MG T: 25 | 90 days supply | Qty: 90 | Fill #1

## 2018-06-18 NOTE — Telephone Encounter (Signed)
Please advise 

## 2018-07-13 ENCOUNTER — Encounter: Payer: Self-pay | Admitting: Internal Medicine

## 2018-07-15 ENCOUNTER — Ambulatory Visit (INDEPENDENT_AMBULATORY_CARE_PROVIDER_SITE_OTHER): Payer: 59 | Admitting: Specialist

## 2018-07-15 ENCOUNTER — Encounter (INDEPENDENT_AMBULATORY_CARE_PROVIDER_SITE_OTHER): Payer: Self-pay | Admitting: Specialist

## 2018-07-15 VITALS — BP 124/79 | HR 65 | Ht 65.0 in | Wt 345.0 lb

## 2018-07-15 DIAGNOSIS — M4316 Spondylolisthesis, lumbar region: Secondary | ICD-10-CM

## 2018-07-15 DIAGNOSIS — M48062 Spinal stenosis, lumbar region with neurogenic claudication: Secondary | ICD-10-CM | POA: Diagnosis not present

## 2018-07-15 DIAGNOSIS — M170 Bilateral primary osteoarthritis of knee: Secondary | ICD-10-CM

## 2018-07-15 MED ORDER — DICLOFENAC SODIUM 1 % TD GEL: 4.0000 g | Freq: Four times a day (QID) | TRANSDERMAL | 3 refills | Status: DC

## 2018-07-15 MED ORDER — DICLOFENAC SODIUM 50 MG PO TBEC: 50.0000 mg | DELAYED_RELEASE_TABLET | Freq: Three times a day (TID) | ORAL | 2 refills | Status: DC

## 2018-07-15 MED FILL — DICLOFENAC SODIUM 1% GEL: 1 | 7 days supply | Qty: 100 | Fill #0

## 2018-07-15 MED FILL — DICLOFENAC SOD EC 50 MG TAB: 50 | 30 days supply | Qty: 90 | Fill #0
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
## 2018-07-15 NOTE — Patient Instructions (Addendum)
Will try a LSO for the spondylolisthesis, advised to use only 1/2 days. Will try PT for back and the knee osteoarthritis.   Knee is suffering from osteoarthritis, only real proven treatments are Weight loss, NSIADs like diclofenac and exercise. Well padded shoes help. Ice the knee 2-3 times a day 15-20 mins at a time.

## 2018-07-23 ENCOUNTER — Encounter: Payer: Self-pay | Admitting: Physical Therapy

## 2018-07-23 ENCOUNTER — Ambulatory Visit: Payer: 59 | Attending: Specialist | Admitting: Physical Therapy

## 2018-07-23 DIAGNOSIS — M5441 Lumbago with sciatica, right side: Secondary | ICD-10-CM | POA: Diagnosis not present

## 2018-07-23 DIAGNOSIS — M5442 Lumbago with sciatica, left side: Secondary | ICD-10-CM | POA: Diagnosis not present

## 2018-07-23 DIAGNOSIS — M6281 Muscle weakness (generalized): Secondary | ICD-10-CM | POA: Diagnosis not present

## 2018-07-23 DIAGNOSIS — M170 Bilateral primary osteoarthritis of knee: Secondary | ICD-10-CM | POA: Diagnosis not present

## 2018-07-23 DIAGNOSIS — G8929 Other chronic pain: Secondary | ICD-10-CM | POA: Diagnosis not present
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
## 2018-07-30 ENCOUNTER — Encounter

## 2018-08-03 ENCOUNTER — Ambulatory Visit: Payer: 59 | Attending: Specialist

## 2018-08-03 DIAGNOSIS — G8929 Other chronic pain: Secondary | ICD-10-CM | POA: Diagnosis not present

## 2018-08-03 DIAGNOSIS — M25552 Pain in left hip: Secondary | ICD-10-CM | POA: Insufficient documentation

## 2018-08-03 DIAGNOSIS — M5441 Lumbago with sciatica, right side: Secondary | ICD-10-CM | POA: Insufficient documentation

## 2018-08-03 DIAGNOSIS — M5442 Lumbago with sciatica, left side: Secondary | ICD-10-CM | POA: Diagnosis not present

## 2018-08-03 DIAGNOSIS — M6281 Muscle weakness (generalized): Secondary | ICD-10-CM | POA: Diagnosis not present

## 2018-08-03 DIAGNOSIS — M170 Bilateral primary osteoarthritis of knee: Secondary | ICD-10-CM | POA: Insufficient documentation

## 2018-08-03 DIAGNOSIS — R262 Difficulty in walking, not elsewhere classified: Secondary | ICD-10-CM | POA: Diagnosis not present
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
## 2018-08-04 ENCOUNTER — Ambulatory Visit: Payer: 59 | Admitting: Physical Therapy

## 2018-08-10 ENCOUNTER — Ambulatory Visit: Payer: 59

## 2018-08-10 DIAGNOSIS — M25552 Pain in left hip: Secondary | ICD-10-CM | POA: Diagnosis not present

## 2018-08-10 DIAGNOSIS — M5442 Lumbago with sciatica, left side: Principal | ICD-10-CM

## 2018-08-10 DIAGNOSIS — G8929 Other chronic pain: Secondary | ICD-10-CM

## 2018-08-10 DIAGNOSIS — R262 Difficulty in walking, not elsewhere classified: Secondary | ICD-10-CM | POA: Diagnosis not present

## 2018-08-10 DIAGNOSIS — M6281 Muscle weakness (generalized): Secondary | ICD-10-CM | POA: Diagnosis not present

## 2018-08-10 DIAGNOSIS — M170 Bilateral primary osteoarthritis of knee: Secondary | ICD-10-CM

## 2018-08-10 DIAGNOSIS — M5441 Lumbago with sciatica, right side: Secondary | ICD-10-CM | POA: Diagnosis not present
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
## 2018-08-11 ENCOUNTER — Ambulatory Visit: Payer: 59 | Admitting: Physical Therapy

## 2018-08-11 ENCOUNTER — Encounter: Payer: Self-pay | Admitting: Physical Therapy

## 2018-08-11 DIAGNOSIS — R262 Difficulty in walking, not elsewhere classified: Secondary | ICD-10-CM | POA: Diagnosis not present

## 2018-08-11 DIAGNOSIS — M5442 Lumbago with sciatica, left side: Principal | ICD-10-CM

## 2018-08-11 DIAGNOSIS — M25552 Pain in left hip: Secondary | ICD-10-CM | POA: Diagnosis not present

## 2018-08-11 DIAGNOSIS — M6281 Muscle weakness (generalized): Secondary | ICD-10-CM | POA: Diagnosis not present

## 2018-08-11 DIAGNOSIS — M5441 Lumbago with sciatica, right side: Secondary | ICD-10-CM | POA: Diagnosis not present

## 2018-08-11 DIAGNOSIS — M170 Bilateral primary osteoarthritis of knee: Secondary | ICD-10-CM | POA: Diagnosis not present

## 2018-08-11 DIAGNOSIS — G8929 Other chronic pain: Secondary | ICD-10-CM | POA: Diagnosis not present
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
## 2018-08-12 MED FILL — LEVOTHYROXINE 200 MCG TAB: 200 | 90 days supply | Qty: 90 | Fill #3

## 2018-08-16 ENCOUNTER — Ambulatory Visit: Payer: 59 | Admitting: Physical Therapy

## 2018-08-18 ENCOUNTER — Ambulatory Visit: Payer: 59 | Admitting: Physical Therapy

## 2018-08-18 ENCOUNTER — Encounter: Payer: Self-pay | Admitting: Physical Therapy

## 2018-08-18 ENCOUNTER — Telehealth: Payer: Self-pay | Admitting: Physical Therapy

## 2018-08-18 NOTE — Telephone Encounter (Signed)
No show for appointment today. Pt. Reports forgot visit. First appointment next week will have to be rescheduled with PT for progress note/recertification-Patient will call to set this up.

## 2018-08-20 ENCOUNTER — Telehealth: Payer: Self-pay | Admitting: Internal Medicine

## 2018-08-20 NOTE — Telephone Encounter (Signed)
Sorry, I dont recall seeing or signing this form

## 2018-08-20 NOTE — Telephone Encounter (Signed)
I have tried to call patient to inform her that we do not have this form.  Patient's VM is not set up.  I will send her a mychart message.

## 2018-08-20 NOTE — Telephone Encounter (Signed)
Dr. Jonny Ruiz have you seen this form?

## 2018-08-20 NOTE — Telephone Encounter (Signed)
Copied from CRM [id number]. Topic: General - Other >> Aug 20, 2018  9:27 AM Gerrianne Scale wrote: Reason for CRM: pt calling stating that she had a form faxed last Tuesday to Dr Jonny Ruiz about her flu shot and want to know if its ready she states that her job gave her until the 23rd to get form filled out due to her not being able to take the flu shot pt works for [employer]  please call pt at [phone]

## 2018-08-23 ENCOUNTER — Ambulatory Visit: Payer: 59 | Admitting: Physical Therapy

## 2018-08-25 ENCOUNTER — Encounter: Payer: Self-pay | Admitting: Physical Therapy

## 2018-08-25 ENCOUNTER — Ambulatory Visit: Payer: 59 | Admitting: Physical Therapy

## 2018-08-25 DIAGNOSIS — M25552 Pain in left hip: Secondary | ICD-10-CM | POA: Diagnosis not present

## 2018-08-25 DIAGNOSIS — M5441 Lumbago with sciatica, right side: Secondary | ICD-10-CM | POA: Diagnosis not present

## 2018-08-25 DIAGNOSIS — G8929 Other chronic pain: Secondary | ICD-10-CM

## 2018-08-25 DIAGNOSIS — M5442 Lumbago with sciatica, left side: Principal | ICD-10-CM

## 2018-08-25 DIAGNOSIS — M6281 Muscle weakness (generalized): Secondary | ICD-10-CM | POA: Diagnosis not present

## 2018-08-25 DIAGNOSIS — R262 Difficulty in walking, not elsewhere classified: Secondary | ICD-10-CM | POA: Diagnosis not present

## 2018-08-25 DIAGNOSIS — M170 Bilateral primary osteoarthritis of knee: Secondary | ICD-10-CM | POA: Diagnosis not present
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
## 2018-08-30 ENCOUNTER — Encounter: Payer: Self-pay | Admitting: Physical Therapy

## 2018-08-30 ENCOUNTER — Ambulatory Visit: Payer: 59 | Admitting: Physical Therapy

## 2018-08-30 DIAGNOSIS — M6281 Muscle weakness (generalized): Secondary | ICD-10-CM

## 2018-08-30 DIAGNOSIS — R262 Difficulty in walking, not elsewhere classified: Secondary | ICD-10-CM | POA: Diagnosis not present

## 2018-08-30 DIAGNOSIS — G8929 Other chronic pain: Secondary | ICD-10-CM

## 2018-08-30 DIAGNOSIS — M25552 Pain in left hip: Secondary | ICD-10-CM | POA: Diagnosis not present

## 2018-08-30 DIAGNOSIS — M5442 Lumbago with sciatica, left side: Secondary | ICD-10-CM | POA: Diagnosis not present

## 2018-08-30 DIAGNOSIS — M170 Bilateral primary osteoarthritis of knee: Secondary | ICD-10-CM | POA: Diagnosis not present

## 2018-08-30 DIAGNOSIS — M5441 Lumbago with sciatica, right side: Secondary | ICD-10-CM | POA: Diagnosis not present

## 2018-08-30 NOTE — Patient Instructions (Signed)
On right side over a rolled beach towel .   Raise arm overhead and relax as a mild stretch is felt along ribs hip. 1 to 3 reps 30 seconds to 30 minutes.  Do as needed.
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
## 2018-09-01 ENCOUNTER — Ambulatory Visit: Payer: 59 | Admitting: Physical Therapy

## 2018-09-07 ENCOUNTER — Other Ambulatory Visit (INDEPENDENT_AMBULATORY_CARE_PROVIDER_SITE_OTHER): Payer: Self-pay | Admitting: Orthopaedic Surgery

## 2018-09-07 ENCOUNTER — Ambulatory Visit: Payer: Self-pay | Admitting: Physical Therapy

## 2018-09-07 MED FILL — LIDOCAINE PATCH 5%: 5 | 30 days supply | Qty: 30 | Fill #0

## 2018-09-07 MED FILL — LOSARTAN POTASSIUM 100 MG T: 100 | 30 days supply | Qty: 30 | Fill #1

## 2018-09-07 MED FILL — FLUTICASONE PROP 0.05% CRM: 0.05 | 15 days supply | Qty: 30 | Fill #1

## 2018-09-07 MED FILL — SUMAtriptan SUCCINATE 100 M: 100 | 30 days supply | Qty: 8 | Fill #2

## 2018-09-07 NOTE — Telephone Encounter (Signed)
Please advise 

## 2018-09-15 ENCOUNTER — Ambulatory Visit: Payer: 59 | Attending: Specialist

## 2018-09-15 ENCOUNTER — Ambulatory Visit: Payer: 59

## 2018-09-16 ENCOUNTER — Telehealth: Payer: Self-pay | Admitting: Physical Therapy
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
## 2018-09-16 NOTE — Telephone Encounter (Signed)
Called patient to check on status for therapy (no further visits scheduled). Patient requests discharge-she reports therapy has not helped and plans on following up with MD for further treatment options.

## 2018-10-04 DIAGNOSIS — M5136 Other intervertebral disc degeneration, lumbar region: Secondary | ICD-10-CM | POA: Diagnosis not present

## 2018-10-04 DIAGNOSIS — M4316 Spondylolisthesis, lumbar region: Secondary | ICD-10-CM | POA: Diagnosis not present

## 2018-10-04 DIAGNOSIS — M545 Low back pain: Secondary | ICD-10-CM | POA: Diagnosis not present

## 2018-10-04 DIAGNOSIS — Z6841 Body Mass Index (BMI) 40.0 and over, adult: Secondary | ICD-10-CM | POA: Diagnosis not present

## 2018-10-04 DIAGNOSIS — I10 Essential (primary) hypertension: Secondary | ICD-10-CM | POA: Diagnosis not present

## 2018-10-04 DIAGNOSIS — M5416 Radiculopathy, lumbar region: Secondary | ICD-10-CM | POA: Diagnosis not present

## 2018-10-08 ENCOUNTER — Encounter (INDEPENDENT_AMBULATORY_CARE_PROVIDER_SITE_OTHER): Payer: Self-pay | Admitting: Specialist

## 2018-10-08 ENCOUNTER — Telehealth (INDEPENDENT_AMBULATORY_CARE_PROVIDER_SITE_OTHER): Payer: Self-pay | Admitting: Radiology

## 2018-10-08 NOTE — Telephone Encounter (Signed)
Patient called requesting a letter for her employer.  Patient states that she call in to last Friday, Sunday and Tuesday due to her back flaring up.  She states that her supervisor moved her from the area that she was in to another area.  She states that the new area is harder on her and her back.  She states that her FLMA is there to cover these days.  She states she would like the note to state that she would should remain where she was as it was not as hard on her.  Please advise.

## 2018-10-08 NOTE — Telephone Encounter (Signed)
Letter in the computer and printed.

## 2018-10-11 NOTE — Telephone Encounter (Signed)
I tried to call but her VM has not been setup yet---Her letter is ready for pick up at the front desk

## 2018-10-14 ENCOUNTER — Telehealth (INDEPENDENT_AMBULATORY_CARE_PROVIDER_SITE_OTHER): Payer: Self-pay | Admitting: Orthopaedic Surgery

## 2018-10-19 ENCOUNTER — Telehealth (INDEPENDENT_AMBULATORY_CARE_PROVIDER_SITE_OTHER): Payer: Self-pay | Admitting: Specialist

## 2018-10-19 NOTE — Telephone Encounter (Signed)
Patient called asked if I would read the note from Dr Nitka to her. I read the note to her. Patient asked if the note can state she was out of work for for two days because of a flare up in her back. Patient said she was told she missed to many days out of work to keep her area up by her supervisor. Patient said she was moved to a different area. Patient said she is able to move back to her old position and asked if the note can state that. Patient said the area she was moved to is harder on her back. Patient asked for a call back as soon as possible. The number to contact patient is [phone]  

## 2018-10-20 NOTE — Telephone Encounter (Signed)
Patient called asked if I would read the note from Dr Otelia SergeantNitka to her. I read the note to her. Patient asked if the note can state she was out of work for for two days because of a flare up in her back. Patient said she was told she missed to many days out of work to keep her area up by her supervisor. Patient said she was moved to a different area. Patient said she is able to move back to her old position and asked if the note can state that. Patient said the area she was moved to is harder on her back. Patient asked for a call back as soon as possible. The number to contact patient is [phone]

## 2018-10-22 ENCOUNTER — Telehealth (INDEPENDENT_AMBULATORY_CARE_PROVIDER_SITE_OTHER): Payer: Self-pay | Admitting: Specialist

## 2018-10-22 NOTE — Telephone Encounter (Signed)
Patient called left VM to mail letter instead of pickup. I called patient to verify information no answer. Letter is currently removed from the drawer I have it at my desk.

## 2018-10-25 ENCOUNTER — Other Ambulatory Visit (INDEPENDENT_AMBULATORY_CARE_PROVIDER_SITE_OTHER): Payer: Self-pay | Admitting: Orthopaedic Surgery

## 2018-10-25 ENCOUNTER — Other Ambulatory Visit: Payer: Self-pay | Admitting: Internal Medicine

## 2018-10-25 DIAGNOSIS — Z130 Encounter for screening for diseases of the blood and blood-forming organs and certain disorders involving the immune mechanism: Secondary | ICD-10-CM | POA: Diagnosis not present

## 2018-10-25 DIAGNOSIS — Z1389 Encounter for screening for other disorder: Secondary | ICD-10-CM | POA: Diagnosis not present

## 2018-10-25 DIAGNOSIS — M25512 Pain in left shoulder: Secondary | ICD-10-CM | POA: Diagnosis not present

## 2018-10-25 DIAGNOSIS — Z1231 Encounter for screening mammogram for malignant neoplasm of breast: Secondary | ICD-10-CM | POA: Diagnosis not present

## 2018-10-25 DIAGNOSIS — Z6841 Body Mass Index (BMI) 40.0 and over, adult: Secondary | ICD-10-CM | POA: Diagnosis not present

## 2018-10-25 DIAGNOSIS — Z01419 Encounter for gynecological examination (general) (routine) without abnormal findings: Secondary | ICD-10-CM | POA: Diagnosis not present

## 2018-10-25 MED FILL — NAPROXEN 500 MG TABLET: 500 | 30 days supply | Qty: 60 | Fill #1

## 2018-10-25 NOTE — Telephone Encounter (Signed)
Please advise 

## 2018-10-26 MED FILL — CYCLOBENZAPRINE 10 MG TAB: 10 | 10 days supply | Qty: 30 | Fill #0

## 2018-10-28 MED FILL — LEVOTHYROXINE 200 MCG TAB: 200 | 90 days supply | Qty: 90 | Fill #0

## 2018-11-04 NOTE — Telephone Encounter (Signed)
Can you mail it to her? Or you did?

## 2018-11-25 ENCOUNTER — Ambulatory Visit: Payer: Self-pay | Admitting: Internal Medicine

## 2018-11-25 DIAGNOSIS — Z0289 Encounter for other administrative examinations: Secondary | ICD-10-CM

## 2018-12-03 ENCOUNTER — Telehealth (INDEPENDENT_AMBULATORY_CARE_PROVIDER_SITE_OTHER): Payer: Self-pay | Admitting: Specialist

## 2018-12-03 NOTE — Telephone Encounter (Signed)
Patient called asked if the letter that was written back on 10/22/18 be faxed over to [employer].  AttnLelon Mast    The number to contact patient is [phone]

## 2018-12-06 NOTE — Telephone Encounter (Signed)
Note from 10/08/18 was faxed

## 2018-12-16 ENCOUNTER — Telehealth (INDEPENDENT_AMBULATORY_CARE_PROVIDER_SITE_OTHER): Payer: Self-pay | Admitting: Specialist

## 2018-12-16 NOTE — Telephone Encounter (Signed)
Patient called asked if Dr Vanessa [name] will write her a note stating patient need to sit down from time to time at work. Patient said she is having pain in her back and legs. Patient asked if the note can be faxed to [employer] as well. The number to contact patient is [phone]

## 2018-12-17 ENCOUNTER — Encounter (INDEPENDENT_AMBULATORY_CARE_PROVIDER_SITE_OTHER): Payer: Self-pay | Admitting: Radiology

## 2018-12-17 NOTE — Telephone Encounter (Signed)
Message on phone said her VM was not setup yet.

## 2018-12-17 NOTE — Telephone Encounter (Signed)
Note has been faxed to [employer]. I will mail her a copy of the letter to her home address in her chart

## 2018-12-22 ENCOUNTER — Emergency Department (HOSPITAL_COMMUNITY)
Admission: EM | Admit: 2018-12-22 | Discharge: 2018-12-22 | Disposition: A | Discharge status: Home / Self Care | Payer: 59 | Attending: Emergency Medicine | Admitting: Emergency Medicine

## 2018-12-22 ENCOUNTER — Encounter (HOSPITAL_COMMUNITY): Payer: Self-pay | Admitting: *Deleted

## 2018-12-22 ENCOUNTER — Emergency Department (HOSPITAL_COMMUNITY): Payer: 59

## 2018-12-22 DIAGNOSIS — J45909 Unspecified asthma, uncomplicated: Secondary | ICD-10-CM | POA: Insufficient documentation

## 2018-12-22 DIAGNOSIS — M79642 Pain in left hand: Secondary | ICD-10-CM | POA: Diagnosis not present

## 2018-12-22 DIAGNOSIS — Z79899 Other long term (current) drug therapy: Secondary | ICD-10-CM | POA: Insufficient documentation

## 2018-12-22 DIAGNOSIS — E039 Hypothyroidism, unspecified: Secondary | ICD-10-CM | POA: Diagnosis not present

## 2018-12-22 DIAGNOSIS — I10 Essential (primary) hypertension: Secondary | ICD-10-CM | POA: Diagnosis not present

## 2018-12-22 DIAGNOSIS — M25532 Pain in left wrist: Secondary | ICD-10-CM | POA: Diagnosis not present

## 2018-12-22 NOTE — Discharge Instructions (Addendum)
As discussed, it is important that you monitor your condition carefully, and keep your wrist splint in place. In 1 week you should have repeat evaluation with our orthopedic colleague. Please be sure to call occupational health today to discuss your injury. For additional pain control beyond using the splint, please use ibuprofen, 400 mg, 3 times daily, and Tylenol, 500 mg, up to 3 times daily.
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
## 2018-12-22 NOTE — ED Triage Notes (Signed)
Pt in c/o left hand pain after injury at work on Friday, today she hit it again at work, c/o increased pain with movement

## 2018-12-27 ENCOUNTER — Other Ambulatory Visit (INDEPENDENT_AMBULATORY_CARE_PROVIDER_SITE_OTHER): Payer: Self-pay | Admitting: Specialist

## 2018-12-27 ENCOUNTER — Other Ambulatory Visit: Payer: Self-pay | Admitting: Internal Medicine

## 2018-12-27 ENCOUNTER — Other Ambulatory Visit (INDEPENDENT_AMBULATORY_CARE_PROVIDER_SITE_OTHER): Payer: Self-pay | Admitting: Orthopaedic Surgery

## 2018-12-27 DIAGNOSIS — L309 Dermatitis, unspecified: Secondary | ICD-10-CM

## 2018-12-27 MED FILL — CYCLOBENZAPRINE 10 MG TAB: 10 | 10 days supply | Qty: 30 | Fill #0

## 2018-12-27 MED FILL — LIDOCAINE PATCH 5%: 5 | 30 days supply | Qty: 30 | Fill #1

## 2018-12-27 MED FILL — FLUTICASONE PROP 0.05% CRM: 0.05 | 7 days supply | Qty: 30 | Fill #0

## 2018-12-27 MED FILL — traMADol HCL 50 MG TABS: 50 | 20 days supply | Qty: 60 | Fill #0

## 2018-12-27 MED FILL — ACETAMINOPHEN/COD #4 TABLET: 300-60 | 5 days supply | Qty: 30 | Fill #0

## 2018-12-27 MED FILL — HYDROCHLOROTHIAZIDE 25 MG T: 25 | 90 days supply | Qty: 90 | Fill #2

## 2018-12-27 NOTE — Telephone Encounter (Signed)
Tylenol #4- refill request  

## 2018-12-27 NOTE — Telephone Encounter (Signed)
Done erx 

## 2018-12-27 NOTE — Telephone Encounter (Signed)
error 

## 2018-12-27 NOTE — Telephone Encounter (Signed)
Please advise 

## 2018-12-30 DIAGNOSIS — M25532 Pain in left wrist: Secondary | ICD-10-CM | POA: Insufficient documentation

## 2018-12-30 DIAGNOSIS — M65832 Other synovitis and tenosynovitis, left forearm: Secondary | ICD-10-CM | POA: Diagnosis not present

## 2019-01-26 ENCOUNTER — Other Ambulatory Visit (INDEPENDENT_AMBULATORY_CARE_PROVIDER_SITE_OTHER): Payer: Self-pay | Admitting: Specialist

## 2019-01-26 MED ORDER — TRAMADOL HCL 50 MG PO TABS: 50.0000 mg | ORAL_TABLET | Freq: Three times a day (TID) | ORAL | 1 refills | Status: DC | PRN

## 2019-01-26 NOTE — Telephone Encounter (Signed)
New Message  Pt verbalized she is in a lot of pain and wondering if we can send a rx over.  Pt verbalized Tylenol does not work at all.  Pt verbalized she thinks her arthritis is bothering her but she's in a lot pain.  Please f/u with pt

## 2019-01-26 NOTE — Telephone Encounter (Signed)
Meds sent it

## 2019-04-04 ENCOUNTER — Ambulatory Visit (INDEPENDENT_AMBULATORY_CARE_PROVIDER_SITE_OTHER): Payer: 59 | Admitting: Internal Medicine

## 2019-04-04 ENCOUNTER — Encounter: Payer: Self-pay | Admitting: Internal Medicine

## 2019-04-04 ENCOUNTER — Other Ambulatory Visit (INDEPENDENT_AMBULATORY_CARE_PROVIDER_SITE_OTHER): Payer: Self-pay | Admitting: Orthopaedic Surgery

## 2019-04-04 ENCOUNTER — Other Ambulatory Visit (INDEPENDENT_AMBULATORY_CARE_PROVIDER_SITE_OTHER): Payer: Self-pay | Admitting: Specialist

## 2019-04-04 ENCOUNTER — Other Ambulatory Visit: Payer: Self-pay | Admitting: Internal Medicine

## 2019-04-04 DIAGNOSIS — Z0001 Encounter for general adult medical examination with abnormal findings: Secondary | ICD-10-CM | POA: Diagnosis not present

## 2019-04-04 DIAGNOSIS — H01004 Unspecified blepharitis left upper eyelid: Secondary | ICD-10-CM | POA: Diagnosis not present

## 2019-04-04 DIAGNOSIS — E559 Vitamin D deficiency, unspecified: Secondary | ICD-10-CM

## 2019-04-04 DIAGNOSIS — R739 Hyperglycemia, unspecified: Secondary | ICD-10-CM | POA: Insufficient documentation

## 2019-04-04 DIAGNOSIS — E039 Hypothyroidism, unspecified: Secondary | ICD-10-CM

## 2019-04-04 DIAGNOSIS — E611 Iron deficiency: Secondary | ICD-10-CM

## 2019-04-04 DIAGNOSIS — H0014 Chalazion left upper eyelid: Secondary | ICD-10-CM | POA: Diagnosis not present

## 2019-04-04 DIAGNOSIS — E538 Deficiency of other specified B group vitamins: Secondary | ICD-10-CM

## 2019-04-04 MED ORDER — TRAMADOL HCL 50 MG PO TABS: 50.0000 mg | ORAL_TABLET | Freq: Three times a day (TID) | ORAL | 1 refills | Status: DC | PRN

## 2019-04-04 MED ORDER — DOXYCYCLINE HYCLATE 100 MG PO TABS: 100.0000 mg | ORAL_TABLET | Freq: Two times a day (BID) | ORAL | 0 refills | Status: DC

## 2019-04-04 MED FILL — CYCLOBENZAPRINE 10 MG TAB: 10 | 10 days supply | Qty: 30 | Fill #0

## 2019-04-04 MED FILL — DOXYCYCLINE HYCLATE 100 MG: 100 | 10 days supply | Qty: 20 | Fill #0

## 2019-04-04 MED FILL — SUMAtriptan SUCCINATE 100 M: 100 | 30 days supply | Qty: 9 | Fill #0

## 2019-04-04 MED FILL — traMADol HCL 50 MG TABS: 50 | 20 days supply | Qty: 60 | Fill #0

## 2019-04-04 MED FILL — LIDOCAINE PATCH 5%: 5 | 30 days supply | Qty: 30 | Fill #2

## 2019-04-04 MED FILL — LEVOTHYROXINE 200 MCG TAB: 200 | 90 days supply | Qty: 90 | Fill #1

## 2019-04-04 MED FILL — HYDROCHLOROTHIAZIDE 25 MG T: 25 | 90 days supply | Qty: 90 | Fill #3

## 2019-04-04 NOTE — Patient Instructions (Signed)
Please take all new medication as prescribed - the antibiotic, and pain medication if needed  Please call if you change your mind about a referral to [facility] for the left upper eyelid swollen area  Please continue all other medications as before, and refills have been done if requested.  Please have the pharmacy call with any other refills you may need.  Please continue your efforts at being more active, low cholesterol diet, and weight control.  You are otherwise up to date with prevention measures today.  Please keep your appointments with your specialists as you may have planned  Please go to the LAB in the Basement (turn left off the elevator) for the tests to be done today  You will be contacted by phone if any changes need to be made immediately.  Otherwise, you will receive a letter about your results with an explanation, but please check with MyChart first.  .Please remember to sign up for MyChart if you have not done so, as this will be important to you in the future with finding out test results, communicating by private email, and scheduling acute appointments online when needed.  Please return in 6 months, or sooner if needed, with Lab testing done 3-5 days before

## 2019-04-04 NOTE — Assessment & Plan Note (Signed)
stable overall by history and exam, recent data reviewed with pt, and pt to continue medical treatment as before,  to f/u any worsening symptoms or concerns  

## 2019-04-04 NOTE — Telephone Encounter (Signed)
Rx request 

## 2019-04-04 NOTE — Assessment & Plan Note (Signed)

## 2019-04-04 NOTE — Assessment & Plan Note (Addendum)
Mild to mod, for antibx course,  to f/u any worsening symptoms or concerns  In addition to the time spent performing CPE, I spent an additional 25 minutes face to face,in which greater than 50% of this time was spent in counseling and coordination of care for patient's acute illness as documented, including the differential dx, treatment, further evaluation and other management of left eyelid blepharitis., chalazion, hyperglycemia, hypothryoidism

## 2019-04-04 NOTE — Assessment & Plan Note (Signed)
D/w pt, declines optho referral for now
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
## 2019-04-05 MED FILL — ACETAMINOPHEN/COD #4 TABLET: 300-60 | 5 days supply | Qty: 30 | Fill #0

## 2019-04-25 IMAGING — DX DG KNEE STANDING AP BILAT
1 series · 2 of 2 positions shown · non-contrast
Comparison: None in PACs

CLINICAL DATA: Bilateral knee pain greater on the left for the past
8 months. No known injury.

EXAM:
BILATERAL KNEES STANDING - 1 VIEW

[Series 1: knee ap · 0.14mm/px · 2 of 2 slices shown]
[im 1/2]
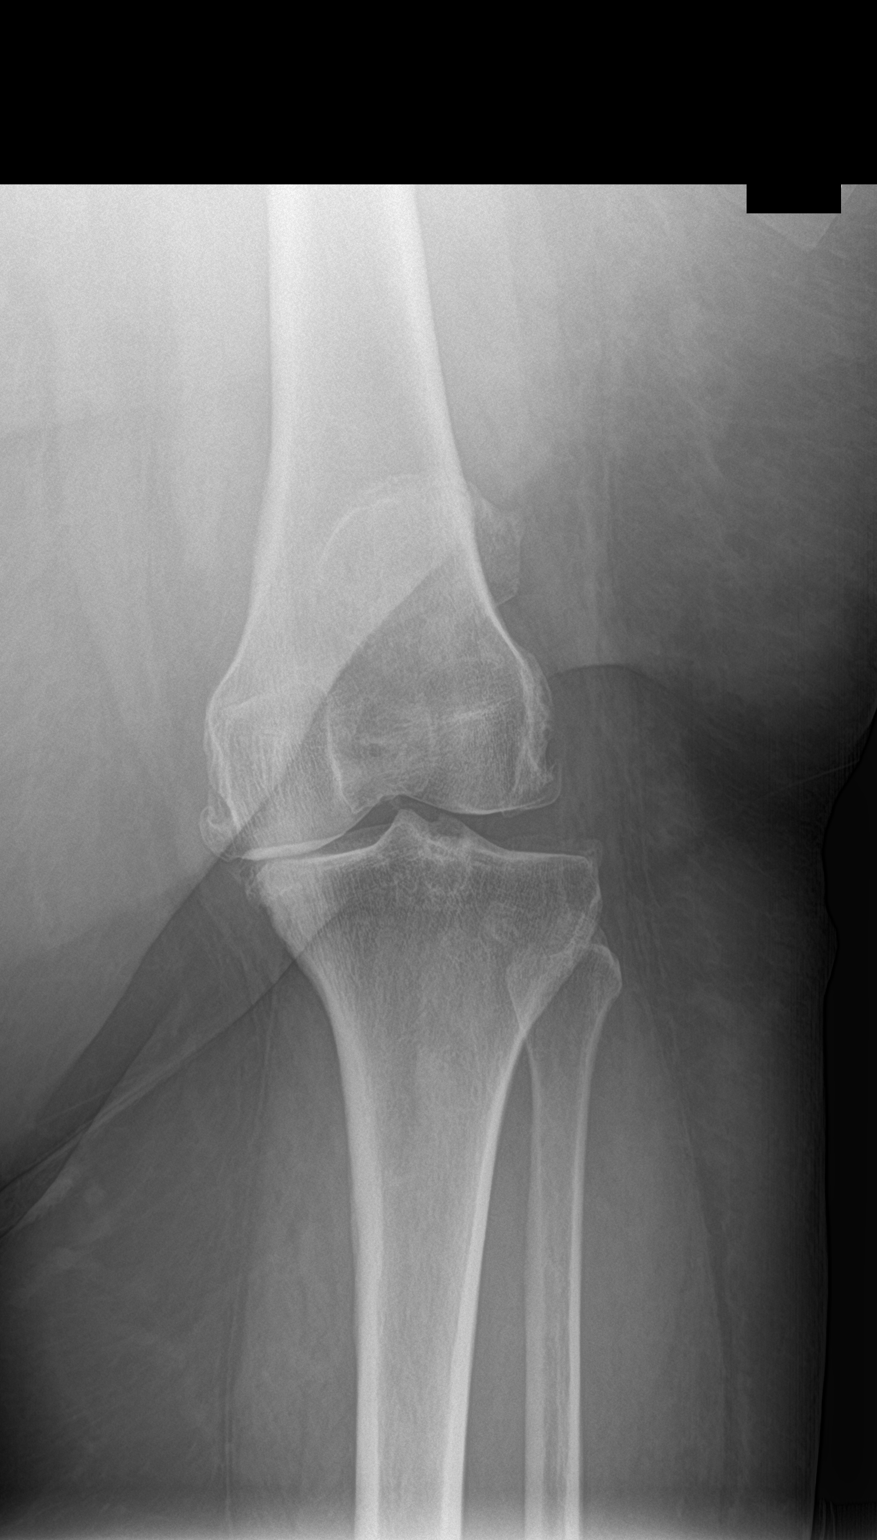
[im 2/2]
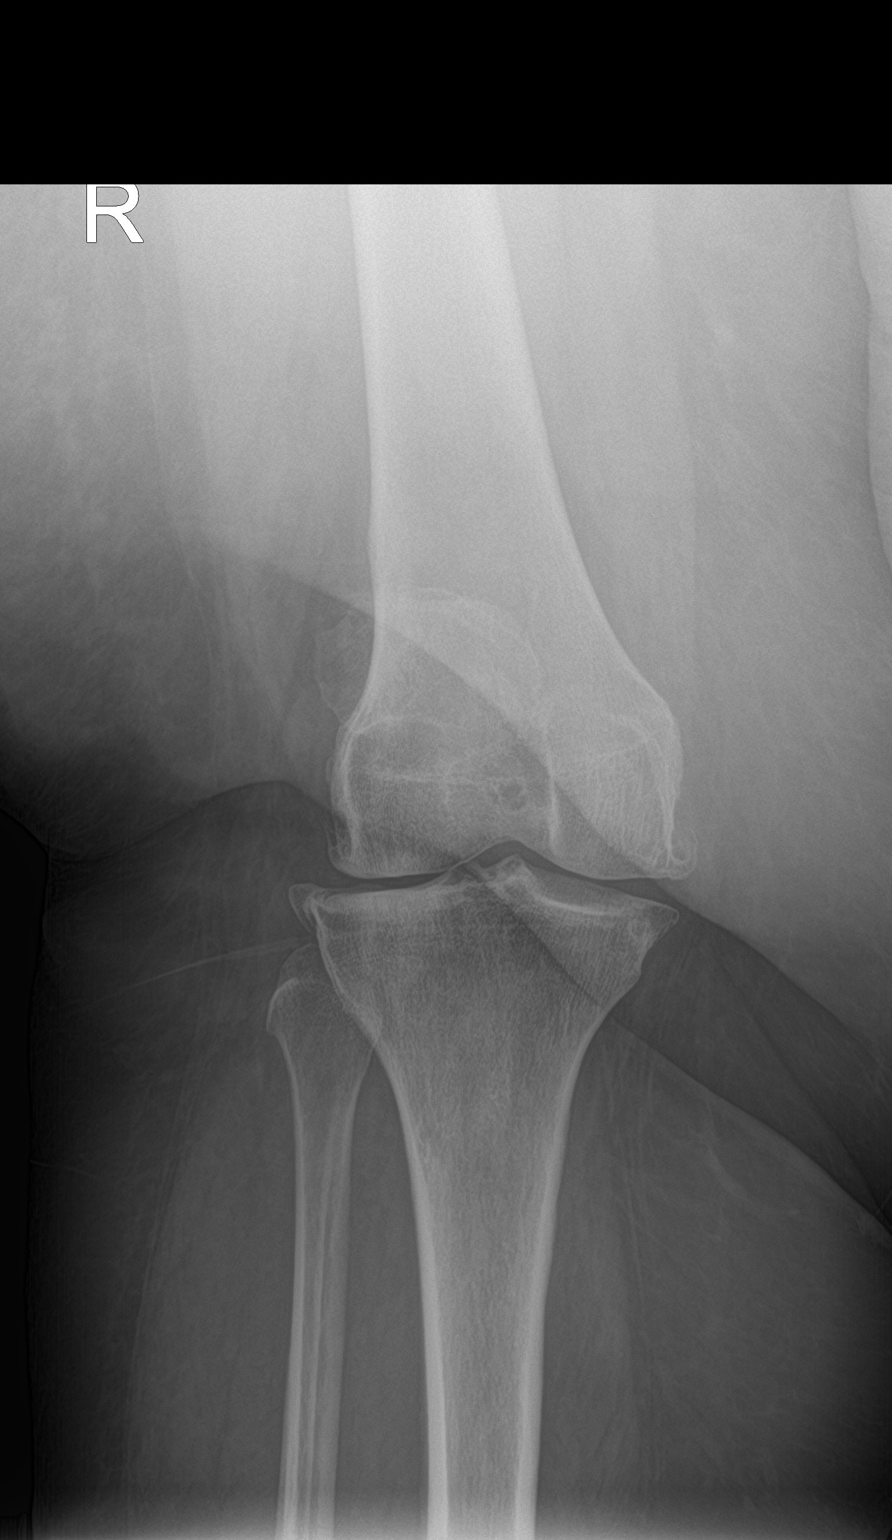

[2 of 2 positions shown; findings below may reference images not displayed]

FINDINGS: The bones are subjectively adequately mineralized. On the right
there is mild lateral subluxation of the tibia and fibula with
respect to the distal femur. There narrowing of the lateral joint
compartment. Spurs arise from the articular margins of the femoral
condyles and tibial plateaus. There is mild beaking of the tibial
spines.

On the left there is lateral subluxation of the tibia and fibula
with respect to the femoral condyles. There is marked narrowing of
the medial joint compartment. There are spurs arising from the
articular margins of the femoral condyles and tibial plateaus. There
beaking of the tibial spines.
IMPRESSION: Moderate osteoarthritic changes of both knees as described. The
amount of joint space loss is greatest on the left where the medial
joint space is markedly narrowed.

## 2019-05-11 ENCOUNTER — Telehealth: Payer: Self-pay | Admitting: Specialist

## 2019-05-11 NOTE — Telephone Encounter (Signed)
Patient called asked if she can get a doctor's note for Thursday 05/05/2019 and Monday 05/09/2019 due to her back and knee hurting. Patient said she was put under corrective action because she went home in pain. Patient said she could not do the job that was assigned to her. The number to contact patient is [phone]

## 2019-05-11 NOTE — Telephone Encounter (Signed)
Patient called asked if she can get a doctor's note for Thursday 05/05/2019 and Monday 05/09/2019 due to her back and knee hurting. Patient said she was put under corrective action because she went home in pain. Patient said she could not do the job that was assigned to her. The number to contact patient is [phone]-----Please advise 

## 2019-05-11 NOTE — Telephone Encounter (Signed)
Patient called asked if she can get a doctor's note for Thursday 05/05/2019 and Monday 05/09/2019 due to her back and knee hurting. Patient said she was put under corrective action because she went home in pain. Patient said she could not do the job that was assigned to her. The number to contact patient is [phone]-----Please advise

## 2019-05-12 ENCOUNTER — Ambulatory Visit: Payer: Self-pay

## 2019-05-12 ENCOUNTER — Encounter: Payer: Self-pay | Admitting: Surgery

## 2019-05-12 ENCOUNTER — Ambulatory Visit (INDEPENDENT_AMBULATORY_CARE_PROVIDER_SITE_OTHER): Payer: 59 | Admitting: Surgery

## 2019-05-12 ENCOUNTER — Other Ambulatory Visit: Payer: Self-pay

## 2019-05-12 VITALS — Wt 330.0 lb

## 2019-05-12 DIAGNOSIS — M48062 Spinal stenosis, lumbar region with neurogenic claudication: Secondary | ICD-10-CM

## 2019-05-12 DIAGNOSIS — M4316 Spondylolisthesis, lumbar region: Secondary | ICD-10-CM

## 2019-05-12 DIAGNOSIS — M5136 Other intervertebral disc degeneration, lumbar region: Secondary | ICD-10-CM | POA: Diagnosis not present

## 2019-05-12 DIAGNOSIS — M51369 Other intervertebral disc degeneration, lumbar region without mention of lumbar back pain or lower extremity pain: Secondary | ICD-10-CM

## 2019-05-12 DIAGNOSIS — M5416 Radiculopathy, lumbar region: Secondary | ICD-10-CM

## 2019-05-12 DIAGNOSIS — M4317 Spondylolisthesis, lumbosacral region: Secondary | ICD-10-CM | POA: Diagnosis not present

## 2019-05-12 MED ORDER — METHYLPREDNISOLONE 4 MG PO TABS: ORAL_TABLET | ORAL | 0 refills | Status: DC
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
## 2019-05-16 ENCOUNTER — Ambulatory Visit (INDEPENDENT_AMBULATORY_CARE_PROVIDER_SITE_OTHER): Payer: 59 | Admitting: Internal Medicine

## 2019-05-16 ENCOUNTER — Encounter: Payer: Self-pay | Admitting: Internal Medicine

## 2019-05-16 ENCOUNTER — Other Ambulatory Visit: Payer: Self-pay

## 2019-05-16 ENCOUNTER — Other Ambulatory Visit (INDEPENDENT_AMBULATORY_CARE_PROVIDER_SITE_OTHER): Payer: 59

## 2019-05-16 DIAGNOSIS — K5900 Constipation, unspecified: Secondary | ICD-10-CM | POA: Diagnosis not present

## 2019-05-16 DIAGNOSIS — R739 Hyperglycemia, unspecified: Secondary | ICD-10-CM

## 2019-05-16 DIAGNOSIS — E538 Deficiency of other specified B group vitamins: Secondary | ICD-10-CM | POA: Diagnosis not present

## 2019-05-16 DIAGNOSIS — Z0001 Encounter for general adult medical examination with abnormal findings: Secondary | ICD-10-CM | POA: Diagnosis not present

## 2019-05-16 DIAGNOSIS — E611 Iron deficiency: Secondary | ICD-10-CM

## 2019-05-16 DIAGNOSIS — E039 Hypothyroidism, unspecified: Secondary | ICD-10-CM | POA: Diagnosis not present

## 2019-05-16 DIAGNOSIS — E559 Vitamin D deficiency, unspecified: Secondary | ICD-10-CM

## 2019-05-16 DIAGNOSIS — K644 Residual hemorrhoidal skin tags: Secondary | ICD-10-CM

## 2019-05-16 LAB — CBC WITH DIFFERENTIAL/PLATELET
Basophils Absolute: 0.1 10*3/uL (ref 0.0–0.1)
Basophils Relative: 0.8 % (ref 0.0–3.0)
Eosinophils Absolute: 0.1 10*3/uL (ref 0.0–0.7)
Eosinophils Relative: 1.7 % (ref 0.0–5.0)
HCT: 40.8 % (ref 36.0–46.0)
Hemoglobin: 13.4 g/dL (ref 12.0–15.0)
Lymphocytes Relative: 37.9 % (ref 12.0–46.0)
Lymphs Abs: 3.3 10*3/uL (ref 0.7–4.0)
MCHC: 32.8 g/dL (ref 30.0–36.0)
MCV: 87.3 fl (ref 78.0–100.0)
Monocytes Absolute: 0.5 10*3/uL (ref 0.1–1.0)
Monocytes Relative: 6 % (ref 3.0–12.0)
Neutro Abs: 4.6 10*3/uL (ref 1.4–7.7)
Neutrophils Relative %: 53.6 % (ref 43.0–77.0)
Platelets: 279 10*3/uL (ref 150.0–400.0)
RBC: 4.68 Mil/uL (ref 3.87–5.11)
RDW: 17.6 % — ABNORMAL HIGH (ref 11.5–15.5)
WBC: 8.6 10*3/uL (ref 4.0–10.5)

## 2019-05-16 LAB — URINALYSIS, ROUTINE W REFLEX MICROSCOPIC
Bilirubin Urine: NEGATIVE
Ketones, ur: NEGATIVE
Leukocytes,Ua: NEGATIVE
Nitrite: NEGATIVE
Specific Gravity, Urine: 1.025 (ref 1.000–1.030)
Total Protein, Urine: NEGATIVE
Urine Glucose: NEGATIVE
Urobilinogen, UA: 0.2 (ref 0.0–1.0)
pH: 6 (ref 5.0–8.0)

## 2019-05-16 LAB — BASIC METABOLIC PANEL
BUN: 11 mg/dL (ref 6–23)
CO2: 28 mEq/L (ref 19–32)
Calcium: 9.1 mg/dL (ref 8.4–10.5)
Chloride: 100 mEq/L (ref 96–112)
Creatinine, Ser: 1 mg/dL (ref 0.40–1.20)
GFR: 71.82 mL/min (ref >60.00)
Glucose, Bld: 85 mg/dL (ref 70–99)
Potassium: 3.9 mEq/L (ref 3.5–5.1)
Sodium: 137 mEq/L (ref 135–145)

## 2019-05-16 LAB — HEPATIC FUNCTION PANEL
ALT: 16 U/L (ref 0–35)
AST: 18 U/L (ref 0–37)
Albumin: 3.9 g/dL (ref 3.5–5.2)
Alkaline Phosphatase: 80 U/L (ref 39–117)
Bilirubin, Direct: 0 mg/dL (ref 0.0–0.3)
Total Bilirubin: 0.4 mg/dL (ref 0.2–1.2)
Total Protein: 8.4 g/dL — ABNORMAL HIGH (ref 6.0–8.3)

## 2019-05-16 LAB — T4, FREE: Free T4: 0.5 ng/dL — ABNORMAL LOW (ref 0.60–1.60)

## 2019-05-16 LAB — VITAMIN D 25 HYDROXY (VIT D DEFICIENCY, FRACTURES): VITD: 15.53 ng/mL — ABNORMAL LOW (ref 30.00–100.00)

## 2019-05-16 LAB — LIPID PANEL
Cholesterol: 222 mg/dL — ABNORMAL HIGH (ref 0–200)
HDL: 71.2 mg/dL (ref >39.00)
LDL Cholesterol: 132 mg/dL — ABNORMAL HIGH (ref 0–99)
NonHDL: 150.49
Total CHOL/HDL Ratio: 3
Triglycerides: 90 mg/dL (ref 0.0–149.0)
VLDL: 18 mg/dL (ref 0.0–40.0)

## 2019-05-16 LAB — VITAMIN B12: Vitamin B-12: 312 pg/mL (ref 211–911)

## 2019-05-16 LAB — IBC PANEL
Iron: 44 ug/dL (ref 42–145)
Saturation Ratios: 11.2 % — ABNORMAL LOW (ref 20.0–50.0)
Transferrin: 281 mg/dL (ref 212.0–360.0)

## 2019-05-16 LAB — HEMOGLOBIN A1C: Hgb A1c MFr Bld: 6.3 % (ref 4.6–6.5)

## 2019-05-16 LAB — TSH: TSH: 72.51 u[IU]/mL — ABNORMAL HIGH (ref 0.35–4.50)

## 2019-05-16 MED ORDER — HYDROCORTISONE (PERIANAL) 2.5 % EX CREA: 1.0000 "application " | TOPICAL_CREAM | Freq: Two times a day (BID) | CUTANEOUS | 0 refills | Status: DC

## 2019-05-16 MED FILL — PROCTOZONE-HC 2.5 % CREA: 2.5 | 14 days supply | Qty: 30 | Fill #0

## 2019-05-16 NOTE — Assessment & Plan Note (Signed)
Ok for colace 100 bid prn,  to f/u any worsening symptoms or concerns

## 2019-05-16 NOTE — Assessment & Plan Note (Signed)
Mild to mod, for anusol HC cream,  to f/u any worsening symptoms or concerns
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
## 2019-05-16 NOTE — Assessment & Plan Note (Signed)
stable overall by history and exam, recent data reviewed with pt, and pt to continue medical treatment as before,  to f/u any worsening symptoms or concerns  

## 2019-05-16 NOTE — Patient Instructions (Signed)
Please take all new medication as prescribed - the hemorrhoid cream  OK to take OTC Colace 100 mg twice per day as needed for constipation  Please continue all other medications as before, and refills have been done if requested.  Please have the pharmacy call with any other refills you may need.  Please keep your appointments with your specialists as you may have planned

## 2019-05-17 LAB — MICROALBUMIN / CREATININE URINE RATIO
Creatinine,U: 145.9 mg/dL
Microalb Creat Ratio: 0.9 mg/g (ref 0.0–30.0)
Microalb, Ur: 1.3 mg/dL (ref 0.0–1.9)

## 2019-05-18 ENCOUNTER — Other Ambulatory Visit: Payer: Self-pay | Admitting: Internal Medicine

## 2019-05-18 ENCOUNTER — Telehealth: Payer: Self-pay

## 2019-05-18 DIAGNOSIS — E039 Hypothyroidism, unspecified: Secondary | ICD-10-CM

## 2019-05-18 MED ORDER — VITAMIN D (ERGOCALCIFEROL) 1.25 MG (50000 UNIT) PO CAPS: 50000.0000 [IU] | ORAL_CAPSULE | ORAL | 0 refills | Status: DC

## 2019-05-18 MED FILL — VIT D2 1.25 MG (50,000 UNIT: 1.25 MG | 84 days supply | Qty: 12 | Fill #0

## 2019-05-18 NOTE — Telephone Encounter (Signed)
-----   Message from Biagio Borg, MD sent at 05/18/2019 12:37 PM EDT ----- Left message on MyChart, pt to cont same tx except  The test results show that your current treatment is OK, except the thyroid testing shows very low thyroid function.  Please remember to take your thyroid medication on a regular basis, and we will refer to Endocrinology for further assistance.  Redmond Baseman to please inform pt, I will do referral  (pt also knows to take her Vit D for low Vit D level)

## 2019-05-18 NOTE — Telephone Encounter (Signed)
Ok this is done 

## 2019-05-18 NOTE — Addendum Note (Signed)
Addended by: Biagio Borg on: 05/18/2019 04:50 PM   Modules accepted: Orders

## 2019-05-18 NOTE — Telephone Encounter (Signed)
Pt has been informed of results and expressed understanding.   However, Pt stated that vit D was not received a the pharmacy. I looked in her chart and it doesn't appears that it was sent in. Please advise.

## 2019-05-19 ENCOUNTER — Encounter: Payer: Self-pay | Admitting: Radiology

## 2019-05-19 ENCOUNTER — Telehealth: Payer: Self-pay | Admitting: Specialist

## 2019-05-19 NOTE — Telephone Encounter (Signed)
Patient called to see if she could get another handicap sticker. She misplaced hers, Also, she needs a MD note to cover her FMLA  Because of her back. She left work because of the pain she was having.  Patient wants to be called to [phone]

## 2019-05-19 NOTE — Telephone Encounter (Signed)
Note has been written for 05/10/2019, and the handicap placard has been completed and mailed to patient

## 2019-05-23 MED FILL — SUMAtriptan SUCCINATE 100 M: 100 | 30 days supply | Qty: 9 | Fill #1

## 2019-05-23 MED FILL — LIDOCAINE PATCH 5%: 5 | 30 days supply | Qty: 30 | Fill #3

## 2019-06-02 ENCOUNTER — Telehealth: Payer: Self-pay | Admitting: Specialist

## 2019-06-02 NOTE — Telephone Encounter (Signed)
Patient called left voicemail message asking if she can be put on light duty because she is still having a lot of pain in her back? ----is this ok, please advise

## 2019-06-02 NOTE — Telephone Encounter (Signed)
Patient called left voicemail message asking if she can be put on light duty because she is still having a lot of pain in her back? The number to contact patient is [phone]

## 2019-06-03 MED FILL — BLOOD PRESSURE MONITOR MISC: 30 days supply | Qty: 1 | Fill #0

## 2019-06-21 ENCOUNTER — Ambulatory Visit (INDEPENDENT_AMBULATORY_CARE_PROVIDER_SITE_OTHER): Payer: 59 | Admitting: Orthopaedic Surgery

## 2019-06-21 ENCOUNTER — Other Ambulatory Visit: Payer: Self-pay

## 2019-06-21 ENCOUNTER — Encounter: Payer: Self-pay | Admitting: Orthopaedic Surgery

## 2019-06-21 DIAGNOSIS — E6609 Other obesity due to excess calories: Secondary | ICD-10-CM

## 2019-06-21 DIAGNOSIS — M48062 Spinal stenosis, lumbar region with neurogenic claudication: Secondary | ICD-10-CM | POA: Diagnosis not present

## 2019-06-21 DIAGNOSIS — M170 Bilateral primary osteoarthritis of knee: Secondary | ICD-10-CM | POA: Diagnosis not present

## 2019-06-21 DIAGNOSIS — G894 Chronic pain syndrome: Secondary | ICD-10-CM

## 2019-06-21 DIAGNOSIS — S46011S Strain of muscle(s) and tendon(s) of the rotator cuff of right shoulder, sequela: Secondary | ICD-10-CM | POA: Diagnosis not present

## 2019-06-21 NOTE — Progress Notes (Signed)
The patient comes in today for [place] to follow-up on her chronic pain as it relates to her having to miss a consider amount of time from work due to her pain.  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  She is on FMLA and does miss a consider amount of work on a weekly basis due to the severity of her pain.  She is morbidly obese with her last recorded weight last month of being 345 pounds.  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  She also has significant lumbar spinal stenosis with neurogenic claudication.  She actually has a follow-up in the near future with my partner Dr. Louanne Skye who is seeing her for her spine.  Her pain flares up quite a bit on a weekly basis and this has had a detrimental effect on her mobility, her quality of life and her activities daily living.  I do feel that it is legitimate and that it causes her to have to miss work on a regular basis due to the severity of her pain.  I have counseled her about this and today examined her to make sure this is legitimate that she does still this a significant amount of work due to the pain as a relates to her shoulder, her back, and her knees.  At this point, I have recommended referral to Dr. Leafy Ro with the bariatric clinic for evaluation treatment of her morbid obesity.  She will continue to follow-up with Dr. Louanne Skye for her lumbar spine.  She will unfortunately continue to need to miss work as a relates to flareups of her chronic pain.  I do feel that she is benefit from a pain management specialist as well and we can make that referral.  I did ask her about the days that she is off because it seems to be according to her FMLA a lot of Mondays, Wednesdays and Fridays but she says it does not for anything specific other than her dealing with her pain.

## 2019-06-23 ENCOUNTER — Ambulatory Visit (INDEPENDENT_AMBULATORY_CARE_PROVIDER_SITE_OTHER): Payer: 59 | Admitting: Internal Medicine

## 2019-06-23 ENCOUNTER — Encounter: Payer: Self-pay | Admitting: Internal Medicine

## 2019-06-23 DIAGNOSIS — J302 Other seasonal allergic rhinitis: Secondary | ICD-10-CM | POA: Diagnosis not present

## 2019-06-23 DIAGNOSIS — J069 Acute upper respiratory infection, unspecified: Secondary | ICD-10-CM | POA: Diagnosis not present

## 2019-06-23 DIAGNOSIS — J3089 Other allergic rhinitis: Secondary | ICD-10-CM

## 2019-06-23 DIAGNOSIS — J4531 Mild persistent asthma with (acute) exacerbation: Secondary | ICD-10-CM

## 2019-06-23 DIAGNOSIS — R739 Hyperglycemia, unspecified: Secondary | ICD-10-CM

## 2019-06-23 MED ORDER — AZITHROMYCIN 250 MG PO TABS: ORAL_TABLET | ORAL | 1 refills | Status: DC

## 2019-06-23 MED ORDER — ALBUTEROL SULFATE HFA 108 (90 BASE) MCG/ACT IN AERS: 2.0000 | INHALATION_SPRAY | Freq: Four times a day (QID) | RESPIRATORY_TRACT | 5 refills | Status: DC | PRN

## 2019-06-23 MED ORDER — CETIRIZINE HCL 10 MG PO TABS: 10.0000 mg | ORAL_TABLET | Freq: Every day | ORAL | 11 refills | Status: DC

## 2019-06-23 MED ORDER — HYDROCODONE-HOMATROPINE 5-1.5 MG/5ML PO SYRP: 5.0000 mL | ORAL_SOLUTION | Freq: Four times a day (QID) | ORAL | 0 refills | Status: AC | PRN

## 2019-06-23 MED ORDER — TRIAMCINOLONE ACETONIDE 55 MCG/ACT NA AERO: 2.0000 | INHALATION_SPRAY | Freq: Every day | NASAL | 12 refills | Status: DC

## 2019-06-23 MED FILL — CETIRIZINE HCL 10 MG TABS: 10 | 30 days supply | Qty: 30 | Fill #0

## 2019-06-23 MED FILL — ALBUTEROL SULFATE HFA 108 (: 108 (90 BAS | 25 days supply | Qty: 18 | Fill #0

## 2019-06-23 MED FILL — ACETAMINOPHEN/COD #4 TABLET: 300-60 | 5 days supply | Qty: 30 | Fill #0

## 2019-06-23 MED FILL — AZITHROMYCIN 250 MG TABLET: 250 | 5 days supply | Qty: 6 | Fill #0

## 2019-06-23 MED FILL — HYDROCODONE-HOMATROPINE SOL: 5-1.5 | 10 days supply | Qty: 180 | Fill #0
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
## 2019-06-23 NOTE — Assessment & Plan Note (Signed)
To re-start the flonase asd,  to f/u any worsening symptoms or concerns  

## 2019-06-23 NOTE — Assessment & Plan Note (Signed)
Mild to mod, for antibx course,  to f/u any worsening symptoms or concerns 

## 2019-06-23 NOTE — Patient Instructions (Signed)
Please take all new medication as prescribed  Please continue all other medications as before, and refills have been done if requested.  Please have the pharmacy call with any other refills you may need.  Please continue your efforts at being more active, low cholesterol diet, and weight control.  Please keep your appointments with your specialists as you may have planned    

## 2019-06-23 NOTE — Assessment & Plan Note (Signed)
stable overall by history and exam, recent data reviewed with pt, and pt to continue medical treatment as before,  to f/u any worsening symptoms or concerns  

## 2019-06-23 NOTE — Assessment & Plan Note (Signed)
Ok for [employer] 2 days per month if needed, for albuterol HFA prn,  to f/u any worsening symptoms or concerns

## 2019-06-27 MED FILL — OMRON 3 SERIES BP MONITOR D: 30 days supply | Qty: 1 | Fill #0

## 2019-07-01 ENCOUNTER — Telehealth: Payer: Self-pay | Admitting: Orthopaedic Surgery

## 2019-07-01 MED ORDER — LIDOCAINE 5 % EX PTCH: 1.0000 | MEDICATED_PATCH | Freq: Every day | CUTANEOUS | 3 refills | Status: DC | PRN

## 2019-07-01 MED ORDER — TRAMADOL HCL 50 MG PO TABS: 50.0000 mg | ORAL_TABLET | Freq: Four times a day (QID) | ORAL | 0 refills | Status: DC | PRN

## 2019-07-01 MED FILL — traMADol HCL 50 MG TABS: 50 | 8 days supply | Qty: 60 | Fill #0

## 2019-07-01 NOTE — Telephone Encounter (Signed)
Please advise 

## 2019-07-01 NOTE — Telephone Encounter (Signed)
I called patient and advised. She requests refill on her lidocaine patches as well. Please advise.

## 2019-07-01 NOTE — Telephone Encounter (Signed)
I had called patient regarding tramadol being called in per previous message. We discussed lidocaine patches. She requests refill. Request has already been sent to Dr. Ninfa [place].

## 2019-07-01 NOTE — Telephone Encounter (Signed)
Patient called stating she fell 06/28/2019. Patient asked if Dr Ninfa [place] or Artis Delay will prescribe something for pain. Patient said her back and right shoulder is hurting her. The number to contact patient is [phone]

## 2019-07-01 NOTE — Telephone Encounter (Signed)
I sent in some Tramadol  

## 2019-07-01 NOTE — Telephone Encounter (Signed)
Patient called asked if there is a stronger Lidocaine patch she can use to help with the pain. Please see previous message  The number to contact patient is [phone]

## 2019-07-02 ENCOUNTER — Other Ambulatory Visit (HOSPITAL_COMMUNITY)
Admission: RE | Admit: 2019-07-02 | Discharge: 2019-07-02 | Disposition: A | Discharge status: Home / Self Care | Payer: 59 | Source: Ambulatory Visit | Attending: Surgery | Admitting: Surgery

## 2019-07-02 DIAGNOSIS — Z01812 Encounter for preprocedural laboratory examination: Secondary | ICD-10-CM | POA: Insufficient documentation

## 2019-07-02 DIAGNOSIS — Z20828 Contact with and (suspected) exposure to other viral communicable diseases: Secondary | ICD-10-CM | POA: Diagnosis not present

## 2019-07-02 LAB — SARS CORONAVIRUS 2 (TAT 6-24 HRS): SARS Coronavirus 2: NEGATIVE

## 2019-07-04 ENCOUNTER — Encounter (HOSPITAL_COMMUNITY): Payer: Self-pay | Admitting: *Deleted

## 2019-07-04 ENCOUNTER — Other Ambulatory Visit: Payer: Self-pay

## 2019-07-04 MED FILL — LIDOCAINE PATCH 5%: 5 | 30 days supply | Qty: 30 | Fill #0
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
## 2019-07-04 NOTE — Progress Notes (Signed)
Spoke with pt for pre-op call. Pt states she has a heart murmur that she has had since she was a child, no problems with it. Pt states she is not diabetic.   Pt had her Covid test done on Saturday, 07/02/19. 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 I spoke with Dr. Jefm Petty, Anesthesiologist about this and he has ok'ed pt to come in for procedure. I called pt back and told her that when she gets off work, she needs to go home and stay home until tomorrow AM. I told pt if she could leave work early, that would be good, but I did not tell her she had to leave work.

## 2019-07-05 ENCOUNTER — Ambulatory Visit (HOSPITAL_COMMUNITY): Payer: 59 | Admitting: Certified Registered Nurse Anesthetist

## 2019-07-05 ENCOUNTER — Encounter (HOSPITAL_COMMUNITY): Admission: RE | Disposition: A | Discharge status: Home / Self Care | Payer: Self-pay | Source: Home / Self Care

## 2019-07-05 ENCOUNTER — Ambulatory Visit (HOSPITAL_COMMUNITY)
Admission: RE | Admit: 2019-07-05 | Discharge: 2019-07-05 | Disposition: A | Discharge status: Home / Self Care | Payer: 59 | Source: Ambulatory Visit | Attending: Surgery | Admitting: Surgery

## 2019-07-05 ENCOUNTER — Encounter (HOSPITAL_COMMUNITY): Payer: Self-pay

## 2019-07-05 ENCOUNTER — Ambulatory Visit (HOSPITAL_COMMUNITY)
Admission: RE | Admit: 2019-07-05 | Discharge: 2019-07-05 | Disposition: A | Discharge status: Home / Self Care | Payer: 59 | Attending: Anesthesiology | Admitting: Anesthesiology

## 2019-07-05 ENCOUNTER — Other Ambulatory Visit: Payer: Self-pay

## 2019-07-05 DIAGNOSIS — J45909 Unspecified asthma, uncomplicated: Secondary | ICD-10-CM | POA: Insufficient documentation

## 2019-07-05 DIAGNOSIS — M51369 Other intervertebral disc degeneration, lumbar region without mention of lumbar back pain or lower extremity pain: Secondary | ICD-10-CM

## 2019-07-05 DIAGNOSIS — G4730 Sleep apnea, unspecified: Secondary | ICD-10-CM | POA: Insufficient documentation

## 2019-07-05 DIAGNOSIS — M4316 Spondylolisthesis, lumbar region: Secondary | ICD-10-CM | POA: Diagnosis not present

## 2019-07-05 DIAGNOSIS — R51 Headache: Secondary | ICD-10-CM | POA: Diagnosis not present

## 2019-07-05 DIAGNOSIS — E039 Hypothyroidism, unspecified: Secondary | ICD-10-CM | POA: Diagnosis not present

## 2019-07-05 DIAGNOSIS — J45901 Unspecified asthma with (acute) exacerbation: Secondary | ICD-10-CM | POA: Diagnosis not present

## 2019-07-05 DIAGNOSIS — M1990 Unspecified osteoarthritis, unspecified site: Secondary | ICD-10-CM | POA: Insufficient documentation

## 2019-07-05 DIAGNOSIS — M5416 Radiculopathy, lumbar region: Secondary | ICD-10-CM | POA: Diagnosis not present

## 2019-07-05 DIAGNOSIS — I10 Essential (primary) hypertension: Secondary | ICD-10-CM | POA: Insufficient documentation

## 2019-07-05 DIAGNOSIS — M48061 Spinal stenosis, lumbar region without neurogenic claudication: Secondary | ICD-10-CM | POA: Insufficient documentation

## 2019-07-05 DIAGNOSIS — M5136 Other intervertebral disc degeneration, lumbar region: Secondary | ICD-10-CM

## 2019-07-05 DIAGNOSIS — M5127 Other intervertebral disc displacement, lumbosacral region: Secondary | ICD-10-CM | POA: Diagnosis not present

## 2019-07-05 DIAGNOSIS — F419 Anxiety disorder, unspecified: Secondary | ICD-10-CM | POA: Insufficient documentation

## 2019-07-05 DIAGNOSIS — M4317 Spondylolisthesis, lumbosacral region: Secondary | ICD-10-CM

## 2019-07-05 DIAGNOSIS — R011 Cardiac murmur, unspecified: Secondary | ICD-10-CM | POA: Insufficient documentation

## 2019-07-05 HISTORY — PX: RADIOLOGY WITH ANESTHESIA: SHX6223

## 2019-07-05 LAB — CBC
HCT: 39.6 % (ref 36.0–46.0)
Hemoglobin: 12.6 g/dL (ref 12.0–15.0)
MCH: 29 pg (ref 26.0–34.0)
MCHC: 31.8 g/dL (ref 30.0–36.0)
MCV: 91.2 fL (ref 80.0–100.0)
Platelets: 255 10*3/uL (ref 150–400)
RBC: 4.34 MIL/uL (ref 3.87–5.11)
RDW: 17.2 % — ABNORMAL HIGH (ref 11.5–15.5)
WBC: 7.6 10*3/uL (ref 4.0–10.5)
nRBC: 0 % (ref 0.0–0.2)

## 2019-07-05 LAB — BASIC METABOLIC PANEL
Anion gap: 6 (ref 5–15)
BUN: 14 mg/dL (ref 6–20)
CO2: 26 mmol/L (ref 22–32)
Calcium: 9 mg/dL (ref 8.9–10.3)
Chloride: 105 mmol/L (ref 98–111)
Creatinine, Ser: 0.99 mg/dL (ref 0.44–1.00)
GFR calc Af Amer: >60 mL/min (ref >60)
GFR calc non Af Amer: >60 mL/min (ref >60)
Glucose, Bld: 111 mg/dL — ABNORMAL HIGH (ref 70–99)
Potassium: 4.2 mmol/L (ref 3.5–5.1)
Sodium: 137 mmol/L (ref 135–145)

## 2019-07-05 LAB — POCT PREGNANCY, URINE: Preg Test, Ur: NEGATIVE

## 2019-07-05 SURGERY — MRI WITH ANESTHESIA
Anesthesia: General

## 2019-07-05 MED ORDER — LACTATED RINGERS IV SOLN: INTRAVENOUS | Status: DC

## 2019-07-05 MED ORDER — PROMETHAZINE HCL 25 MG/ML IJ SOLN: 6.2500 mg | INTRAMUSCULAR | Status: DC | PRN

## 2019-07-05 NOTE — Transfer of Care (Signed)
Immediate Anesthesia Transfer of Care Note  Patient: Vanessa [name]  Procedure(s) Performed: MRI WITH ANESTHESIA LUMBAR SPINE WITHOUT (N/A )  Patient Location: PACU  Anesthesia Type:General  Level of Consciousness: awake  Airway & Oxygen Therapy: Patient connected to face mask oxygen  Post-op Assessment: Report given to RN and Post -op Vital signs reviewed and stable  Post vital signs: Reviewed  Last Vitals:  Vitals Value Taken Time  BP 127/76 07/05/19 0940  Temp    Pulse 75 07/05/19 0942  Resp 18 07/05/19 0942  SpO2 97 % 07/05/19 0942  Vitals shown include unvalidated device data.  Last Pain:  Vitals:   07/05/19 0719  TempSrc:   PainSc: 8       Patients Stated Pain Goal: 3 (85/63/14 9702)  Complications: No apparent anesthesia complications

## 2019-07-05 NOTE — Anesthesia Procedure Notes (Signed)
Procedure Name: Intubation Date/Time: 07/05/2019 8:23 AM Performed by: Janene Harvey, CRNA Pre-anesthesia Checklist: Patient identified, Emergency Drugs available, Suction available and Patient being monitored Patient Re-evaluated:Patient Re-evaluated prior to induction Oxygen Delivery Method: Circle system utilized Preoxygenation: Pre-oxygenation with 100% oxygen Induction Type: IV induction Laryngoscope Size: Glidescope and 3 Grade View: Grade I Tube type: Oral Tube size: 7.0 mm Number of attempts: 1 Airway Equipment and Method: Stylet and Oral airway Placement Confirmation: ETT inserted through vocal cords under direct vision,  positive ETCO2 and breath sounds checked- equal and bilateral Secured at: 22 cm Tube secured with: Tape Dental Injury: Teeth and Oropharynx as per pre-operative assessment  Difficulty Due To: Difficulty was anticipated, Difficult Airway- due to large tongue, Difficult Airway- due to reduced neck mobility, Difficult Airway-  due to edematous airway and Difficult Airway- due to limited oral opening Comments: See comments on MRI paper anesthesia record

## 2019-07-06 ENCOUNTER — Encounter (HOSPITAL_COMMUNITY): Payer: Self-pay | Admitting: Radiology

## 2019-07-06 MED FILL — Lidocaine HCl Local Soln Prefilled Syringe 100 MG/5ML (2%): INTRAMUSCULAR | Qty: 5 | Status: AC

## 2019-07-06 MED FILL — Fentanyl Citrate Preservative Free (PF) Inj 100 MCG/2ML: INTRAMUSCULAR | Qty: 2 | Status: AC

## 2019-07-06 MED FILL — Ephedrine Sulf-NaCl Soln Pref Syr 50 MG/10ML-0.9% (5 MG/ML): INTRAVENOUS | Qty: 10 | Status: AC

## 2019-07-06 MED FILL — Phenylephrine-NaCl Pref Syr 0.4 MG/10ML-0.9% (40 MCG/ML): INTRAVENOUS | Qty: 10 | Status: AC

## 2019-07-06 MED FILL — Midazolam HCl Inj 2 MG/2ML (Base Equivalent): INTRAMUSCULAR | Qty: 2 | Status: AC

## 2019-07-06 MED FILL — Succinylcholine Chloride Sol Pref Syr 200 MG/10ML (20 MG/ML): INTRAVENOUS | Qty: 10 | Status: AC

## 2019-07-06 MED FILL — Ondansetron HCl Inj 4 MG/2ML (2 MG/ML): INTRAMUSCULAR | Qty: 2 | Status: AC

## 2019-07-06 MED FILL — Dexamethasone Sodium Phosphate Inj 10 MG/ML: INTRAMUSCULAR | Qty: 1 | Status: AC

## 2019-07-06 MED FILL — Propofol IV Emul 200 MG/20ML (10 MG/ML): INTRAVENOUS | Qty: 40 | Status: AC

## 2019-07-08 ENCOUNTER — Other Ambulatory Visit: Payer: Self-pay | Admitting: Internal Medicine

## 2019-07-08 ENCOUNTER — Ambulatory Visit: Payer: 59 | Admitting: Specialist

## 2019-07-08 NOTE — Telephone Encounter (Signed)
Medication Refill - Medication: azithromycin (ZITHROMAX Z-PAK) 250 MG tablet   Has the patient contacted their pharmacy? No. Pt had a flare up and states she feels horrible. Please advise.  (Agent: If no, request that the patient contact the pharmacy for the refill.) (Agent: If yes, when and what did the pharmacy advise?)  Preferred Pharmacy (with phone number or street name):  [facility], [place].  [address] [address]  Phone: [phone] Fax: [phone]  Not a 24 hour pharmacy; exact hours not known.     Agent: Please be advised that RX refills may take up to 3 business days. We ask that you follow-up with your pharmacy.

## 2019-07-08 NOTE — Telephone Encounter (Signed)
Requested medication (s) are due for refill today:no  Requested medication (s) are on the active medication list:yes  Last refill:  06/05/2019  Future visit scheduled: no  Notes to clinic:  Pt had a flare up and states she feels horrible. Please advise.   Requested Prescriptions  Pending Prescriptions Disp Refills   azithromycin (ZITHROMAX Z-PAK) 250 MG tablet 6 tablet 1    Sig: 2 tab by mouth day 1, then 1 per day     Off-Protocol Failed - 07/08/2019  9:08 AM      Failed - Medication not assigned to a protocol, review manually.      Passed - Valid encounter within last 12 months    Recent Outpatient Visits          2 weeks ago Acute upper respiratory infection   [facility] -Georges Mouse, MD   1 month ago External hemorrhoid   [facility] John, James W, MD   3 months ago Encounter for well adult exam with abnormal findings   [facility] John, James W, MD   1 year ago Encounter for well adult exam with abnormal findings   [facility] John, James W, MD   1 year ago Cough   [facility] -Georges Mouse, MD      Future Appointments            In 1 month Louanne Skye, Daleen Bo, MD [facility]   In 3 months Jenny Reichmann, Hunt Oris, MD [place], [facility]

## 2019-07-08 NOTE — Telephone Encounter (Signed)
This is unlikely to help if still having problems; note COVID neg 8/29 but our office will not allow a sick person to be seen here  Please consider UC or ED to r/o pna

## 2019-07-08 NOTE — Telephone Encounter (Signed)
Attempted to call pt, VM is not set up for msgs.   

## 2019-07-19 ENCOUNTER — Telehealth: Payer: Self-pay

## 2019-07-19 ENCOUNTER — Telehealth: Payer: Self-pay | Admitting: Internal Medicine

## 2019-07-19 DIAGNOSIS — Z0279 Encounter for issue of other medical certificate: Secondary | ICD-10-CM

## 2019-07-19 MED FILL — LIDOCAINE PATCH 5%: 5 | 30 days supply | Qty: 30 | Fill #0

## 2019-07-19 MED FILL — traMADol HCL 50 MG TABS: 50 | 8 days supply | Qty: 60 | Fill #0

## 2019-07-19 NOTE — Telephone Encounter (Signed)
I have received FMLA via fax from [employer]. Patient had OV on 06/23/19. 2 days a month for [place] has been approved.   Forms have been completed & placed in providers box to review and sign.

## 2019-07-19 NOTE — Telephone Encounter (Signed)
Copied from [place] [id number]. Topic: General - Other >> Jul 19, 2019  1:55 PM [name], Vanessa R wrote: Reason for CRM: Pt requesting a note excusing her from getting the flu shot bc she is allergic.  Please send to health at work, or place in her [place].

## 2019-07-19 NOTE — Telephone Encounter (Signed)
Forms have been signed, Faxed to [employer], Copy sent to [employer] &Charged for.   Patient's VM has not been set up. Original  has been mailed to patient for her records.

## 2019-07-20 NOTE — Telephone Encounter (Addendum)
Pt has been informed letter has be completed.   Mailed out to pt per pt request.

## 2019-07-20 NOTE — Telephone Encounter (Signed)
[place] for letter to pt , ok for shirron to do

## 2019-07-28 ENCOUNTER — Ambulatory Visit: Payer: 59 | Admitting: Internal Medicine

## 2019-07-28 DIAGNOSIS — Z0289 Encounter for other administrative examinations: Secondary | ICD-10-CM

## 2019-07-29 ENCOUNTER — Other Ambulatory Visit: Payer: Self-pay | Admitting: Internal Medicine

## 2019-07-29 ENCOUNTER — Other Ambulatory Visit (INDEPENDENT_AMBULATORY_CARE_PROVIDER_SITE_OTHER): Payer: Self-pay | Admitting: Physician Assistant

## 2019-07-29 MED FILL — NAPROXEN 500 MG TABLET: 500 | 30 days supply | Qty: 60 | Fill #0

## 2019-07-29 MED FILL — HYDROCHLOROTHIAZIDE 25 MG T: 25 | 90 days supply | Qty: 90 | Fill #0

## 2019-07-29 MED FILL — LEVOTHYROXINE 200 MCG TAB: 200 | 90 days supply | Qty: 90 | Fill #0

## 2019-08-02 ENCOUNTER — Ambulatory Visit: Payer: 59 | Admitting: Internal Medicine

## 2019-08-02 DIAGNOSIS — Z0289 Encounter for other administrative examinations: Secondary | ICD-10-CM
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
## 2019-08-08 ENCOUNTER — Encounter: Payer: Self-pay | Admitting: Specialist

## 2019-08-08 ENCOUNTER — Other Ambulatory Visit: Payer: Self-pay

## 2019-08-08 ENCOUNTER — Ambulatory Visit (INDEPENDENT_AMBULATORY_CARE_PROVIDER_SITE_OTHER): Payer: 59 | Admitting: Specialist

## 2019-08-08 VITALS — BP 139/83 | HR 77 | Ht 65.0 in | Wt 345.0 lb

## 2019-08-08 DIAGNOSIS — M48062 Spinal stenosis, lumbar region with neurogenic claudication: Secondary | ICD-10-CM | POA: Diagnosis not present

## 2019-08-08 DIAGNOSIS — Z6841 Body Mass Index (BMI) 40.0 and over, adult: Secondary | ICD-10-CM

## 2019-08-08 DIAGNOSIS — M4316 Spondylolisthesis, lumbar region: Secondary | ICD-10-CM | POA: Diagnosis not present

## 2019-08-08 DIAGNOSIS — M5136 Other intervertebral disc degeneration, lumbar region: Secondary | ICD-10-CM

## 2019-08-08 MED ORDER — CELECOXIB 200 MG PO CAPS: 200.0000 mg | ORAL_CAPSULE | Freq: Two times a day (BID) | ORAL | 3 refills | Status: DC

## 2019-08-08 MED FILL — CELECOXIB 200 MG CAP: 200 | 90 days supply | Qty: 180 | Fill #0

## 2019-08-08 NOTE — Patient Instructions (Signed)
Avoid bending, stooping and avoid lifting weights greater than 10 lbs. Avoid prolong standing and walking. Avoid frequent bending and stooping  No lifting greater than 10 lbs. May use ice or moist heat for pain. Weight loss is of benefit. Handicap license is approved. If you wish to undergo an epidural steroid injection please call and we would  Have Dr. Romona Curls secretary/Assistant call you to arrange for epidural steroid injection.
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
## 2019-08-15 ENCOUNTER — Encounter: Payer: Self-pay | Admitting: Neurology

## 2019-08-15 ENCOUNTER — Other Ambulatory Visit: Payer: Self-pay

## 2019-08-15 DIAGNOSIS — R202 Paresthesia of skin: Secondary | ICD-10-CM

## 2019-08-16 ENCOUNTER — Ambulatory Visit: Payer: 59 | Admitting: Internal Medicine

## 2019-08-16 DIAGNOSIS — Z0289 Encounter for other administrative examinations: Secondary | ICD-10-CM
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
## 2019-09-05 ENCOUNTER — Telehealth: Payer: Self-pay | Admitting: Orthopaedic Surgery

## 2019-09-05 ENCOUNTER — Ambulatory Visit: Payer: 59 | Admitting: Orthopaedic Surgery

## 2019-09-05 NOTE — Telephone Encounter (Signed)
Patient called advised she is no longer working because she was let go from her job and had to cancel her appointment today. Patient asked if a letter can be written so that she can get long term disability. Patient said she is having a lot of pain in her back. Patient said her shoulders,knees, hip is also hurting her. The number to contact patient is [phone]

## 2019-09-15 ENCOUNTER — Encounter: Payer: Self-pay | Admitting: Neurology

## 2019-09-19 NOTE — Telephone Encounter (Signed)
Let her know that I have to sit down at some point and dictate a very long letter that deals with disability.  This is a big deal because we have to make sure all our fax are covered in order to make sure patient qualifies for full body disability and long-term disability.  The physician who does this has to make sure it is done appropriately because it is considered fraudulent to recommend disability it is not needed.  I do feel that she qualifies for disability but I have to have the time to sit down and do a long letter which also involves the chart review.  Please send this message back to me after you speak with her and let her know that I will try to get it done within the next week.

## 2019-09-19 NOTE — Telephone Encounter (Signed)
See below

## 2019-09-19 NOTE — Telephone Encounter (Signed)
Patient called back stating that she is still waiting on Dr. Trevor Mace response in regards the long term disability.  BX#[phone].  Thank you.

## 2019-09-20 ENCOUNTER — Other Ambulatory Visit: Payer: Self-pay

## 2019-09-20 NOTE — Telephone Encounter (Signed)
Patient aware.

## 2019-09-22 ENCOUNTER — Ambulatory Visit: Payer: 59 | Admitting: Internal Medicine
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
## 2019-09-27 ENCOUNTER — Other Ambulatory Visit: Payer: Self-pay

## 2019-09-28 ENCOUNTER — Ambulatory Visit: Payer: Self-pay | Admitting: Internal Medicine
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
## 2019-10-06 ENCOUNTER — Ambulatory Visit: Payer: 59 | Admitting: Internal Medicine

## 2019-10-19 ENCOUNTER — Ambulatory Visit: Payer: Self-pay | Admitting: *Deleted

## 2019-10-19 ENCOUNTER — Ambulatory Visit (INDEPENDENT_AMBULATORY_CARE_PROVIDER_SITE_OTHER): Payer: Self-pay | Admitting: Internal Medicine

## 2019-10-19 DIAGNOSIS — R739 Hyperglycemia, unspecified: Secondary | ICD-10-CM

## 2019-10-19 DIAGNOSIS — J302 Other seasonal allergic rhinitis: Secondary | ICD-10-CM

## 2019-10-19 DIAGNOSIS — J4531 Mild persistent asthma with (acute) exacerbation: Secondary | ICD-10-CM

## 2019-10-19 DIAGNOSIS — E039 Hypothyroidism, unspecified: Secondary | ICD-10-CM

## 2019-10-19 DIAGNOSIS — I10 Essential (primary) hypertension: Secondary | ICD-10-CM

## 2019-10-19 DIAGNOSIS — J3089 Other allergic rhinitis: Secondary | ICD-10-CM

## 2019-10-19 DIAGNOSIS — E559 Vitamin D deficiency, unspecified: Secondary | ICD-10-CM | POA: Insufficient documentation

## 2019-10-19 DIAGNOSIS — L309 Dermatitis, unspecified: Secondary | ICD-10-CM

## 2019-10-19 MED ORDER — FLUTICASONE PROPIONATE 0.05 % EX CREA: TOPICAL_CREAM | CUTANEOUS | 1 refills | Status: DC

## 2019-10-19 MED ORDER — LOSARTAN POTASSIUM 100 MG PO TABS: 100.0000 mg | ORAL_TABLET | Freq: Every day | ORAL | 3 refills | Status: DC

## 2019-10-19 MED ORDER — GABAPENTIN 300 MG PO CAPS: 300.0000 mg | ORAL_CAPSULE | Freq: Two times a day (BID) | ORAL | 1 refills | Status: DC

## 2019-10-19 MED ORDER — DICLOFENAC SODIUM 50 MG PO TBEC: 50.0000 mg | DELAYED_RELEASE_TABLET | Freq: Three times a day (TID) | ORAL | 5 refills | Status: DC

## 2019-10-19 MED ORDER — TRIAMCINOLONE ACETONIDE 55 MCG/ACT NA AERO: 2.0000 | INHALATION_SPRAY | Freq: Every day | NASAL | 12 refills | Status: DC

## 2019-10-19 MED ORDER — LEVOTHYROXINE SODIUM 200 MCG PO TABS: ORAL_TABLET | ORAL | 3 refills | Status: DC

## 2019-10-19 MED ORDER — ALBUTEROL SULFATE HFA 108 (90 BASE) MCG/ACT IN AERS: 2.0000 | INHALATION_SPRAY | Freq: Four times a day (QID) | RESPIRATORY_TRACT | 11 refills | Status: DC | PRN

## 2019-10-19 MED ORDER — HYDROCHLOROTHIAZIDE 25 MG PO TABS: 25.0000 mg | ORAL_TABLET | Freq: Every day | ORAL | 3 refills | Status: DC

## 2019-10-19 MED ORDER — QVAR 80 MCG/ACT IN AERS: INHALATION_SPRAY | RESPIRATORY_TRACT | 11 refills | Status: DC

## 2019-10-19 MED ORDER — CELECOXIB 200 MG PO CAPS: 200.0000 mg | ORAL_CAPSULE | Freq: Two times a day (BID) | ORAL | 3 refills | Status: DC

## 2019-10-19 MED ORDER — PREDNISONE 10 MG PO TABS: ORAL_TABLET | ORAL | 0 refills | Status: DC
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
## 2019-10-19 NOTE — Telephone Encounter (Signed)
Pt wants to know if it is okay for her to take OTC Mucinex; she is having congestion, cough and stomach ache, migraines on 10/18/2019; her cold symptoms continue today; the pt says she has asthma; explained that a recommendation can not be given for her to take this medication; further explained based on her history, and symptoms, the evaluation/ and recommendations should come from her provider; she verbalized understanding; she sees Dr Cathlean Cower, [place]; pt transferred to [facility] for scheduling.   Reason for Disposition . [1] Caller has medication question about med not prescribed by PCP AND [2] triager unable to answer question (e.g., compatibility with other med, storage)  Answer Assessment - Initial Assessment Questions 1.   NAME of MEDICATION: "What medicine are you calling about?"     Mucinex OTC 2.   QUESTION: "What is your question?"     Can I take this medicine 3.   PRESCRIBING HCP: "Who prescribed it?" Reason: if prescribed by specialist, call should be referred to that group.     *OTC 4. SYMPTOMS: "Do you have any symptoms?"     Congestion, cough, stomach ache 5. SEVERITY: If symptoms are present, ask "Are they mild, moderate or severe?"     6.  PREGNANCY:  "Is there any chance that you are pregnant?" "When was your last menstrual period?"  Protocols used: MEDICATION QUESTION CALL-A-AH

## 2019-10-19 NOTE — Telephone Encounter (Signed)
Pt has doxy visit today to discuss.

## 2019-10-21 ENCOUNTER — Telehealth: Payer: Self-pay | Admitting: Internal Medicine

## 2019-10-21 NOTE — Telephone Encounter (Signed)
Ok for celebrex only

## 2019-10-21 NOTE — Telephone Encounter (Signed)
The pharmacy called to clarify which rx pt should be taking as an anti inflammatory. Stated both celecoxib (CELEBREX) 200 MG capsule and diclofenac (VOLTAREN) 50 MG EC tablet  were sent in on 10/19/19. Please advise.   [facility] [phone] - [place], [place] AT [address] Phone:  [phone]  Fax:  [phone]

## 2019-10-21 NOTE — Telephone Encounter (Signed)
Pharmacist has been informed

## 2019-10-22 ENCOUNTER — Encounter: Payer: Self-pay | Admitting: Internal Medicine

## 2019-10-22 NOTE — Assessment & Plan Note (Signed)
With mild seasonal flare, for predpac asd,  to f/u any worsening symptoms or concerns

## 2019-10-22 NOTE — Assessment & Plan Note (Signed)
stable overall by history and exam, recent data reviewed with pt, and pt to continue medical treatment as before,  to f/u any worsening symptoms or concerns  

## 2019-10-22 NOTE — Assessment & Plan Note (Signed)
stable overall by history and exam, recent data reviewed with pt, and pt to continue medical treatment as before,  to f/u any worsening symptoms or concerns, encouraged to f/u. bp at home and next visit

## 2019-10-22 NOTE — Assessment & Plan Note (Signed)
stable overall by history and exam, recent data reviewed with pt, and pt to continue medical treatment as before,  to f/u any worsening symptoms or concerns, for f/u lab 

## 2019-10-22 NOTE — Assessment & Plan Note (Signed)
For f/u lab as well, cont oral replacement

## 2019-10-22 NOTE — Patient Instructions (Signed)
Please take all new medication as prescribed - the prednisone  Please continue all other medications as before, and refills have been done if requested.  Please have the pharmacy call with any other refills you may need.  Please continue your efforts at being more active, low cholesterol diet, and weight control.  Please keep your appointments with your specialists as you may have planned  Please go to the LAB in the Basement (turn left off the elevator) for the tests to be done   You will be contacted by phone if any changes need to be made immediately.  Otherwise, you will receive a letter about your results with an explanation, but please check with MyChart first.  Please remember to sign up for MyChart if you have not done so, as this will be important to you in the future with finding out test results, communicating by private email, and scheduling acute appointments online when needed.

## 2019-10-22 NOTE — Assessment & Plan Note (Signed)
witih mild worsening, for steroid cream asd prn,  to f/u any worsening symptoms or concerns  Note:  Total time for pt hx, exam, review of record with pt in the room, determination of diagnoses and plan for further eval and tx is > 40 min, with over 50% spent in coordination and counseling of patient including the differential dx, tx, further evaluation and other management of eczema, HTN, allergy, asthma, vit d def, hypothryoidism, hyperglycemia

## 2019-11-24 ENCOUNTER — Telehealth: Payer: Self-pay | Admitting: Orthopaedic Surgery

## 2019-11-24 NOTE — Telephone Encounter (Signed)
There is a disability letter that I dictated late last year that is in her chart under the letters section.

## 2019-11-24 NOTE — Telephone Encounter (Signed)
Patient called.   She is requesting a proof of disability document be written for her. There was one made for her in October that will suffice if it is readily available   Call back number: [phone]

## 2019-11-25 NOTE — Telephone Encounter (Signed)
Per patient mail to her home

## 2019-12-07 ENCOUNTER — Telehealth: Payer: Self-pay | Admitting: Specialist

## 2019-12-07 NOTE — Telephone Encounter (Signed)
Pt called wondering if there was anything Dr. Otelia Sergeant could say on her behalf when she files for medicaid?   [phone]

## 2019-12-08 ENCOUNTER — Other Ambulatory Visit: Payer: Self-pay | Admitting: Specialist

## 2019-12-08 NOTE — Telephone Encounter (Signed)
See below

## 2020-01-09 ENCOUNTER — Other Ambulatory Visit (INDEPENDENT_AMBULATORY_CARE_PROVIDER_SITE_OTHER): Payer: Self-pay

## 2020-01-09 DIAGNOSIS — E559 Vitamin D deficiency, unspecified: Secondary | ICD-10-CM

## 2020-01-09 DIAGNOSIS — E039 Hypothyroidism, unspecified: Secondary | ICD-10-CM

## 2020-01-09 LAB — VITAMIN D 25 HYDROXY (VIT D DEFICIENCY, FRACTURES): VITD: 14.78 ng/mL — ABNORMAL LOW (ref 30.00–100.00)

## 2020-01-09 LAB — T4, FREE: Free T4: 0.73 ng/dL (ref 0.60–1.60)

## 2020-01-10 ENCOUNTER — Other Ambulatory Visit: Payer: Self-pay | Admitting: Internal Medicine

## 2020-01-10 MED ORDER — VITAMIN D (ERGOCALCIFEROL) 1.25 MG (50000 UNIT) PO CAPS: 50000.0000 [IU] | ORAL_CAPSULE | ORAL | 0 refills | Status: DC

## 2020-01-10 MED FILL — VIT D2 1.25 MG (50,000 UNIT: 1.25 MG | 84 days supply | Qty: 12 | Fill #0

## 2020-01-13 ENCOUNTER — Telehealth: Payer: Self-pay | Admitting: Internal Medicine

## 2020-01-13 NOTE — Telephone Encounter (Signed)
    Please call patient to discuss lab results 

## 2020-01-17 MED ORDER — VITAMIN D (ERGOCALCIFEROL) 1.25 MG (50000 UNIT) PO CAPS: 50000.0000 [IU] | ORAL_CAPSULE | ORAL | 0 refills | Status: DC

## 2020-01-17 NOTE — Telephone Encounter (Signed)
Callwed pt concerning labs. Pt states she was not able to pull of lab results. Gave her MD response on labs. Also inform her MD sent rx to [facility] pharmacy for the vitamin D. Pt states she is no longer w/[facility] and would like rx sent to [facility] [address]. [facility].Marland KitchenRaechel [name]

## 2020-02-01 ENCOUNTER — Other Ambulatory Visit: Payer: Self-pay | Admitting: Surgical

## 2020-02-01 ENCOUNTER — Telehealth: Payer: Self-pay | Admitting: Orthopaedic Surgery

## 2020-02-01 MED ORDER — CYCLOBENZAPRINE HCL 10 MG PO TABS: ORAL_TABLET | ORAL | 0 refills | Status: DC

## 2020-02-01 MED ORDER — LIDOCAINE 5 % EX PTCH: 1.0000 | MEDICATED_PATCH | Freq: Every day | CUTANEOUS | 3 refills | Status: DC | PRN

## 2020-02-01 NOTE — Telephone Encounter (Signed)
Please advise. Thank you

## 2020-02-01 NOTE — Telephone Encounter (Signed)
Patient called.   She is requesting a refill on her Lidocaine and Flexeril   Call back: [phone]

## 2020-02-01 NOTE — Telephone Encounter (Signed)
Patient notified

## 2020-02-13 ENCOUNTER — Telehealth: Payer: Self-pay | Admitting: Internal Medicine

## 2020-02-13 NOTE — Telephone Encounter (Signed)
Pt contacted and informed that she was taking HCTZ 25 mg.
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
## 2020-02-20 ENCOUNTER — Telehealth: Payer: Self-pay | Admitting: Internal Medicine

## 2020-02-20 ENCOUNTER — Telehealth: Payer: Self-pay | Admitting: Specialist
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
## 2020-02-21 NOTE — Telephone Encounter (Signed)
Ok as per routine refill reqeust, albeit early

## 2020-02-21 NOTE — Telephone Encounter (Signed)
See message.
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
## 2020-02-22 NOTE — Telephone Encounter (Signed)
    Patient calling to request refill hydrochlorothiazide (HYDRODIURIL) 25 MG tablet

## 2020-02-23 ENCOUNTER — Other Ambulatory Visit: Payer: Self-pay

## 2020-02-23 DIAGNOSIS — I10 Essential (primary) hypertension: Secondary | ICD-10-CM

## 2020-02-23 MED ORDER — HYDROCHLOROTHIAZIDE 25 MG PO TABS: 25.0000 mg | ORAL_TABLET | Freq: Every day | ORAL | 3 refills | Status: DC

## 2020-02-24 ENCOUNTER — Telehealth: Payer: Self-pay | Admitting: Internal Medicine

## 2020-02-24 NOTE — Telephone Encounter (Signed)
Error

## 2020-03-06 ENCOUNTER — Telehealth: Payer: Self-pay

## 2020-03-06 NOTE — Telephone Encounter (Signed)
New message    The patient is seen on the news over the weekend levothyroxine (SYNTHROID) 200 MCG tablet was on the recall list.    Please advise on continue medication or alternative.

## 2020-03-07 MED ORDER — ALPRAZOLAM 0.5 MG PO TABS: 0.5000 mg | ORAL_TABLET | Freq: Two times a day (BID) | ORAL | 0 refills | Status: DC | PRN

## 2020-03-07 NOTE — Telephone Encounter (Signed)
Notified pt MD sent rx to [facility] 

## 2020-03-07 NOTE — Telephone Encounter (Signed)
Done erx 

## 2020-03-07 NOTE — Telephone Encounter (Signed)
Ok to ask pt to call her pharmacy to see if this is true for her LOT of medication; thanks
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
## 2020-06-26 ENCOUNTER — Telehealth: Payer: Self-pay | Admitting: Internal Medicine

## 2020-06-26 NOTE — Telephone Encounter (Signed)
New Message:   1.Medication Requested: SUMAtriptan (IMITREX) 100 MG tablet 2. Pharmacy (Name, Street, [place]): [facility] [phone] - [place], [place] - [address] 3. On Med List: Yes  4. Last Visit with PCP: 10/19/19  5. Next visit date with PCP: None   Agent: Please be advised that RX refills may take up to 3 business days. We ask that you follow-up with your pharmacy.

## 2020-06-29 ENCOUNTER — Other Ambulatory Visit: Payer: Self-pay

## 2020-06-29 MED ORDER — SUMATRIPTAN SUCCINATE 100 MG PO TABS: ORAL_TABLET | ORAL | 11 refills | Status: DC

## 2020-06-29 NOTE — Telephone Encounter (Signed)
Sent eRx.

## 2020-07-06 ENCOUNTER — Ambulatory Visit (INDEPENDENT_AMBULATORY_CARE_PROVIDER_SITE_OTHER): Payer: 59 | Admitting: Internal Medicine

## 2020-07-06 ENCOUNTER — Other Ambulatory Visit: Payer: Self-pay

## 2020-07-06 ENCOUNTER — Encounter: Payer: Self-pay | Admitting: Internal Medicine

## 2020-07-06 VITALS — BP 160/86 | HR 98 | Temp 98.3°F | Ht 65.0 in

## 2020-07-06 DIAGNOSIS — M25562 Pain in left knee: Secondary | ICD-10-CM

## 2020-07-06 DIAGNOSIS — M545 Low back pain, unspecified: Secondary | ICD-10-CM

## 2020-07-06 DIAGNOSIS — R739 Hyperglycemia, unspecified: Secondary | ICD-10-CM | POA: Diagnosis not present

## 2020-07-06 DIAGNOSIS — F419 Anxiety disorder, unspecified: Secondary | ICD-10-CM

## 2020-07-06 DIAGNOSIS — G43809 Other migraine, not intractable, without status migrainosus: Secondary | ICD-10-CM

## 2020-07-06 DIAGNOSIS — G8929 Other chronic pain: Secondary | ICD-10-CM

## 2020-07-06 DIAGNOSIS — I10 Essential (primary) hypertension: Secondary | ICD-10-CM

## 2020-07-06 DIAGNOSIS — J069 Acute upper respiratory infection, unspecified: Secondary | ICD-10-CM

## 2020-07-06 DIAGNOSIS — E039 Hypothyroidism, unspecified: Secondary | ICD-10-CM

## 2020-07-06 MED ORDER — DOXYCYCLINE HYCLATE 100 MG PO TABS: 100.0000 mg | ORAL_TABLET | Freq: Two times a day (BID) | ORAL | 0 refills | Status: DC

## 2020-07-06 MED ORDER — ALPRAZOLAM 0.5 MG PO TABS: 0.5000 mg | ORAL_TABLET | Freq: Two times a day (BID) | ORAL | 0 refills | Status: DC | PRN

## 2020-07-06 MED ORDER — SUMATRIPTAN SUCCINATE 100 MG PO TABS
ORAL_TABLET | ORAL | 11 refills | Status: DC
Start: 2020-07-06 — End: 2021-06-12

## 2020-07-06 MED ORDER — CITALOPRAM HYDROBROMIDE 20 MG PO TABS: 20.0000 mg | ORAL_TABLET | Freq: Every day | ORAL | 3 refills | Status: DC
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
## 2020-07-06 NOTE — Patient Instructions (Signed)
Please take all new medication as prescribed - the antibiotic  Please also take OTC allegra, nasacort and mucinex for the sinus allergies and ears  Please take all new medication as prescribed - the celexa 20 mg per day for anxiety  Please continue all other medications as before, and refills have been done if requested.  Please have the pharmacy call with any other refills you may need.  Please continue your efforts at being more active, low cholesterol diet, and weight control.  Please keep your appointments with your specialists as you may have planned  We will need to get lab results from Dr Shella Spearing office  Please call our office late next week if you do not hear anything about thyroid medication change  Please make an Appointment to return in 6 months, or sooner if needed,

## 2020-07-08 ENCOUNTER — Encounter: Payer: Self-pay | Admitting: Internal Medicine

## 2020-07-08 DIAGNOSIS — G8929 Other chronic pain: Secondary | ICD-10-CM | POA: Insufficient documentation

## 2020-07-08 DIAGNOSIS — F419 Anxiety disorder, unspecified: Secondary | ICD-10-CM | POA: Insufficient documentation

## 2020-07-08 DIAGNOSIS — G43909 Migraine, unspecified, not intractable, without status migrainosus: Secondary | ICD-10-CM | POA: Insufficient documentation

## 2020-07-08 DIAGNOSIS — M545 Low back pain, unspecified: Secondary | ICD-10-CM | POA: Insufficient documentation

## 2020-07-08 NOTE — Assessment & Plan Note (Addendum)
Needs left knee TKR per pt but unable due to wt, to f/u Dr Rayburn Ma

## 2020-07-08 NOTE — Assessment & Plan Note (Signed)
stable overall by history and exam, recent data reviewed with pt, and pt to continue medical treatment as before,  to f/u any worsening symptoms or concerns  

## 2020-07-08 NOTE — Assessment & Plan Note (Addendum)
Pt has applied for soc sec disability, to f/u dr Otelia Sergeant

## 2020-07-08 NOTE — Assessment & Plan Note (Signed)
Ok refill imitrex prn

## 2020-07-08 NOTE — Assessment & Plan Note (Addendum)
Mild to mod, for antibx course,  to f/u any worsening symptoms or concerns  I spent 31 minutes in preparing to see the patient by review of recent labs, imaging and procedures, obtaining and reviewing separately obtained history, communicating with the patient and family or caregiver, ordering medications, tests or procedures, and documenting clinical information in the EHR including the differential Dx, treatment, and any further evaluation and other management of uri, lbp, left knee pain, anxiety, hyperglycemic, htn, hypothryoidism, migraine

## 2020-07-08 NOTE — Assessment & Plan Note (Signed)
For xanax, celexa asd,  to f/u any worsening symptoms or concerns, declines counseling referral

## 2020-07-08 NOTE — Assessment & Plan Note (Signed)
stable overall by history and exam, recent data reviewed with pt, and pt to continue medical treatment as before,  to f/u any worsening symptoms or concerns, for f/u GYN labs

## 2020-07-11 ENCOUNTER — Telehealth: Payer: Self-pay | Admitting: Internal Medicine

## 2020-07-11 NOTE — Telephone Encounter (Signed)
Vanessa [name], I had sent a note to you to ask for the last labs and OV per Dr Mindi Slicker - were we able to get that notes yet faxed to [place]

## 2020-07-11 NOTE — Telephone Encounter (Signed)
Patient awaiting results from OBGYN visit on 8.26.21, with Dr. Mindi Slicker, Specifically related to her thyroid lab, Dr. Jonny Ruiz prescribed thyroid medicine but she doesn't want to get it filled until she hears from the MD if her dose needs to be adjusted.  Please contact the patient

## 2020-07-11 NOTE — Telephone Encounter (Signed)
I was able to call and leave a vm for Dr. Shella Spearing Nurse asking that she please fax over the last OV notes and labs to our office. I left our phone number and Fax as well.

## 2020-07-13 ENCOUNTER — Other Ambulatory Visit: Payer: Self-pay

## 2020-07-13 ENCOUNTER — Telehealth: Payer: Self-pay | Admitting: Internal Medicine

## 2020-07-13 DIAGNOSIS — I10 Essential (primary) hypertension: Secondary | ICD-10-CM

## 2020-07-13 MED ORDER — HYDROCHLOROTHIAZIDE 25 MG PO TABS: 25.0000 mg | ORAL_TABLET | Freq: Every day | ORAL | 3 refills | Status: DC

## 2020-07-13 MED ORDER — LOSARTAN POTASSIUM 100 MG PO TABS
100.0000 mg | ORAL_TABLET | Freq: Every day | ORAL | 3 refills | Status: DC
Start: 2020-07-13 — End: 2021-06-07

## 2020-07-13 MED ORDER — LEVOTHYROXINE SODIUM 200 MCG PO TABS: ORAL_TABLET | ORAL | 3 refills | Status: DC

## 2020-07-13 NOTE — Telephone Encounter (Signed)
     Patient wants to know if lab results indicate the need for cholesterol medication Also has follow up questions about heart murmur. Does she need a referral to Cardiology

## 2020-07-13 NOTE — Telephone Encounter (Signed)
   1.Medication Requested: losartan (COZAAR) 100 MG tablet levothyroxine (SYNTHROID) 200 MCG tablet hydrochlorothiazide (HYDRODIURIL) 25 MG tablet   2. Pharmacy (Name, Street, [place]):[facility] [phone] - [place], [place] - [address]  3. On Med List: yes  4. Last Visit with PCP: 07/06/20  5. Next visit date with PCP: n/a   Agent: Please be advised that RX refills may take up to 3 business days. We ask that you follow-up with your pharmacy.

## 2020-07-16 ENCOUNTER — Other Ambulatory Visit: Payer: Self-pay

## 2020-07-16 NOTE — Telephone Encounter (Signed)
Sent to Dr. John. 

## 2020-07-16 NOTE — Telephone Encounter (Signed)
Cholesterol in 2019 was normal, so I suspect following a lower cholesterol diet would be good for her  If she has no particular symptoms from the heart murmur, especially if it has been there for years, she would not necessarily need echocardiogram or cardiology referral as many heart murmurs are benign and often do not represent some worsening valve issue or other heart disease

## 2020-07-19 NOTE — Telephone Encounter (Signed)
Called pt, LVM.   

## 2020-08-06 ENCOUNTER — Telehealth: Payer: Self-pay | Admitting: Internal Medicine

## 2020-08-06 NOTE — Telephone Encounter (Signed)
Sent to Dr. John to advise. 

## 2020-08-06 NOTE — Telephone Encounter (Signed)
Patient called and said that she is still having pain in her left ear and she was wondering if she could have another antibiotic called into [id number] - [place], [place] - [address]  Please call pt back;[phone]

## 2020-08-06 NOTE — Telephone Encounter (Signed)
We should hold for now unless there is fever or definitely worsening pain; of course allergies and ear getting clogged up are very common at this time and she should consider taking OTC allegra, nasacort and mucinex to help allergies and getting ears to be unclogged

## 2020-08-07 NOTE — Telephone Encounter (Signed)
Tried calling pt to advise her on Dr. Raphael Gibney note. Was not able to reach pt as her phone was busy on both attempts.

## 2020-08-23 ENCOUNTER — Ambulatory Visit: Payer: 59 | Admitting: Specialist

## 2020-09-13 ENCOUNTER — Ambulatory Visit: Payer: 59 | Admitting: Specialist

## 2020-10-24 ENCOUNTER — Other Ambulatory Visit: Payer: Self-pay

## 2020-10-24 NOTE — Telephone Encounter (Signed)
Patient called she is requesting a rx refill for flexeril and tylenol 4 CB:[phone]

## 2020-10-24 NOTE — Addendum Note (Signed)
Addended by: Penne Lash, Neysa Bonito N on: 10/24/2020 03:02 PM   Modules accepted: Orders

## 2020-10-30 ENCOUNTER — Telehealth: Payer: Self-pay | Admitting: Specialist

## 2020-10-30 NOTE — Telephone Encounter (Signed)
Pt called stating she would like to get a refill of her flexeril and tylenol #4 as it's been getting colder and her pain is coming back; she would like to get this sent in as soon as possible please.  [phone]

## 2020-10-30 NOTE — Telephone Encounter (Signed)
I called and advised that she needs to make an appt to be seen before he will refill pain meds. She has an appt on 11/16/20 @ 10:45

## 2020-11-16 ENCOUNTER — Ambulatory Visit (INDEPENDENT_AMBULATORY_CARE_PROVIDER_SITE_OTHER): Payer: 59

## 2020-11-16 ENCOUNTER — Encounter: Payer: Self-pay | Admitting: Specialist

## 2020-11-16 ENCOUNTER — Ambulatory Visit (INDEPENDENT_AMBULATORY_CARE_PROVIDER_SITE_OTHER): Payer: 59 | Admitting: Specialist

## 2020-11-16 ENCOUNTER — Other Ambulatory Visit: Payer: Self-pay

## 2020-11-16 VITALS — BP 145/97 | HR 85 | Ht 65.0 in | Wt 380.0 lb

## 2020-11-16 DIAGNOSIS — M542 Cervicalgia: Secondary | ICD-10-CM

## 2020-11-16 DIAGNOSIS — Z6841 Body Mass Index (BMI) 40.0 and over, adult: Secondary | ICD-10-CM

## 2020-11-16 DIAGNOSIS — M4317 Spondylolisthesis, lumbosacral region: Secondary | ICD-10-CM | POA: Diagnosis not present

## 2020-11-16 DIAGNOSIS — M48062 Spinal stenosis, lumbar region with neurogenic claudication: Secondary | ICD-10-CM

## 2020-11-16 DIAGNOSIS — M4807 Spinal stenosis, lumbosacral region: Secondary | ICD-10-CM

## 2020-11-16 DIAGNOSIS — M4722 Other spondylosis with radiculopathy, cervical region: Secondary | ICD-10-CM

## 2020-11-16 DIAGNOSIS — M25562 Pain in left knee: Secondary | ICD-10-CM

## 2020-11-16 NOTE — Patient Instructions (Addendum)
Avoid bending, stooping and avoid lifting weights greater than 10 lbs. Avoid prolong standing and walking. Avoid frequent bending and stooping  No lifting greater than 10 lbs. May use ice or moist heat for pain. Weight loss is of benefit. Handicap license is approved. Myelogram and post myelogram CT scan lumbar spine due to the MRI not being able to show the dynamic spondylolisthesis  At both L4-5 and L5-S1  And the degree of nerve compression when standing and walking. Cervical spondylosis treat with PT (physical therapy) She is totally disabled due to her lumbar disease, unable to stand or ambulate even short distances due to lumbar instability and  Nerve compression and unable to lift more than 5 lbs or bend or stoop. Sitting is also restricted due to disc degeneration and disc Pain.
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
## 2020-11-20 ENCOUNTER — Other Ambulatory Visit: Payer: Self-pay

## 2020-11-20 MED ORDER — CYCLOBENZAPRINE HCL 10 MG PO TABS: ORAL_TABLET | ORAL | 0 refills | Status: DC

## 2020-11-20 NOTE — Telephone Encounter (Signed)
Patient called she is requesting rx refill, for flexeril and something stronger than tylenol 4. CB:[phone]

## 2020-11-20 NOTE — Addendum Note (Signed)
Addended by: Penne Lash, Neysa Bonito N on: 11/20/2020 03:00 PM   Modules accepted: Orders

## 2020-11-21 ENCOUNTER — Telehealth: Payer: Self-pay

## 2020-11-22 NOTE — Telephone Encounter (Signed)
Pt wants to consider whether or not to have myelogram.  She will call us if she decides to do this.

## 2020-11-23 ENCOUNTER — Telehealth: Payer: Self-pay | Admitting: Specialist

## 2020-11-23 NOTE — Telephone Encounter (Signed)
Pt called stating she was told she needed to see Dr.Nitka in office before he could prescribe her any pain medicine and she states she came to her appt on 11/16/20 but nothing has been called in. Pt would like a CB to let her know what her rx is and when it is ready for pick up  [phone]

## 2020-11-26 NOTE — Telephone Encounter (Signed)
Please advise 

## 2020-11-27 ENCOUNTER — Other Ambulatory Visit: Payer: Self-pay | Admitting: Specialist

## 2020-11-27 MED ORDER — ACETAMINOPHEN-CODEINE #4 300-60 MG PO TABS: 1.0000 | ORAL_TABLET | ORAL | 0 refills | Status: AC | PRN

## 2020-11-27 NOTE — Telephone Encounter (Signed)
Rx for tylenol with codiene, I do not recommend anything stronger.

## 2020-11-27 NOTE — Telephone Encounter (Signed)
I called and advised patient that this has been done 

## 2020-12-20 ENCOUNTER — Ambulatory Visit: Payer: 59 | Admitting: Specialist

## 2021-02-22 ENCOUNTER — Encounter: Payer: Self-pay | Admitting: Internal Medicine

## 2021-02-22 ENCOUNTER — Telehealth (INDEPENDENT_AMBULATORY_CARE_PROVIDER_SITE_OTHER): Payer: 59 | Admitting: Internal Medicine

## 2021-02-22 DIAGNOSIS — J209 Acute bronchitis, unspecified: Secondary | ICD-10-CM

## 2021-02-22 DIAGNOSIS — J4541 Moderate persistent asthma with (acute) exacerbation: Secondary | ICD-10-CM

## 2021-02-22 MED ORDER — HYDROCODONE-HOMATROPINE 5-1.5 MG/5ML PO SYRP: 5.0000 mL | ORAL_SOLUTION | Freq: Three times a day (TID) | ORAL | 0 refills | Status: DC | PRN

## 2021-02-22 MED ORDER — DOXYCYCLINE HYCLATE 100 MG PO TABS: 100.0000 mg | ORAL_TABLET | Freq: Two times a day (BID) | ORAL | 0 refills | Status: DC

## 2021-02-22 MED ORDER — PREDNISONE 20 MG PO TABS: 40.0000 mg | ORAL_TABLET | Freq: Every day | ORAL | 0 refills | Status: DC

## 2021-02-22 NOTE — Progress Notes (Signed)
Virtual Visit via Video Note  I connected with Vanessa [name] on 02/22/21 at  3:00 PM EDT by a video enabled telemedicine application and verified that I am speaking with the correct person using two identifiers.   I discussed the limitations of evaluation and management by telemedicine and the availability of in person appointments. The patient expressed understanding and agreed to proceed.  Present for the visit:  Myself, Dr Cheryll Cockayne, Delrae Sawyers.  The patient is currently at home and I am in the office.    No referring provider.    History of Present Illness: She is here for an acute visit for cold symptoms.   Her symptoms started last week  She has asthma and allergies.  She knows she does not have covid-she has been inside and has not been around anyone.  She typically gets something like this once a year because of the pollen, her allergies and her asthma.  She is experiencing nasal congestion, ear pain, sinus pain, sore throat that has resolved, productive cough, shortness of breath, wheezing, chest pain from coughing and headaches.  Her symptoms tend to be worse first thing in the morning and at night.   She has tried taking inhalers and they do help.  The cough is really affecting her ability to rest and causing more pain.  She is concerned that her symptoms and especially her asthma symptoms will continue to get worse.     Review of Systems  Constitutional: Negative for chills and fever.  HENT: Positive for congestion, ear pain, sinus pain and sore throat (resolved).   Respiratory: Positive for cough, sputum production, shortness of breath and wheezing.   Cardiovascular: Positive for chest pain (from coughing).  Gastrointestinal: Negative for abdominal pain, diarrhea and nausea.  Musculoskeletal: Negative for myalgias.  Neurological: Positive for headaches. Negative for dizziness.      Social History   Socioeconomic History  . Marital status: Single    Spouse  name: Not on file  . Number of children: 1  . Years of education: Not on file  . Highest education level: Not on file  Occupational History  . Occupation: Risk manager: [employer]  Tobacco Use  . Smoking status: Never Smoker  . Smokeless tobacco: Never Used  Vaping Use  . Vaping Use: Never used  Substance and Sexual Activity  . Alcohol use: No    Alcohol/week: 0.0 standard drinks  . Drug use: No  . Sexual activity: Yes  Other Topics Concern  . Not on file  Social History Narrative  . Not on file   Social Determinants of Health   Financial Resource Strain: Not on file  Food Insecurity: Not on file  Transportation Needs: Not on file  Physical Activity: Not on file  Stress: Not on file  Social Connections: Not on file     Observations/Objective:    Assessment and Plan:  Acute bronchitis, acute asthma exacerbation: Asthma exacerbation is moderate and persistent and likely related to section with some wounds from allergies Continue Qvar daily and albuterol inhaler every 6 hours as needed Continue allergy medications and over-the-counter cold medications for symptom relief Start doxycycline 100 mg twice daily x10 days Start prednisone 40 mg daily x5 days Hycodan cough syrup 5 mL every 8 hours as needed for cough  Advised that she needs to call if her symptoms or not improving or if they worsen   Follow Up Instructions:    I discussed the assessment  and treatment plan with the patient. The patient was provided an opportunity to ask questions and all were answered. The patient agreed with the plan and demonstrated an understanding of the instructions.   The patient was advised to call back or seek an in-person evaluation if the symptoms worsen or if the condition fails to improve as anticipated.  Time spent on telephone call: 14 minutes   Pincus Sanes, MD

## 2021-03-21 ENCOUNTER — Ambulatory Visit: Payer: 59 | Admitting: Internal Medicine

## 2021-03-22 ENCOUNTER — Ambulatory Visit: Payer: 59 | Admitting: Internal Medicine

## 2021-03-25 ENCOUNTER — Telehealth: Payer: Self-pay | Admitting: Internal Medicine

## 2021-03-25 NOTE — Telephone Encounter (Signed)
    Patient calling to report she has been coughing and having ear pain since virtual appointment last month with Dr Lawerance Bach She has declined to get  COVID tested, she states she doesn't go out of the house. She is requesting in office appointment, based on history cough due to allergies   Please advise

## 2021-03-26 NOTE — Telephone Encounter (Signed)
Ok for OV?

## 2021-03-26 NOTE — Telephone Encounter (Signed)
Appointment has been scheduled.

## 2021-03-28 ENCOUNTER — Ambulatory Visit: Payer: 59 | Admitting: Internal Medicine

## 2021-03-28 ENCOUNTER — Telehealth: Payer: Self-pay | Admitting: Internal Medicine

## 2021-03-28 MED ORDER — PREDNISONE 20 MG PO TABS: 40.0000 mg | ORAL_TABLET | Freq: Every day | ORAL | 0 refills | Status: DC

## 2021-03-28 NOTE — Telephone Encounter (Signed)
Patient had an appointment today at 2:20PM but had to reschedule due to the provider. Her appointment is now on 5.31.22.  Patient was wondering if she could get another PREDNISONE prescription called in. This is what was prescribed to her when seeing Dr.Burns and she stated it helped a lot.    Please advise

## 2021-03-28 NOTE — Telephone Encounter (Signed)
Ok this is done 

## 2021-04-02 ENCOUNTER — Other Ambulatory Visit: Payer: Self-pay

## 2021-04-02 ENCOUNTER — Ambulatory Visit (INDEPENDENT_AMBULATORY_CARE_PROVIDER_SITE_OTHER): Payer: 59 | Admitting: Internal Medicine

## 2021-04-02 ENCOUNTER — Encounter: Payer: Self-pay | Admitting: Internal Medicine

## 2021-04-02 VITALS — BP 162/84 | HR 84 | Temp 99.0°F | Ht 65.0 in | Wt 380.0 lb

## 2021-04-02 DIAGNOSIS — R739 Hyperglycemia, unspecified: Secondary | ICD-10-CM | POA: Diagnosis not present

## 2021-04-02 DIAGNOSIS — J3089 Other allergic rhinitis: Secondary | ICD-10-CM

## 2021-04-02 DIAGNOSIS — J069 Acute upper respiratory infection, unspecified: Secondary | ICD-10-CM | POA: Diagnosis not present

## 2021-04-02 DIAGNOSIS — H6983 Other specified disorders of Eustachian tube, bilateral: Secondary | ICD-10-CM | POA: Diagnosis not present

## 2021-04-02 DIAGNOSIS — J302 Other seasonal allergic rhinitis: Secondary | ICD-10-CM

## 2021-04-02 MED ORDER — PREDNISONE 10 MG PO TABS: ORAL_TABLET | ORAL | 0 refills | Status: DC

## 2021-04-02 MED ORDER — GUAIFENESIN ER 600 MG PO TB12: 1200.0000 mg | ORAL_TABLET | Freq: Two times a day (BID) | ORAL | 1 refills | Status: DC | PRN

## 2021-04-02 MED ORDER — MONTELUKAST SODIUM 10 MG PO TABS: 10.0000 mg | ORAL_TABLET | Freq: Every day | ORAL | 3 refills | Status: DC

## 2021-04-02 MED ORDER — AZITHROMYCIN 250 MG PO TABS: ORAL_TABLET | ORAL | 1 refills | Status: AC

## 2021-04-02 NOTE — Assessment & Plan Note (Signed)
Also for mucinex bid prn,  to f/u any worsening symptoms or concerns 

## 2021-04-02 NOTE — Patient Instructions (Signed)
Please take all new medication as prescribed - the antibiotic, prednisone, singulair, and mucinex  Please continue all other medications as before, including the antihistamine and nasal steroid spray  Please have the pharmacy call with any other refills you may need.  Please continue your efforts at being more active, low cholesterol diet, and weight control..  Please keep your appointments with your specialists as you may have planned

## 2021-04-02 NOTE — Assessment & Plan Note (Signed)
Uncontrolled, for prednisone, singulair, continue anithistaminem nasonex, and consider allergy referral

## 2021-04-02 NOTE — Assessment & Plan Note (Signed)
Mild to mod, for antibx course,  to f/u any worsening symptoms or concerns 

## 2021-04-02 NOTE — Assessment & Plan Note (Signed)
Lab Results  Component Value Date   HGBA1C 6.3 05/16/2019   Stable, pt to continue current medical treatment  - diet
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
## 2021-04-17 ENCOUNTER — Telehealth: Payer: Self-pay | Admitting: Internal Medicine

## 2021-04-17 MED ORDER — PREDNISONE 10 MG PO TABS: ORAL_TABLET | ORAL | 0 refills | Status: DC

## 2021-04-17 NOTE — Telephone Encounter (Signed)
Patient calling to report she is still having ear pain and congestion   Patient requesting additional predniSONE (DELTASONE) 10 MG tablet   Please call patient

## 2021-04-17 NOTE — Telephone Encounter (Signed)
Ok done erx 

## 2021-04-18 NOTE — Telephone Encounter (Signed)
Patient notified that medication was sent.

## 2021-05-31 ENCOUNTER — Emergency Department (HOSPITAL_COMMUNITY): Payer: 59

## 2021-05-31 ENCOUNTER — Inpatient Hospital Stay (HOSPITAL_COMMUNITY)
Admission: EM | Admit: 2021-05-31 | Discharge: 2021-06-07 | DRG: 637 | Disposition: A | Discharge status: Home / Self Care | Payer: 59 | Attending: Internal Medicine | Admitting: Internal Medicine

## 2021-05-31 ENCOUNTER — Other Ambulatory Visit: Payer: Self-pay

## 2021-05-31 ENCOUNTER — Inpatient Hospital Stay (HOSPITAL_COMMUNITY): Payer: 59

## 2021-05-31 DIAGNOSIS — R52 Pain, unspecified: Secondary | ICD-10-CM

## 2021-05-31 DIAGNOSIS — E8881 Metabolic syndrome: Secondary | ICD-10-CM | POA: Diagnosis present

## 2021-05-31 DIAGNOSIS — E039 Hypothyroidism, unspecified: Secondary | ICD-10-CM | POA: Diagnosis not present

## 2021-05-31 DIAGNOSIS — E875 Hyperkalemia: Secondary | ICD-10-CM | POA: Diagnosis present

## 2021-05-31 DIAGNOSIS — Z6841 Body Mass Index (BMI) 40.0 and over, adult: Secondary | ICD-10-CM | POA: Diagnosis not present

## 2021-05-31 DIAGNOSIS — Z825 Family history of asthma and other chronic lower respiratory diseases: Secondary | ICD-10-CM | POA: Diagnosis not present

## 2021-05-31 DIAGNOSIS — R059 Cough, unspecified: Secondary | ICD-10-CM

## 2021-05-31 DIAGNOSIS — O0383 Metabolic disorder following complete or unspecified spontaneous abortion: Secondary | ICD-10-CM | POA: Diagnosis present

## 2021-05-31 DIAGNOSIS — Z7951 Long term (current) use of inhaled steroids: Secondary | ICD-10-CM

## 2021-05-31 DIAGNOSIS — E1169 Type 2 diabetes mellitus with other specified complication: Secondary | ICD-10-CM | POA: Diagnosis not present

## 2021-05-31 DIAGNOSIS — K859 Acute pancreatitis without necrosis or infection, unspecified: Secondary | ICD-10-CM | POA: Diagnosis present

## 2021-05-31 DIAGNOSIS — Z794 Long term (current) use of insulin: Secondary | ICD-10-CM | POA: Diagnosis not present

## 2021-05-31 DIAGNOSIS — N179 Acute kidney failure, unspecified: Secondary | ICD-10-CM | POA: Diagnosis present

## 2021-05-31 DIAGNOSIS — R5383 Other fatigue: Secondary | ICD-10-CM | POA: Diagnosis not present

## 2021-05-31 DIAGNOSIS — H538 Other visual disturbances: Secondary | ICD-10-CM | POA: Diagnosis present

## 2021-05-31 DIAGNOSIS — M25572 Pain in left ankle and joints of left foot: Secondary | ICD-10-CM | POA: Diagnosis present

## 2021-05-31 DIAGNOSIS — G8929 Other chronic pain: Secondary | ICD-10-CM | POA: Diagnosis present

## 2021-05-31 DIAGNOSIS — Z79890 Hormone replacement therapy (postmenopausal): Secondary | ICD-10-CM

## 2021-05-31 DIAGNOSIS — E890 Postprocedural hypothyroidism: Secondary | ICD-10-CM | POA: Diagnosis present

## 2021-05-31 DIAGNOSIS — J45909 Unspecified asthma, uncomplicated: Secondary | ICD-10-CM | POA: Diagnosis present

## 2021-05-31 DIAGNOSIS — Z89519 Acquired absence of unspecified leg below knee: Secondary | ICD-10-CM | POA: Diagnosis not present

## 2021-05-31 DIAGNOSIS — Z9119 Patient's noncompliance with other medical treatment and regimen: Secondary | ICD-10-CM

## 2021-05-31 DIAGNOSIS — F32A Depression, unspecified: Secondary | ICD-10-CM | POA: Diagnosis present

## 2021-05-31 DIAGNOSIS — M1990 Unspecified osteoarthritis, unspecified site: Secondary | ICD-10-CM | POA: Diagnosis present

## 2021-05-31 DIAGNOSIS — O0388 Urinary tract infection following complete or unspecified spontaneous abortion: Secondary | ICD-10-CM | POA: Diagnosis present

## 2021-05-31 DIAGNOSIS — Z8249 Family history of ischemic heart disease and other diseases of the circulatory system: Secondary | ICD-10-CM

## 2021-05-31 DIAGNOSIS — I10 Essential (primary) hypertension: Secondary | ICD-10-CM | POA: Diagnosis present

## 2021-05-31 DIAGNOSIS — K851 Biliary acute pancreatitis without necrosis or infection: Secondary | ICD-10-CM | POA: Diagnosis not present

## 2021-05-31 DIAGNOSIS — M48061 Spinal stenosis, lumbar region without neurogenic claudication: Secondary | ICD-10-CM | POA: Diagnosis present

## 2021-05-31 DIAGNOSIS — Z791 Long term (current) use of non-steroidal anti-inflammatories (NSAID): Secondary | ICD-10-CM

## 2021-05-31 DIAGNOSIS — Z20822 Contact with and (suspected) exposure to covid-19: Secondary | ICD-10-CM | POA: Diagnosis present

## 2021-05-31 DIAGNOSIS — E860 Dehydration: Secondary | ICD-10-CM | POA: Diagnosis present

## 2021-05-31 DIAGNOSIS — G4733 Obstructive sleep apnea (adult) (pediatric): Secondary | ICD-10-CM | POA: Diagnosis present

## 2021-05-31 DIAGNOSIS — K760 Fatty (change of) liver, not elsewhere classified: Secondary | ICD-10-CM | POA: Diagnosis present

## 2021-05-31 DIAGNOSIS — R946 Abnormal results of thyroid function studies: Secondary | ICD-10-CM | POA: Diagnosis present

## 2021-05-31 DIAGNOSIS — Z887 Allergy status to serum and vaccine status: Secondary | ICD-10-CM

## 2021-05-31 DIAGNOSIS — R4182 Altered mental status, unspecified: Secondary | ICD-10-CM | POA: Diagnosis present

## 2021-05-31 DIAGNOSIS — E1110 Type 2 diabetes mellitus with ketoacidosis without coma: Principal | ICD-10-CM | POA: Diagnosis present

## 2021-05-31 DIAGNOSIS — R112 Nausea with vomiting, unspecified: Secondary | ICD-10-CM

## 2021-05-31 DIAGNOSIS — Z886 Allergy status to analgesic agent status: Secondary | ICD-10-CM

## 2021-05-31 DIAGNOSIS — F419 Anxiety disorder, unspecified: Secondary | ICD-10-CM | POA: Diagnosis present

## 2021-05-31 DIAGNOSIS — R748 Abnormal levels of other serum enzymes: Secondary | ICD-10-CM

## 2021-05-31 DIAGNOSIS — Z79899 Other long term (current) drug therapy: Secondary | ICD-10-CM

## 2021-05-31 DIAGNOSIS — R7989 Other specified abnormal findings of blood chemistry: Secondary | ICD-10-CM

## 2021-05-31 DIAGNOSIS — G9341 Metabolic encephalopathy: Secondary | ICD-10-CM | POA: Diagnosis present

## 2021-05-31 DIAGNOSIS — N390 Urinary tract infection, site not specified: Secondary | ICD-10-CM | POA: Diagnosis present

## 2021-05-31 LAB — COMPREHENSIVE METABOLIC PANEL
ALT: 33 U/L (ref 0–44)
AST: 24 U/L (ref 15–41)
Albumin: 3.9 g/dL (ref 3.5–5.0)
Alkaline Phosphatase: 97 U/L (ref 38–126)
Anion gap: 33 — ABNORMAL HIGH (ref 5–15)
BUN: 44 mg/dL — ABNORMAL HIGH (ref 6–20)
CO2: 9 mmol/L — ABNORMAL LOW (ref 22–32)
Calcium: 9.3 mg/dL (ref 8.9–10.3)
Chloride: 92 mmol/L — ABNORMAL LOW (ref 98–111)
Creatinine, Ser: 2.34 mg/dL — ABNORMAL HIGH (ref 0.44–1.00)
GFR, Estimated: 25 mL/min — ABNORMAL LOW (ref >60)
Glucose, Bld: 608 mg/dL (ref 70–99)
Potassium: 5.2 mmol/L — ABNORMAL HIGH (ref 3.5–5.1)
Sodium: 134 mmol/L — ABNORMAL LOW (ref 135–145)
Total Bilirubin: 3 mg/dL — ABNORMAL HIGH (ref 0.3–1.2)
Total Protein: 9 g/dL — ABNORMAL HIGH (ref 6.5–8.1)

## 2021-05-31 LAB — I-STAT CHEM 8, ED
BUN: 47 mg/dL — ABNORMAL HIGH (ref 6–20)
Calcium, Ion: 1.02 mmol/L — ABNORMAL LOW (ref 1.15–1.40)
Chloride: 102 mmol/L (ref 98–111)
Creatinine, Ser: 1.6 mg/dL — ABNORMAL HIGH (ref 0.44–1.00)
Glucose, Bld: 641 mg/dL (ref 70–99)
HCT: 47 % — ABNORMAL HIGH (ref 36.0–46.0)
Hemoglobin: 16 g/dL — ABNORMAL HIGH (ref 12.0–15.0)
Potassium: 5.3 mmol/L — ABNORMAL HIGH (ref 3.5–5.1)
Sodium: 134 mmol/L — ABNORMAL LOW (ref 135–145)
TCO2: 13 mmol/L — ABNORMAL LOW (ref 22–32)

## 2021-05-31 LAB — URINALYSIS, ROUTINE W REFLEX MICROSCOPIC
Bilirubin Urine: NEGATIVE
Glucose, UA: >=500 mg/dL — AB
Ketones, ur: 80 mg/dL — AB
Leukocytes,Ua: NEGATIVE
Nitrite: NEGATIVE
Protein, ur: 100 mg/dL — AB
RBC / HPF: >50 RBC/hpf — ABNORMAL HIGH (ref 0–5)
Specific Gravity, Urine: 1.023 (ref 1.005–1.030)
pH: 6 (ref 5.0–8.0)

## 2021-05-31 LAB — I-STAT ARTERIAL BLOOD GAS, ED
Acid-base deficit: 13 mmol/L — ABNORMAL HIGH (ref 0.0–2.0)
Bicarbonate: 11.5 mmol/L — ABNORMAL LOW (ref 20.0–28.0)
Calcium, Ion: 1.03 mmol/L — ABNORMAL LOW (ref 1.15–1.40)
HCT: 47 % — ABNORMAL HIGH (ref 36.0–46.0)
Hemoglobin: 16 g/dL — ABNORMAL HIGH (ref 12.0–15.0)
O2 Saturation: 87 %
Potassium: 5.2 mmol/L — ABNORMAL HIGH (ref 3.5–5.1)
Sodium: 134 mmol/L — ABNORMAL LOW (ref 135–145)
TCO2: 12 mmol/L — ABNORMAL LOW (ref 22–32)
pCO2 arterial: 24 mmHg — ABNORMAL LOW (ref 32.0–48.0)
pH, Arterial: 7.288 — ABNORMAL LOW (ref 7.350–7.450)
pO2, Arterial: 58 mmHg — ABNORMAL LOW (ref 83.0–108.0)

## 2021-05-31 LAB — CBG MONITORING, ED
Glucose-Capillary: >600 mg/dL (ref 70–99)
Glucose-Capillary: 509 mg/dL (ref 70–99)
Glucose-Capillary: 438 mg/dL — ABNORMAL HIGH (ref 70–99)
Glucose-Capillary: 430 mg/dL — ABNORMAL HIGH (ref 70–99)
Glucose-Capillary: 420 mg/dL — ABNORMAL HIGH (ref 70–99)

## 2021-05-31 LAB — CBC WITH DIFFERENTIAL/PLATELET
Abs Immature Granulocytes: 0.05 10*3/uL (ref 0.00–0.07)
Basophils Absolute: 0 10*3/uL (ref 0.0–0.1)
Basophils Relative: 0 %
Eosinophils Absolute: 0 10*3/uL (ref 0.0–0.5)
Eosinophils Relative: 0 %
HCT: 45.3 % (ref 36.0–46.0)
Hemoglobin: 14.3 g/dL (ref 12.0–15.0)
Immature Granulocytes: 0 %
Lymphocytes Relative: 18 %
Lymphs Abs: 2.2 10*3/uL (ref 0.7–4.0)
MCH: 27.5 pg (ref 26.0–34.0)
MCHC: 31.6 g/dL (ref 30.0–36.0)
MCV: 87.1 fL (ref 80.0–100.0)
Monocytes Absolute: 1.4 10*3/uL — ABNORMAL HIGH (ref 0.1–1.0)
Monocytes Relative: 11 %
Neutro Abs: 8.5 10*3/uL — ABNORMAL HIGH (ref 1.7–7.7)
Neutrophils Relative %: 71 %
Platelets: 271 10*3/uL (ref 150–400)
RBC: 5.2 MIL/uL — ABNORMAL HIGH (ref 3.87–5.11)
RDW: 21.5 % — ABNORMAL HIGH (ref 11.5–15.5)
WBC: 12.1 10*3/uL — ABNORMAL HIGH (ref 4.0–10.5)
nRBC: 0 % (ref 0.0–0.2)

## 2021-05-31 LAB — BASIC METABOLIC PANEL
Anion gap: 30 — ABNORMAL HIGH (ref 5–15)
BUN: 44 mg/dL — ABNORMAL HIGH (ref 6–20)
CO2: 13 mmol/L — ABNORMAL LOW (ref 22–32)
Calcium: 9.3 mg/dL (ref 8.9–10.3)
Chloride: 92 mmol/L — ABNORMAL LOW (ref 98–111)
Creatinine, Ser: 2.36 mg/dL — ABNORMAL HIGH (ref 0.44–1.00)
GFR, Estimated: 25 mL/min — ABNORMAL LOW (ref >60)
Glucose, Bld: 606 mg/dL (ref 70–99)
Potassium: 5.5 mmol/L — ABNORMAL HIGH (ref 3.5–5.1)
Sodium: 135 mmol/L (ref 135–145)

## 2021-05-31 LAB — I-STAT BETA HCG BLOOD, ED (MC, WL, AP ONLY): I-stat hCG, quantitative: <5 m[IU]/mL (ref <5)

## 2021-05-31 LAB — TSH: TSH: 60.161 u[IU]/mL — ABNORMAL HIGH (ref 0.350–4.500)

## 2021-05-31 LAB — RAPID URINE DRUG SCREEN, HOSP PERFORMED
Amphetamines: NOT DETECTED
Barbiturates: NOT DETECTED
Benzodiazepines: NOT DETECTED
Cocaine: NOT DETECTED
Opiates: NOT DETECTED
Tetrahydrocannabinol: NOT DETECTED

## 2021-05-31 LAB — RESP PANEL BY RT-PCR (FLU A&B, COVID) ARPGX2
Influenza A by PCR: NEGATIVE
Influenza B by PCR: NEGATIVE
SARS Coronavirus 2 by RT PCR: NEGATIVE

## 2021-05-31 LAB — GLUCOSE, CAPILLARY
Glucose-Capillary: 309 mg/dL — ABNORMAL HIGH (ref 70–99)
Glucose-Capillary: 404 mg/dL — ABNORMAL HIGH (ref 70–99)
Glucose-Capillary: 476 mg/dL — ABNORMAL HIGH (ref 70–99)

## 2021-05-31 LAB — LACTIC ACID, PLASMA: Lactic Acid, Venous: 2.3 mmol/L (ref 0.5–1.9)

## 2021-05-31 LAB — BETA-HYDROXYBUTYRIC ACID: Beta-Hydroxybutyric Acid: >8 mmol/L — ABNORMAL HIGH (ref 0.05–0.27)

## 2021-05-31 LAB — LIPASE, BLOOD: Lipase: 100 U/L — ABNORMAL HIGH (ref 11–51)

## 2021-05-31 MED ORDER — LACTATED RINGERS IV BOLUS
2000.0000 mL | Freq: Once | INTRAVENOUS | Status: AC
  Administered 2021-05-31: 2000 mL via INTRAVENOUS

## 2021-05-31 MED ORDER — ALPRAZOLAM 0.5 MG PO TABS: 0.5000 mg | ORAL_TABLET | Freq: Two times a day (BID) | ORAL | Status: DC | PRN

## 2021-05-31 MED ORDER — INSULIN REGULAR(HUMAN) IN NACL 100-0.9 UT/100ML-% IV SOLN
INTRAVENOUS | Status: DC
  Administered 2021-05-31: 5 [IU]/h via INTRAVENOUS
  Filled 2021-05-31 (×2): qty 100

## 2021-05-31 MED ORDER — ONDANSETRON HCL 4 MG/2ML IJ SOLN
4.0000 mg | Freq: Four times a day (QID) | INTRAMUSCULAR | Status: DC | PRN
  Administered 2021-06-01 – 2021-06-05 (×3): 4 mg via INTRAVENOUS
  Filled 2021-05-31 (×3): qty 2

## 2021-05-31 MED ORDER — LEVOTHYROXINE SODIUM 100 MCG/5ML IV SOLN
100.0000 ug | Freq: Every day | INTRAVENOUS | Status: DC
  Filled 2021-05-31: qty 5

## 2021-05-31 MED ORDER — DEXTROSE 50 % IV SOLN: 0.0000 mL | INTRAVENOUS | Status: DC | PRN

## 2021-05-31 MED ORDER — BUDESONIDE 0.5 MG/2ML IN SUSP
0.5000 mg | Freq: Two times a day (BID) | RESPIRATORY_TRACT | Status: DC
  Administered 2021-06-01: 0.5 mg via RESPIRATORY_TRACT
  Filled 2021-05-31 (×2): qty 2

## 2021-05-31 MED ORDER — MONTELUKAST SODIUM 10 MG PO TABS
10.0000 mg | ORAL_TABLET | Freq: Every day | ORAL | Status: DC
  Administered 2021-06-01 – 2021-06-07 (×7): 10 mg via ORAL
  Filled 2021-05-31 (×6): qty 1

## 2021-05-31 MED ORDER — ACETAMINOPHEN 325 MG PO TABS
650.0000 mg | ORAL_TABLET | Freq: Four times a day (QID) | ORAL | Status: DC | PRN
  Administered 2021-06-04 – 2021-06-06 (×4): 650 mg via ORAL
  Filled 2021-05-31 (×4): qty 2

## 2021-05-31 MED ORDER — ALBUTEROL SULFATE HFA 108 (90 BASE) MCG/ACT IN AERS
2.0000 | INHALATION_SPRAY | Freq: Four times a day (QID) | RESPIRATORY_TRACT | Status: DC | PRN
  Filled 2021-05-31: qty 6.7

## 2021-05-31 MED ORDER — POLYETHYLENE GLYCOL 3350 17 G PO PACK: 17.0000 g | PACK | Freq: Every day | ORAL | Status: DC | PRN

## 2021-05-31 MED ORDER — MELATONIN 3 MG PO TABS
3.0000 mg | ORAL_TABLET | Freq: Every evening | ORAL | Status: DC | PRN
  Administered 2021-06-03 – 2021-06-05 (×3): 3 mg via ORAL
  Filled 2021-05-31 (×3): qty 1

## 2021-05-31 MED ORDER — DEXTROSE IN LACTATED RINGERS 5 % IV SOLN: INTRAVENOUS | Status: DC

## 2021-05-31 MED ORDER — LACTATED RINGERS IV SOLN: INTRAVENOUS | Status: DC

## 2021-05-31 MED ORDER — SODIUM CHLORIDE 0.9 % IV BOLUS
1000.0000 mL | Freq: Once | INTRAVENOUS | Status: AC
  Administered 2021-05-31: 1000 mL via INTRAVENOUS

## 2021-05-31 MED ORDER — ENOXAPARIN SODIUM 30 MG/0.3ML IJ SOSY: 30.0000 mg | PREFILLED_SYRINGE | INTRAMUSCULAR | Status: DC

## 2021-05-31 MED ORDER — LEVOTHYROXINE SODIUM 100 MCG/5ML IV SOLN
75.0000 ug | Freq: Every day | INTRAVENOUS | Status: DC
  Administered 2021-05-31: 75 ug via INTRAVENOUS
  Filled 2021-05-31 (×2): qty 5

## 2021-05-31 MED ORDER — ENOXAPARIN SODIUM 100 MG/ML IJ SOSY
0.5000 mg/kg | PREFILLED_SYRINGE | INTRAMUSCULAR | Status: DC
  Administered 2021-05-31: 100 mg via SUBCUTANEOUS
  Filled 2021-05-31: qty 1
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
## 2021-05-31 NOTE — ED Triage Notes (Signed)
Pt BIBA from [facility]. Pt found altered by family. LKW was on Wednesday between 1600. Pt CBG 538 according to medic with no hx of DM. VSS: 133/90, BPM 98. Patient stated that she's been vomiting for 2 weeks. 50cc of saline via 20G IV placed by medic. Patient A&Ox3 with the exception of time.

## 2021-06-01 ENCOUNTER — Inpatient Hospital Stay (HOSPITAL_COMMUNITY): Payer: 59

## 2021-06-01 DIAGNOSIS — G4733 Obstructive sleep apnea (adult) (pediatric): Secondary | ICD-10-CM | POA: Diagnosis not present

## 2021-06-01 DIAGNOSIS — E1110 Type 2 diabetes mellitus with ketoacidosis without coma: Principal | ICD-10-CM

## 2021-06-01 DIAGNOSIS — R5383 Other fatigue: Secondary | ICD-10-CM | POA: Diagnosis not present

## 2021-06-01 LAB — BASIC METABOLIC PANEL
Anion gap: 20 — ABNORMAL HIGH (ref 5–15)
Anion gap: 13 (ref 5–15)
Anion gap: 14 (ref 5–15)
Anion gap: 17 — ABNORMAL HIGH (ref 5–15)
Anion gap: 16 — ABNORMAL HIGH (ref 5–15)
Anion gap: 16 — ABNORMAL HIGH (ref 5–15)
BUN: 33 mg/dL — ABNORMAL HIGH (ref 6–20)
BUN: 25 mg/dL — ABNORMAL HIGH (ref 6–20)
BUN: 18 mg/dL (ref 6–20)
BUN: 22 mg/dL — ABNORMAL HIGH (ref 6–20)
BUN: 19 mg/dL (ref 6–20)
BUN: 18 mg/dL (ref 6–20)
CO2: 16 mmol/L — ABNORMAL LOW (ref 22–32)
CO2: 23 mmol/L (ref 22–32)
CO2: 23 mmol/L (ref 22–32)
CO2: 22 mmol/L (ref 22–32)
CO2: 22 mmol/L (ref 22–32)
CO2: 21 mmol/L — ABNORMAL LOW (ref 22–32)
Calcium: 8.9 mg/dL (ref 8.9–10.3)
Calcium: 8.9 mg/dL (ref 8.9–10.3)
Calcium: 9 mg/dL (ref 8.9–10.3)
Calcium: 9 mg/dL (ref 8.9–10.3)
Calcium: 9 mg/dL (ref 8.9–10.3)
Calcium: 9 mg/dL (ref 8.9–10.3)
Chloride: 106 mmol/L (ref 98–111)
Chloride: 108 mmol/L (ref 98–111)
Chloride: 107 mmol/L (ref 98–111)
Chloride: 105 mmol/L (ref 98–111)
Chloride: 107 mmol/L (ref 98–111)
Chloride: 108 mmol/L (ref 98–111)
Creatinine, Ser: 1.9 mg/dL — ABNORMAL HIGH (ref 0.44–1.00)
Creatinine, Ser: 1.52 mg/dL — ABNORMAL HIGH (ref 0.44–1.00)
Creatinine, Ser: 1.32 mg/dL — ABNORMAL HIGH (ref 0.44–1.00)
Creatinine, Ser: 1.35 mg/dL — ABNORMAL HIGH (ref 0.44–1.00)
Creatinine, Ser: 1.36 mg/dL — ABNORMAL HIGH (ref 0.44–1.00)
Creatinine, Ser: 1.45 mg/dL — ABNORMAL HIGH (ref 0.44–1.00)
GFR, Estimated: 32 mL/min — ABNORMAL LOW (ref >60)
GFR, Estimated: 42 mL/min — ABNORMAL LOW (ref >60)
GFR, Estimated: 49 mL/min — ABNORMAL LOW (ref >60)
GFR, Estimated: 48 mL/min — ABNORMAL LOW (ref >60)
GFR, Estimated: 48 mL/min — ABNORMAL LOW (ref >60)
GFR, Estimated: 44 mL/min — ABNORMAL LOW (ref >60)
Glucose, Bld: 257 mg/dL — ABNORMAL HIGH (ref 70–99)
Glucose, Bld: 234 mg/dL — ABNORMAL HIGH (ref 70–99)
Glucose, Bld: 221 mg/dL — ABNORMAL HIGH (ref 70–99)
Glucose, Bld: 230 mg/dL — ABNORMAL HIGH (ref 70–99)
Glucose, Bld: 261 mg/dL — ABNORMAL HIGH (ref 70–99)
Glucose, Bld: 322 mg/dL — ABNORMAL HIGH (ref 70–99)
Potassium: 4.2 mmol/L (ref 3.5–5.1)
Potassium: 4 mmol/L (ref 3.5–5.1)
Potassium: 4 mmol/L (ref 3.5–5.1)
Potassium: 4.2 mmol/L (ref 3.5–5.1)
Potassium: 4 mmol/L (ref 3.5–5.1)
Potassium: 4.5 mmol/L (ref 3.5–5.1)
Sodium: 142 mmol/L (ref 135–145)
Sodium: 144 mmol/L (ref 135–145)
Sodium: 144 mmol/L (ref 135–145)
Sodium: 144 mmol/L (ref 135–145)
Sodium: 145 mmol/L (ref 135–145)
Sodium: 145 mmol/L (ref 135–145)

## 2021-06-01 LAB — COMPREHENSIVE METABOLIC PANEL
ALT: 28 U/L (ref 0–44)
AST: 23 U/L (ref 15–41)
Albumin: 3.3 g/dL — ABNORMAL LOW (ref 3.5–5.0)
Alkaline Phosphatase: 83 U/L (ref 38–126)
Anion gap: 15 (ref 5–15)
BUN: 29 mg/dL — ABNORMAL HIGH (ref 6–20)
CO2: 21 mmol/L — ABNORMAL LOW (ref 22–32)
Calcium: 8.9 mg/dL (ref 8.9–10.3)
Chloride: 106 mmol/L (ref 98–111)
Creatinine, Ser: 1.5 mg/dL — ABNORMAL HIGH (ref 0.44–1.00)
GFR, Estimated: 42 mL/min — ABNORMAL LOW (ref >60)
Glucose, Bld: 234 mg/dL — ABNORMAL HIGH (ref 70–99)
Potassium: 4.2 mmol/L (ref 3.5–5.1)
Sodium: 142 mmol/L (ref 135–145)
Total Bilirubin: 2 mg/dL — ABNORMAL HIGH (ref 0.3–1.2)
Total Protein: 7.8 g/dL (ref 6.5–8.1)

## 2021-06-01 LAB — CBC
HCT: 39.3 % (ref 36.0–46.0)
Hemoglobin: 12.4 g/dL (ref 12.0–15.0)
MCH: 26.8 pg (ref 26.0–34.0)
MCHC: 31.6 g/dL (ref 30.0–36.0)
MCV: 85.1 fL (ref 80.0–100.0)
Platelets: 216 10*3/uL (ref 150–400)
RBC: 4.62 MIL/uL (ref 3.87–5.11)
RDW: 20.8 % — ABNORMAL HIGH (ref 11.5–15.5)
WBC: 10 10*3/uL (ref 4.0–10.5)
nRBC: 0 % (ref 0.0–0.2)

## 2021-06-01 LAB — BLOOD GAS, ARTERIAL
Acid-base deficit: 0.5 mmol/L (ref 0.0–2.0)
Bicarbonate: 23.7 mmol/L (ref 20.0–28.0)
FIO2: 21
O2 Saturation: 96.3 %
Patient temperature: 37
pCO2 arterial: 39.8 mmHg (ref 32.0–48.0)
pH, Arterial: 7.393 (ref 7.350–7.450)
pO2, Arterial: 86.7 mmHg (ref 83.0–108.0)

## 2021-06-01 LAB — MAGNESIUM: Magnesium: 2.5 mg/dL — ABNORMAL HIGH (ref 1.7–2.4)

## 2021-06-01 LAB — GLUCOSE, CAPILLARY
Glucose-Capillary: 250 mg/dL — ABNORMAL HIGH (ref 70–99)
Glucose-Capillary: 273 mg/dL — ABNORMAL HIGH (ref 70–99)
Glucose-Capillary: 247 mg/dL — ABNORMAL HIGH (ref 70–99)
Glucose-Capillary: 190 mg/dL — ABNORMAL HIGH (ref 70–99)
Glucose-Capillary: 251 mg/dL — ABNORMAL HIGH (ref 70–99)
Glucose-Capillary: 251 mg/dL — ABNORMAL HIGH (ref 70–99)
Glucose-Capillary: 246 mg/dL — ABNORMAL HIGH (ref 70–99)
Glucose-Capillary: 196 mg/dL — ABNORMAL HIGH (ref 70–99)
Glucose-Capillary: 210 mg/dL — ABNORMAL HIGH (ref 70–99)
Glucose-Capillary: 256 mg/dL — ABNORMAL HIGH (ref 70–99)
Glucose-Capillary: 211 mg/dL — ABNORMAL HIGH (ref 70–99)
Glucose-Capillary: 224 mg/dL — ABNORMAL HIGH (ref 70–99)
Glucose-Capillary: 312 mg/dL — ABNORMAL HIGH (ref 70–99)

## 2021-06-01 LAB — LIPASE, BLOOD: Lipase: 70 U/L — ABNORMAL HIGH (ref 11–51)

## 2021-06-01 LAB — LACTIC ACID, PLASMA: Lactic Acid, Venous: 2.4 mmol/L (ref 0.5–1.9)

## 2021-06-01 LAB — HEMOGLOBIN A1C
Hgb A1c MFr Bld: 14.8 % — ABNORMAL HIGH (ref 4.8–5.6)
Mean Plasma Glucose: 378.06 mg/dL

## 2021-06-01 LAB — PHOSPHORUS: Phosphorus: 2.6 mg/dL (ref 2.5–4.6)

## 2021-06-01 LAB — AMMONIA: Ammonia: 39 umol/L — ABNORMAL HIGH (ref 9–35)

## 2021-06-01 LAB — BETA-HYDROXYBUTYRIC ACID
Beta-Hydroxybutyric Acid: 7.78 mmol/L — ABNORMAL HIGH (ref 0.05–0.27)
Beta-Hydroxybutyric Acid: 3.62 mmol/L — ABNORMAL HIGH (ref 0.05–0.27)

## 2021-06-01 MED ORDER — SODIUM CHLORIDE 0.9 % IV SOLN
1.0000 g | INTRAVENOUS | Status: DC
  Administered 2021-06-01 – 2021-06-03 (×3): 1 g via INTRAVENOUS
  Filled 2021-06-01 (×3): qty 1

## 2021-06-01 MED ORDER — LEVOTHYROXINE SODIUM 100 MCG/5ML IV SOLN
100.0000 ug | Freq: Every day | INTRAVENOUS | Status: DC
  Administered 2021-06-01 – 2021-06-04 (×4): 100 ug via INTRAVENOUS
  Filled 2021-06-01 (×4): qty 5

## 2021-06-01 MED ORDER — INSULIN STARTER KIT- PEN NEEDLES (ENGLISH)
1.0000 | Freq: Once | Status: DC
  Filled 2021-06-01: qty 1

## 2021-06-01 MED ORDER — INSULIN ASPART 100 UNIT/ML IJ SOLN
4.0000 [IU] | Freq: Once | INTRAMUSCULAR | Status: AC
  Administered 2021-06-01: 4 [IU] via SUBCUTANEOUS

## 2021-06-01 MED ORDER — INSULIN GLARGINE-YFGN 100 UNIT/ML ~~LOC~~ SOLN
20.0000 [IU] | Freq: Every day | SUBCUTANEOUS | Status: DC
  Administered 2021-06-01: 20 [IU] via SUBCUTANEOUS
  Filled 2021-06-01 (×2): qty 0.2

## 2021-06-01 MED ORDER — INSULIN ASPART 100 UNIT/ML IJ SOLN
0.0000 [IU] | INTRAMUSCULAR | Status: DC
  Administered 2021-06-02: 9 [IU] via SUBCUTANEOUS

## 2021-06-01 MED ORDER — LIVING WELL WITH DIABETES BOOK
Freq: Once | Status: DC
  Filled 2021-06-01: qty 1

## 2021-06-01 MED ORDER — ENOXAPARIN SODIUM 80 MG/0.8ML IJ SOSY
0.5000 mg/kg | PREFILLED_SYRINGE | INTRAMUSCULAR | Status: DC
  Administered 2021-06-01 – 2021-06-06 (×6): 75 mg via SUBCUTANEOUS
  Filled 2021-06-01 (×6): qty 0.8

## 2021-06-01 MED ORDER — INSULIN GLARGINE-YFGN 100 UNIT/ML ~~LOC~~ SOLN
15.0000 [IU] | Freq: Every day | SUBCUTANEOUS | Status: DC
  Filled 2021-06-01: qty 0.15

## 2021-06-01 MED ORDER — INSULIN ASPART 100 UNIT/ML IJ SOLN: 0.0000 [IU] | Freq: Three times a day (TID) | INTRAMUSCULAR | Status: DC

## 2021-06-01 MED ORDER — SODIUM CHLORIDE 0.9 % IV SOLN: INTRAVENOUS | Status: DC

## 2021-06-01 MED ORDER — BISACODYL 10 MG RE SUPP
10.0000 mg | Freq: Once | RECTAL | Status: AC
  Administered 2021-06-01: 10 mg via RECTAL
  Filled 2021-06-01: qty 1

## 2021-06-01 NOTE — Consult Note (Signed)
NAME:  Vanessa [name], MRN:  [id number], DOB:  August 05, 1971, LOS: 1 ADMISSION DATE:  05/31/2021, CONSULTATION DATE:  7/30 REFERRING MD:  Sunnie Nielsenegalado, CHIEF COMPLAINT:  altered mental status    History of Present Illness:  50 year old female w/ hx as per below. Admitted 7/29 after being found by daughter w/ AMS. Reportedly having intermittent vomiting over ~ 2 week time frame w/ diffuse abd pain.  In ER dx eval c/w DKA: gluc 621, + anion gap , beta-hydroxybutyric acid > 8. Started on insulin gtt. IVFs given. Admitted to hospital. [facility] asked PCCM asked to see 7/30 given concern may need ICU care   Pertinent  Medical History  Morbid obesity, OSA  (cpap non-compliant), lumbar spinal stenosis, pre-diabetes   Significant Hospital Events: Including procedures, antibiotic start and stop dates in addition to other pertinent events   7/29  ER dx eval c/w DKA: gluc 621, + anion gap , beta-hydroxybutyric acid > 8. Started on insulin gtt. IVFs given. Admitted to hospital. 7/30 PCCM asked to see given h/o being altered, and primary team wondering about need for ICU; on ICU team arrival. Patient. Lying on side. Awake. Alert. Oriented.  Interim History / Subjective:  Feels a little better.   Objective   Blood pressure 117/70, pulse 67, temperature 97.8 F (36.6 C), temperature source Oral, resp. rate 19, height 5\' 5"  (1.651 m), weight (Abnormal) 152.3 kg, SpO2 98 %.        Intake/Output Summary (Last 24 hours) at 06/01/2021 1218 Last data filed at 06/01/2021 1002 Gross per 24 hour  Intake 4265.79 ml  Output 1100 ml  Net 3165.79 ml   Filed Weights   05/31/21 1707 05/31/21 2246  Weight: (Abnormal) 201.9 kg (Abnormal) 152.3 kg    Examination: General: 50 year old aaf resting in bed. She is in no distress  HENT: NCAT no JVD MMM Lungs: clear no accessory use  Cardiovascular: RRR no MRG  Abdomen: soft not tender  Extremities: warm and dry  Neuro: awake and alert  GU: voids   Resolved Hospital  Problem list     Assessment & Plan:  Acute metabolic Encephalopathy 2/2 DKA (resolved)  Plan Cont supportive care Hold narcotics  Diabetes w/ DKA-->now her gap resolved. Remains hyperglycemic -beta-hydroxybutyric acid trending down -anion gap closed. Acid base normalized  Plan Cont hyperglycemia protocol   AKI  Plan Cont IVFs F/u chem  Hypothyroidism  Plan Per IM service  Getting IV synthroid   Mild pancreatitis -Lipase trending down  Plan Monitor   Best Practice (right click and "Reselect all SmartList Selections" daily)   Diet/type: NPO w/ oral meds DVT prophylaxis: LMWH GI prophylaxis: N/A Lines: N/A Foley:  N/A Code Status:  full code Last date of multidisciplinary goals of care discussion [NA]  Labs   CBC: Recent Labs  Lab 05/31/21 1706 05/31/21 1725 05/31/21 1727 06/01/21 0546  WBC 12.1*  --   --  10.0  NEUTROABS 8.5*  --   --   --   HGB 14.3 16.0* 16.0* 12.4  HCT 45.3 47.0* 47.0* 39.3  MCV 87.1  --   --  85.1  PLT 271  --   --  216    Basic Metabolic Panel: Recent Labs  Lab 05/31/21 1706 05/31/21 1725 05/31/21 1727 05/31/21 1840 06/01/21 0041 06/01/21 0546 06/01/21 0806  NA 134*   < > 134* 135 142 142 144  K 5.2*   < > 5.3* 5.5* 4.2 4.2 4.0  CL 92*  --  102 92* 106 106 108  CO2 9*  --   --  13* 16* 21* 23  GLUCOSE 608*  --  641* 606* 257* 234* 234*  BUN 44*  --  47* 44* 33* 29* 25*  CREATININE 2.34*  --  1.60* 2.36* 1.90* 1.50* 1.52*  CALCIUM 9.3  --   --  9.3 8.9 8.9 8.9  MG  --   --   --   --   --  2.5*  --   PHOS  --   --   --   --   --  2.6  --    < > = values in this interval not displayed.   GFR: Estimated Creatinine Clearance: 67.2 mL/min (A) (by C-G formula based on SCr of 1.52 mg/dL (H)). Recent Labs  Lab 05/31/21 1706 05/31/21 1713 06/01/21 0041 06/01/21 0546  WBC 12.1*  --   --  10.0  LATICACIDVEN  --  2.3* 2.4*  --     Liver Function Tests: Recent Labs  Lab 05/31/21 1706 06/01/21 0546  AST 24 23  ALT 33  28  ALKPHOS 97 83  BILITOT 3.0* 2.0*  PROT 9.0* 7.8  ALBUMIN 3.9 3.3*   Recent Labs  Lab 05/31/21 1706 06/01/21 0947  LIPASE 100* 70*   Recent Labs  Lab 06/01/21 0947  AMMONIA 39*    ABG    Component Value Date/Time   PHART 7.393 06/01/2021 1025   PCO2ART 39.8 06/01/2021 1025   PO2ART 86.7 06/01/2021 1025   HCO3 23.7 06/01/2021 1025   TCO2 13 (L) 05/31/2021 1727   ACIDBASEDEF 0.5 06/01/2021 1025   O2SAT 96.3 06/01/2021 1025     Coagulation Profile: No results for input(s): INR, PROTIME in the last 168 hours.  Cardiac Enzymes: No results for input(s): CKTOTAL, CKMB, CKMBINDEX, TROPONINI in the last 168 hours.  HbA1C: Hgb A1c MFr Bld  Date/Time Value Ref Range Status  05/16/2019 03:09 PM 6.3 4.6 - 6.5 % Final    Comment:    Glycemic Control Guidelines for People with Diabetes:Non Diabetic:  <6%Goal of Therapy: <7%Additional Action Suggested:  >8%   04/01/2018 09:25 AM 6.6 (H) 4.6 - 6.5 % Final    Comment:    Glycemic Control Guidelines for People with Diabetes:Non Diabetic:  <6%Goal of Therapy: <7%Additional Action Suggested:  >8%     CBG: Recent Labs  Lab 06/01/21 0400 06/01/21 0504 06/01/21 0636 06/01/21 0954 06/01/21 1214  GLUCAP 190* 251* 251* 246* 196*    Review of Systems:   Review of Systems  Constitutional: Negative.   HENT: Negative.    Eyes: Negative.   Respiratory: Negative.    Cardiovascular: Negative.   Gastrointestinal:  Positive for nausea.  Genitourinary: Negative.   Musculoskeletal: Negative.   Skin: Negative.   Neurological: Negative.   Endo/Heme/Allergies: Negative.   Psychiatric/Behavioral: Negative.     Past Medical History:  She,  has a past medical history of Anemia, Anxiety, Arthritis, Asthma, Chronic lower back pain, Complication of anesthesia, Headache, Headache, Heart murmur, History of bronchitis, Hypertension, Lumbar disc disease (05/10/2013), OSA (obstructive sleep apnea) (02/23/2018), Sciatica, Sciatica, Seasonal  allergies, Thyroid disease, and Unspecified hypothyroidism (07/29/2013).   Surgical History:   Past Surgical History:  Procedure Laterality Date   CARPAL TUNNEL RELEASE Right 2005   "inside"   CARPAL TUNNEL RELEASE Right 2008   "outside"   CARPAL TUNNEL RELEASE Left 07/03/2016   Procedure: LEFT CARPAL TUNNEL RELEASE;  Surgeon: Kathryne Hitch, MD;  Location: [facility] OR;  Service: Orthopedics;  Laterality: Left;   CESAREAN SECTION  1994   DILATION AND CURETTAGE OF UTERUS  1998   "miscarriage"   HYSTEROSCOPY WITH D & C N/A 10/10/2016   Procedure: DILATATION AND CURETTAGE /HYSTEROSCOPY;  Surgeon: Edwinna Areola, DO;  Location: [facility];  Service: Gynecology;  Laterality: N/A;   RADIOLOGY WITH ANESTHESIA N/A 01/12/2018   Procedure: MRI OF LUMBAR SPINE WITHOUT CONTRAST;  Surgeon: Radiologist, Medication, MD;  Location: [facility] OR;  Service: Radiology;  Laterality: N/A;   RADIOLOGY WITH ANESTHESIA N/A 07/05/2019   Procedure: MRI WITH ANESTHESIA LUMBAR SPINE WITHOUT;  Surgeon: Radiologist, Medication, MD;  Location: [facility] OR;  Service: Radiology;  Laterality: N/A;   SHOULDER ARTHROSCOPY WITH ROTATOR CUFF REPAIR AND SUBACROMIAL DECOMPRESSION Right 02/27/2015   Procedure: RIGHT SHOULDER ARTHROSCOPY WITH ROTATOR CUFF REPAIR AND SUBACROMIAL DECOMPRESSION;  Surgeon: Kathryne Hitch, MD;  Location: [facility] OR;  Service: Orthopedics;  Laterality: Right;   SHOULDER OPEN ROTATOR CUFF REPAIR Right 2003   TOTAL THYROIDECTOMY  2012     Social History:   reports that she has never smoked. She has never used smokeless tobacco. She reports that she does not drink alcohol and does not use drugs.   Family History:  Her family history includes Asthma in her mother; Heart disease in her mother; Hypertension in an other family member.   Allergies Allergies  Allergen Reactions   Ibuprofen Other (See Comments)    CAUSES BLEEDING   Influenza Vaccines Shortness Of Breath     Home Medications  Prior to Admission  medications   Medication Sig Start Date End Date Taking? Authorizing Provider  albuterol (VENTOLIN HFA) 108 (90 Base) MCG/ACT inhaler Inhale 2 puffs into the lungs every 6 (six) hours as needed for wheezing or shortness of breath. 10/19/19   Corwin Levins, MD  ALPRAZolam Prudy Feeler) 0.5 MG tablet Take 1 tablet (0.5 mg total) by mouth 2 (two) times daily as needed for anxiety. 07/06/20   Corwin Levins, MD  beclomethasone (QVAR) 80 MCG/ACT inhaler INHALE 1 PUFF INTO THE LUNGS Daily Patient taking differently: Inhale 1 puff into the lungs daily. 10/19/19   Corwin Levins, MD  celecoxib (CELEBREX) 200 MG capsule Take 1 capsule (200 mg total) by mouth 2 (two) times daily after a meal. 10/19/19   Corwin Levins, MD  cetirizine (ZYRTEC) 10 MG tablet Take 1 tablet (10 mg total) by mouth daily. 06/23/19 06/22/20  Corwin Levins, MD  cholecalciferol (VITAMIN D) 1000 units tablet Take 1,000 Units by mouth daily.    [provider]  citalopram (CELEXA) 20 MG tablet Take 1 tablet (20 mg total) by mouth daily. 07/06/20   Corwin Levins, MD  cyclobenzaprine (FLEXERIL) 10 MG tablet TAKE 1 TABLET BY MOUTH 3 TIMES DAILY AS NEEDED FOR MUSCLE SPASMS. Patient taking differently: Take 10 mg by mouth 3 (three) times daily as needed for muscle spasms. 11/20/20   Kerrin Champagne, MD  diclofenac (VOLTAREN) 50 MG EC tablet Take 1 tablet (50 mg total) by mouth 3 (three) times daily. 10/19/19   Corwin Levins, MD  doxycycline (VIBRA-TABS) 100 MG tablet Take 1 tablet (100 mg total) by mouth 2 (two) times daily. 02/22/21   Burns, Bobette Mo, MD  fluticasone (CUTIVATE) 0.05 % cream APPLY TO SKIN TWICE DAILY Patient taking differently: Apply 1 application topically 2 (two) times daily. 10/19/19   Corwin Levins, MD  gabapentin (NEURONTIN) 300 MG capsule Take 1 capsule (300 mg total) by mouth  2 (two) times daily. 10/19/19   Corwin Levins, MD  guaiFENesin (MUCINEX) 600 MG 12 hr tablet Take 2 tablets (1,200 mg total) by mouth 2 (two) times daily  as needed. 04/02/21   Corwin Levins, MD  hydrochlorothiazide (HYDRODIURIL) 25 MG tablet Take 1 tablet (25 mg total) by mouth daily. 07/13/20   Corwin Levins, MD  HYDROcodone-homatropine [facility]) 5-1.5 MG/5ML syrup Take 5 mLs by mouth every 8 (eight) hours as needed for cough. 02/22/21   Pincus Sanes, MD  levothyroxine (SYNTHROID) 200 MCG tablet TAKE 1 TABLET BY MOUTH DAILY BEFORE BREAKFAST Patient taking differently: Take 200 mcg by mouth daily before breakfast. 07/13/20   Corwin Levins, MD  lidocaine (LIDODERM) 5 % Place 1 patch onto the skin daily as needed (for pain). Remove & Discard patch within 12 hours or as directed by MD 02/01/20   Magnant, Joycie Peek, PA-C  losartan (COZAAR) 100 MG tablet Take 1 tablet (100 mg total) by mouth daily. 07/13/20   Corwin Levins, MD  montelukast (SINGULAIR) 10 MG tablet Take 1 tablet (10 mg total) by mouth daily. 04/02/21   Corwin Levins, MD  naproxen (NAPROSYN) 500 MG tablet TAKE 1 TABLET BY MOUTH 2 TIMES DAILY WITH A MEAL. Patient taking differently: Take 500 mg by mouth 2 (two) times daily with a meal. 07/29/19   Kathryne Hitch, MD  predniSONE (DELTASONE) 10 MG tablet 3 tabs by mouth per day for 3 days,2tabs per day for 3 days,1tab per day for 3 days Patient taking differently: Take 10 mg by mouth See admin instructions. 3 tabs by mouth per day for 3 days,2tabs per day for 3 days,1tab per day for 3 days 04/17/21   Corwin Levins, MD  SUMAtriptan (IMITREX) 100 MG tablet TAKE 1 TABLET BY MOUTH AS NEEDED FOR MIGRAINE OR HEADACHE.* MAY REPEAT IN 2 HOURS IF NEEDED Patient taking differently: Take 100 mg by mouth See admin instructions. TAKE 1 TABLET BY MOUTH AS NEEDED FOR MIGRAINE OR HEADACHE.* MAY REPEAT IN 2 HOURS IF NEEDED 07/06/20   Corwin Levins, MD  traMADol (ULTRAM) 50 MG tablet Take 1-2 tablets (50-100 mg total) by mouth every 6 (six) hours as needed. Patient taking differently: Take 50-100 mg by mouth every 6 (six) hours as needed for moderate pain. 07/01/19    Kathryne Hitch, MD  tranexamic acid (LYSTEDA) 650 MG TABS tablet Take 650 mg by mouth daily.    [provider]  triamcinolone (NASACORT) 55 MCG/ACT AERO nasal inhaler Place 2 sprays into the nose daily. 10/19/19   Corwin Levins, MD     Critical care time: NA    Simonne Martinet ACNP-BC [facility] Pulmonary/Critical Care Pager # [phone] OR # [phone] if no answer

## 2021-06-01 NOTE — Progress Notes (Signed)
PROGRESS NOTE    Vanessa [name]  SJG:[id number] DOB: 03-26-1971 DOA: 05/31/2021 PCP: Biagio Borg, MD   Brief Narrative: 50 year old with past medical history significant for lumbar spinal stenosis, morbid obesity, hypothyroidism, OSA, noncompliant with CPAP, prediabetic who presented to the ED due to altered mental status.  Last known well was on Wednesday afternoon.  Patient has been vomiting intermittently for the last 2 weeks, associated with abdominal pain.  Evaluation in the ED patient was found to be in DKA, CBG 621, bicarb 9, anion gap of 33, beta hydroxybutyrate greater than 8 pH 7.2.    Assessment & Plan:   Active Problems:   DKA, type 2 ([place])   1-DKA type II: Anion gap metabolic acidosis Patient has a history of prediabetes A1c of 6.0 on 05/16/2019 Presented with metabolic acidosis, bicarb of 9 anion gap of 33, beta hydroxybutyrate greater than 8, CBG 621. He was a started on insulin drip, IV fluids. Gap is closed her bicarb is more than 21 CBG less than 200. Plan to proceed to transition to long-acting insulin, will start with low-dose because she wont be eating.  Plan to  start sliding scale insulin UA with pyuria, per daughter patient reporter dysuria yesterday.  Plan to start empirically ceftriaxone.  Send urine culture. Plan to check chest x-ray.  2-Acute metabolic encephalopathy: Secondary to DKA, acidosis, uncontrolled hypothyroidism. TSH at 60 Started on levothyroxine Synthroid, continue with 100 mcg daily She is still very lethargic this morning for this reason blood gas was repeated which was improved. I have consulted CCM, due to her altered mental status If No improvement in 2 to 3 days will need MRI of the brain  3-uncontrolled hypothyroidism: TSH greater than 60. Prior to admission levothyroxine 200 mcg daily. Increase Synthroid to 100 mcg daily IV>   4-UTI: UA with pyuria, reported dysuria. Start ceftriaxone  DKA: Likely related to dehydration,  DKA: Improving with IV fluids. Threatening peak to 2.3  Hyperkalemia: In the setting of AKI, and BKA.  Improved  Pancreatitis: Lipase elevated CT abdomen and pelvis showed inflammation in the pancreas No Calcified gallstone. Fluids, avoid narcotics due to altered mental status.  HTN;  Agree with holding hydrochlorothiazide and losartan in the setting of AKI Depression anxiety continue to hold medication  Hepatic steatosis by CT scan: She will need MRI once stable as an outpatient  Estimated body mass index is 55.87 kg/m as calculated from the following:   Height as of this encounter: $RemoveBeforeD'5\' 5"'qPvzXZiQlKidPl$  (1.651 m).   Weight as of this encounter: 152.3 kg.   DVT prophylaxis: Lovenox Code Status: Full Code Family Communication: daughter who was at bedside.  Disposition Plan:  Status is: Inpatient  Remains inpatient appropriate because:IV treatments appropriate due to intensity of illness or inability to take PO  Dispo:  Patient From: Home  Planned Disposition: Home  Medically stable for discharge: No          Consultants:  CCM  Procedures:  None  Antimicrobials:  Ceftriaxone.   Subjective: She is lethargic, obtunded, would answer few questions. She report abdominal pain. No further vomiting here. She complained of dysuria to daughter yesterday in the ED>    Objective: Vitals:   05/31/21 2246 05/31/21 2300 06/01/21 0356 06/01/21 0747  BP:  137/88 110/68 117/70  Pulse:  76 81 67  Resp:   20 19  Temp:   98.1 F (36.7 C) 97.8 F (36.6 C)  TempSrc:   Oral Oral  SpO2:  98% 98%  98%  Weight: (!) 152.3 kg     Height: $Remove'5\' 5"'yFpXAyI$  (1.651 m)       Intake/Output Summary (Last 24 hours) at 06/01/2021 1528 Last data filed at 06/01/2021 1002 Gross per 24 hour  Intake 4265.79 ml  Output 1100 ml  Net 3165.79 ml   Filed Weights   05/31/21 1707 05/31/21 2246  Weight: (!) 201.9 kg (!) 152.3 kg    Examination:  General exam: Appears calm and comfortable , lethargic.  Respiratory  system: Clear to auscultation. Respiratory effort normal. Cardiovascular system: S1 & S2 heard, RRR. No JVD, murmurs, rubs, gallops or clicks. No pedal edema. Gastrointestinal system: Abdomen is nondistended, soft and tender mid abdomen, Obese Central nervous system: Lethargic , would answer question slowly.  Extremities: Symmetric 5 x 5 power. Skin: No rashes, lesions or ulcers    Data Reviewed: I have personally reviewed following labs and imaging studies  CBC: Recent Labs  Lab 05/31/21 1706 05/31/21 1725 05/31/21 1727 06/01/21 0546  WBC 12.1*  --   --  10.0  NEUTROABS 8.5*  --   --   --   HGB 14.3 16.0* 16.0* 12.4  HCT 45.3 47.0* 47.0* 39.3  MCV 87.1  --   --  85.1  PLT 271  --   --  423   Basic Metabolic Panel: Recent Labs  Lab 05/31/21 1840 06/01/21 0041 06/01/21 0546 06/01/21 0806 06/01/21 1432  NA 135 142 142 144 144  K 5.5* 4.2 4.2 4.0 4.0  CL 92* 106 106 108 107  CO2 13* 16* 21* 23 23  GLUCOSE 606* 257* 234* 234* 221*  BUN 44* 33* 29* 25* 18  CREATININE 2.36* 1.90* 1.50* 1.52* 1.32*  CALCIUM 9.3 8.9 8.9 8.9 9.0  MG  --   --  2.5*  --   --   PHOS  --   --  2.6  --   --    GFR: Estimated Creatinine Clearance: 77.4 mL/min (A) (by C-G formula based on SCr of 1.32 mg/dL (H)). Liver Function Tests: Recent Labs  Lab 05/31/21 1706 06/01/21 0546  AST 24 23  ALT 33 28  ALKPHOS 97 83  BILITOT 3.0* 2.0*  PROT 9.0* 7.8  ALBUMIN 3.9 3.3*   Recent Labs  Lab 05/31/21 1706 06/01/21 0947  LIPASE 100* 70*   Recent Labs  Lab 06/01/21 0947  AMMONIA 39*   Coagulation Profile: No results for input(s): INR, PROTIME in the last 168 hours. Cardiac Enzymes: No results for input(s): CKTOTAL, CKMB, CKMBINDEX, TROPONINI in the last 168 hours. BNP (last 3 results) No results for input(s): PROBNP in the last 8760 hours. HbA1C: No results for input(s): HGBA1C in the last 72 hours. CBG: Recent Labs  Lab 06/01/21 0504 06/01/21 0636 06/01/21 0954 06/01/21 1153  06/01/21 1214  GLUCAP 251* 251* 246* 210* 196*   Lipid Profile: No results for input(s): CHOL, HDL, LDLCALC, TRIG, CHOLHDL, LDLDIRECT in the last 72 hours. Thyroid Function Tests: Recent Labs    05/31/21 1854  TSH 60.161*   Anemia Panel: No results for input(s): VITAMINB12, FOLATE, FERRITIN, TIBC, IRON, RETICCTPCT in the last 72 hours. Sepsis Labs: Recent Labs  Lab 05/31/21 1713 06/01/21 0041  LATICACIDVEN 2.3* 2.4*    Recent Results (from the past 240 hour(s))  Resp Panel by RT-PCR (Flu A&B, Covid) Nasopharyngeal Swab     Status: None   Collection Time: 05/31/21  5:34 PM   Specimen: Nasopharyngeal Swab; Nasopharyngeal(NP) swabs in vial transport medium  Result Value Ref  Range Status   SARS Coronavirus 2 by RT PCR NEGATIVE NEGATIVE Final    Comment: (NOTE) SARS-CoV-2 target nucleic acids are NOT DETECTED.  The SARS-CoV-2 RNA is generally detectable in upper respiratory specimens during the acute phase of infection. The lowest concentration of SARS-CoV-2 viral copies this assay can detect is 138 copies/mL. A negative result does not preclude SARS-Cov-2 infection and should not be used as the sole basis for treatment or other patient management decisions. A negative result may occur with  improper specimen collection/handling, submission of specimen other than nasopharyngeal swab, presence of viral mutation(s) within the areas targeted by this assay, and inadequate number of viral copies(<138 copies/mL). A negative result must be combined with clinical observations, patient history, and epidemiological information. The expected result is Negative.  Fact Sheet for Patients:  EntrepreneurPulse.com.au  Fact Sheet for Healthcare Providers:  IncredibleEmployment.be  This test is no t yet approved or cleared by the [place] FDA and  has been authorized for detection and/or diagnosis of SARS-CoV-2 by FDA under an Emergency Use  Authorization (EUA). This EUA will remain  in effect (meaning this test can be used) for the duration of the COVID-19 declaration under Section 564(b)(1) of the Act, 21 U.S.C.section 360bbb-3(b)(1), unless the authorization is terminated  or revoked sooner.       Influenza A by PCR NEGATIVE NEGATIVE Final   Influenza B by PCR NEGATIVE NEGATIVE Final    Comment: (NOTE) The Xpert Xpress SARS-CoV-2/FLU/RSV plus assay is intended as an aid in the diagnosis of influenza from Nasopharyngeal swab specimens and should not be used as a sole basis for treatment. Nasal washings and aspirates are unacceptable for Xpert Xpress SARS-CoV-2/FLU/RSV testing.  Fact Sheet for Patients: EntrepreneurPulse.com.au  Fact Sheet for Healthcare Providers: IncredibleEmployment.be  This test is not yet approved or cleared by the [place] FDA and has been authorized for detection and/or diagnosis of SARS-CoV-2 by FDA under an Emergency Use Authorization (EUA). This EUA will remain in effect (meaning this test can be used) for the duration of the COVID-19 declaration under Section 564(b)(1) of the Act, 21 U.S.C. section 360bbb-3(b)(1), unless the authorization is terminated or revoked.  Performed at [facility], [place] [address] [place], [place] [address]          Radiology Studies: CT ABDOMEN PELVIS WO CONTRAST  Result Date: 05/31/2021 CLINICAL DATA:  Nausea and vomiting.  Epigastric pain. EXAM: CT ABDOMEN AND PELVIS WITHOUT CONTRAST TECHNIQUE: Multidetector CT imaging of the abdomen and pelvis was performed following the standard protocol without IV contrast. COMPARISON:  None. FINDINGS: Lower chest: No focal airspace disease or pleural effusion, mild motion artifact limitations. Heart is normal in size. Hepatobiliary: Diffusely decreased hepatic density consistent with steatosis. Geographic area of decreased density in the central liver abutting the  falciform ligament is favored to be geographic steatosis but technically indeterminate. Gallbladder is difficult to delineate due to similar density to the adjacent liver, possible intraluminal sludge. No calcified gallstone. No biliary dilatation. Pancreas: Suggestion of mild edema adjacent to the pancreatic head, for example series 4, image 44. No pancreatic ductal dilatation. No peripancreatic collection. Spleen: Normal in size without focal abnormality. Adrenals/Urinary Tract: Low-density left adrenal nodule consistent with adenoma. Normal right adrenal gland. No hydronephrosis. No visualized renal calculi. Motion artifact through the lower right kidney. No evidence of focal renal lesion. Unremarkable urinary bladder. Stomach/Bowel: Small hiatal hernia. The stomach is decompressed. No small bowel obstruction. No small bowel inflammation. Appendix is not definitively  identified, no appendicitis. Small volume of colonic stool. Occasional left colonic diverticula without diverticulitis. No colonic wall thickening or inflammatory change. Vascular/Lymphatic: Normal caliber abdominal aorta. No portal venous or mesenteric gas. No abdominopelvic adenopathy. Reproductive: Uterus and bilateral adnexa are unremarkable. Other: No free air, free fluid, or intra-abdominal fluid collection. Small fat containing umbilical hernia. Musculoskeletal: Degenerative change in the spine and both sacroiliac joints. There are no acute or suspicious osseous abnormalities. IMPRESSION: 1. Suggestion of mild edema adjacent to the pancreatic head, suspicious for acute pancreatitis. Recommend correlation with pancreatic enzymes. 2. Hepatic steatosis. Geographic area of decreased density in the central liver abutting the falciform ligament is favored to be geographic steatosis but technically indeterminate. Recommend nonemergent hepatic protocol MRI for characterization on an elective basis. 3. Small hiatal hernia. Small fat containing  umbilical hernia. 4. Left adrenal adenoma. Electronically Signed   By: Keith Rake M.D.   On: 05/31/2021 22:42   CT Head Wo Contrast  Result Date: 05/31/2021 CLINICAL DATA:  Altered mental status EXAM: CT HEAD WITHOUT CONTRAST TECHNIQUE: Contiguous axial images were obtained from the base of the skull through the vertex without intravenous contrast. COMPARISON:  08/04/2014 FINDINGS: Brain: The cerebellar tonsils are low lying, similar to prior examination. No abnormal intra or extra-axial mass lesion or fluid collection. No abnormal mass effect or midline shift. No evidence of acute intracranial hemorrhage or infarct. Ventricular size is normal. Cerebellum unremarkable. Vascular: Unremarkable Skull: Intact Sinuses/Orbits: Paranasal sinuses are clear. Orbits are unremarkable. Other: Mastoid air cells and middle ear cavities are clear. IMPRESSION: No acute intracranial abnormality. Low lying cerebellar tonsils, unchanged from prior examination. Electronically Signed   By: Fidela Salisbury MD   On: 05/31/2021 22:40   DG CHEST PORT 1 VIEW  Result Date: 06/01/2021 CLINICAL DATA:  Altered mental status since 07/27. EXAM: PORTABLE CHEST 1 VIEW COMPARISON:  08/04/2014 FINDINGS: Midline trachea. Mild cardiomegaly. Mediastinal contours otherwise within normal limits. No pleural effusion or pneumothorax. Clear lungs. No congestive failure. IMPRESSION: Cardiomegaly without congestive failure. Electronically Signed   By: Abigail Miyamoto M.D.   On: 06/01/2021 11:54        Scheduled Meds:  bisacodyl  10 mg Rectal Once   budesonide  0.5 mg Inhalation BID   enoxaparin (LOVENOX) injection  0.5 mg/kg Subcutaneous Q24H   insulin aspart  0-9 Units Subcutaneous TID WC   insulin glargine-yfgn  15 Units Subcutaneous Daily   insulin starter kit- pen needles  1 kit Other Once   levothyroxine  100 mcg Intravenous Daily   living well with diabetes book   Does not apply Once   montelukast  10 mg Oral Daily   Continuous  Infusions:  cefTRIAXone (ROCEPHIN)  IV 1 g (06/01/21 1000)   dextrose 5% lactated ringers 125 mL/hr at 06/01/21 0357   insulin 6.5 Units/hr (06/01/21 1214)   lactated ringers Stopped (06/01/21 0357)     LOS: 1 day    Time spent: 35 minutes.     Elmarie Shiley, MD [facility]   If 7PM-7AM, please contact night-coverage [email or web address]  06/01/2021, 3:28 PM

## 2021-06-01 NOTE — Progress Notes (Signed)
Inpatient Diabetes Program Recommendations  AACE/ADA: New Consensus Statement on Inpatient Glycemic Control (2015)  Target Ranges:  Prepandial:   less than 140 mg/dL      Peak postprandial:   less than 180 mg/dL (1-2 hours)      Critically ill patients:  140 - 180 mg/dL   Lab Results  Component Value Date   GLUCAP 196 (H) 06/01/2021   HGBA1C 6.3 05/16/2019    Review of Glycemic Control Results for Vanessa, [name] (MRN [id number]) as of 06/01/2021 12:24  Ref. Range 06/01/2021 09:54 06/01/2021 11:53 06/01/2021 12:14  Glucose-Capillary Latest Ref Range: 70 - 99 mg/dL 246 (H) 210 (H) 196 (H)   Diabetes history: Pre-diabetes Outpatient Diabetes medications: None-Prednisone taper ordered in May, 2022 and then reordered on 04/17/21 Current orders for Inpatient glycemic control:  IV insulin  Inpatient Diabetes Program Recommendations:    Patient continues on insulin drip.   A1C pending.  ? Hyperglycemia has been influenced by recent Prednisone tapersx2.   Once patient is reading for transition off insulin drip, consider glargine-yfgn (Semglee) 35 units 2 hours prior to d/c of insulin drip.  Also consider Novolog moderate tid with meals and Novolog meal coverage 4 units tid with meals.  It appears patient will need insulin for DM at d/c.  Will order LWWD booklet, insulin starter kit, and insulin/DM teaching by bedside RN.  Will talk to patient when appropriate.   Thanks  Adah Perl, RN, BC-ADM Inpatient Diabetes Coordinator Pager [phone] (8a-5p)

## 2021-06-02 LAB — GLUCOSE, CAPILLARY
Glucose-Capillary: 255 mg/dL — ABNORMAL HIGH (ref 70–99)
Glucose-Capillary: 394 mg/dL — ABNORMAL HIGH (ref 70–99)
Glucose-Capillary: 379 mg/dL — ABNORMAL HIGH (ref 70–99)
Glucose-Capillary: 347 mg/dL — ABNORMAL HIGH (ref 70–99)
Glucose-Capillary: 311 mg/dL — ABNORMAL HIGH (ref 70–99)
Glucose-Capillary: 305 mg/dL — ABNORMAL HIGH (ref 70–99)
Glucose-Capillary: 347 mg/dL — ABNORMAL HIGH (ref 70–99)
Glucose-Capillary: 352 mg/dL — ABNORMAL HIGH (ref 70–99)

## 2021-06-02 LAB — URINE CULTURE: Culture: <10000 — AB

## 2021-06-02 LAB — CBC
HCT: 39.2 % (ref 36.0–46.0)
Hemoglobin: 12.2 g/dL (ref 12.0–15.0)
MCH: 26.9 pg (ref 26.0–34.0)
MCHC: 31.1 g/dL (ref 30.0–36.0)
MCV: 86.5 fL (ref 80.0–100.0)
Platelets: 200 10*3/uL (ref 150–400)
RBC: 4.53 MIL/uL (ref 3.87–5.11)
RDW: 21.4 % — ABNORMAL HIGH (ref 11.5–15.5)
WBC: 7.4 10*3/uL (ref 4.0–10.5)
nRBC: 0 % (ref 0.0–0.2)

## 2021-06-02 LAB — BASIC METABOLIC PANEL
Anion gap: 16 — ABNORMAL HIGH (ref 5–15)
Anion gap: 13 (ref 5–15)
BUN: 19 mg/dL (ref 6–20)
BUN: 20 mg/dL (ref 6–20)
CO2: 21 mmol/L — ABNORMAL LOW (ref 22–32)
CO2: 21 mmol/L — ABNORMAL LOW (ref 22–32)
Calcium: 8.9 mg/dL (ref 8.9–10.3)
Calcium: 8.7 mg/dL — ABNORMAL LOW (ref 8.9–10.3)
Chloride: 106 mmol/L (ref 98–111)
Chloride: 108 mmol/L (ref 98–111)
Creatinine, Ser: 1.48 mg/dL — ABNORMAL HIGH (ref 0.44–1.00)
Creatinine, Ser: 1.55 mg/dL — ABNORMAL HIGH (ref 0.44–1.00)
GFR, Estimated: 43 mL/min — ABNORMAL LOW (ref >60)
GFR, Estimated: 41 mL/min — ABNORMAL LOW (ref >60)
Glucose, Bld: 363 mg/dL — ABNORMAL HIGH (ref 70–99)
Glucose, Bld: 365 mg/dL — ABNORMAL HIGH (ref 70–99)
Potassium: 4.2 mmol/L (ref 3.5–5.1)
Potassium: 4.2 mmol/L (ref 3.5–5.1)
Sodium: 143 mmol/L (ref 135–145)
Sodium: 142 mmol/L (ref 135–145)

## 2021-06-02 MED ORDER — INSULIN ASPART 100 UNIT/ML IJ SOLN
0.0000 [IU] | Freq: Three times a day (TID) | INTRAMUSCULAR | Status: DC
  Administered 2021-06-02 – 2021-06-03 (×6): 11 [IU] via SUBCUTANEOUS
  Administered 2021-06-04: 8 [IU] via SUBCUTANEOUS
  Administered 2021-06-04 (×2): 11 [IU] via SUBCUTANEOUS
  Administered 2021-06-05: 8 [IU] via SUBCUTANEOUS
  Administered 2021-06-05: 11 [IU] via SUBCUTANEOUS
  Administered 2021-06-05: 5 [IU] via SUBCUTANEOUS
  Administered 2021-06-06: 8 [IU] via SUBCUTANEOUS
  Administered 2021-06-06: 5 [IU] via SUBCUTANEOUS
  Administered 2021-06-06: 3 [IU] via SUBCUTANEOUS
  Administered 2021-06-07: 2 [IU] via SUBCUTANEOUS

## 2021-06-02 MED ORDER — INSULIN ASPART 100 UNIT/ML IJ SOLN
4.0000 [IU] | Freq: Three times a day (TID) | INTRAMUSCULAR | Status: DC
  Administered 2021-06-02 – 2021-06-03 (×3): 4 [IU] via SUBCUTANEOUS

## 2021-06-02 MED ORDER — INSULIN GLARGINE-YFGN 100 UNIT/ML ~~LOC~~ SOLN
30.0000 [IU] | Freq: Two times a day (BID) | SUBCUTANEOUS | Status: DC
  Administered 2021-06-02: 30 [IU] via SUBCUTANEOUS
  Filled 2021-06-02 (×2): qty 0.3

## 2021-06-02 MED ORDER — INSULIN GLARGINE-YFGN 100 UNIT/ML ~~LOC~~ SOLN
36.0000 [IU] | Freq: Two times a day (BID) | SUBCUTANEOUS | Status: DC
  Administered 2021-06-02: 36 [IU] via SUBCUTANEOUS
  Filled 2021-06-02 (×3): qty 0.36

## 2021-06-02 MED ORDER — BUDESONIDE 0.25 MG/2ML IN SUSP
0.2500 mg | Freq: Two times a day (BID) | RESPIRATORY_TRACT | Status: DC
  Administered 2021-06-02 – 2021-06-07 (×6): 0.25 mg via RESPIRATORY_TRACT
  Filled 2021-06-02 (×9): qty 2

## 2021-06-02 MED ORDER — INSULIN ASPART 100 UNIT/ML IJ SOLN
0.0000 [IU] | Freq: Every day | INTRAMUSCULAR | Status: DC
  Administered 2021-06-02: 5 [IU] via SUBCUTANEOUS
  Administered 2021-06-03 – 2021-06-04 (×2): 4 [IU] via SUBCUTANEOUS
  Administered 2021-06-05: 3 [IU] via SUBCUTANEOUS

## 2021-06-02 MED ORDER — INSULIN ASPART 100 UNIT/ML IJ SOLN: 0.0000 [IU] | Freq: Three times a day (TID) | INTRAMUSCULAR | Status: DC
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
## 2021-06-02 NOTE — Progress Notes (Signed)
Text paged on Call MD DR. Toniann Fail with BMEt results. Will continue to monitor.

## 2021-06-02 NOTE — Progress Notes (Signed)
DR. Toniann Fail was on the floor, BMET results notified in person. Notified MD pt's night time CBG was 312 with no bedtime coverage. New orders for insulin coverage and BMEt received. See MAR for new orders. Will continue to monitor.

## 2021-06-02 NOTE — Plan of Care (Signed)

## 2021-06-02 NOTE — Plan of Care (Signed)
Progressing, will continue to monitor.  

## 2021-06-02 NOTE — Progress Notes (Signed)
PROGRESS NOTE    GENEA RHEAUME  HQI:[id number] DOB: [birth year] DOA: 05/31/2021 PCP: Biagio Borg, MD   Brief Narrative: 50 year old with past medical history significant for lumbar spinal stenosis, morbid obesity, hypothyroidism, OSA, noncompliant with CPAP, prediabetic who presented to the ED due to altered mental status.  Last known well was on Wednesday afternoon.  Patient has been vomiting intermittently for the last 2 weeks, associated with abdominal pain.  Evaluation in the ED patient was found to be in DKA, CBG 621, bicarb 9, anion gap of 33, beta hydroxybutyrate greater than 8 pH 7.2.    Assessment & Plan:   Active Problems:   DKA, type 2 ([place])   1-DKA type II: Anion gap metabolic acidosis Patient has a history of prediabetes A1c of 6.0 on 05/16/2019 Presented with metabolic acidosis, bicarb of 9 anion gap of 33, beta hydroxybutyrate greater than 8, CBG 621. She was a started on insulin drip, IV fluids. Gap is closed her bicarb is more than 21 CBG less than 200. Now on insulin Gargline BID. SSI. Change scale to moderate. Will add meals coverage. Increase insulin to 35 units BID>  A1C at 14.   2-Acute metabolic encephalopathy: Secondary to DKA, acidosis, uncontrolled hypothyroidism. TSH at 60 Started on levothyroxine Synthroid, continue with 100 mcg daily Repeated Blood gas improved.  She is more alert today. She feels sluggish Improved.    3-Uncontrolled Hypothyroidism: TSH greater than 60. Prior to admission levothyroxine 200 mcg daily. Increase Synthroid to 100 mcg daily IV>  Plan to transition to oral synthroid tomorrow.   4-UTI: UA with pyuria, reported dysuria. Continue with ceftriaxone, day 2.  Follow urine culture.   Blurry Vision;  She report Blurry vision for 2 months.  CT head showed: Low lying cerebellar tonsils, unchanged from prior examination. Discussed with neurology, patient will need Ophthalmology evaluation out patient. If no eye pathology  found she will need follow up with Neurology for MRI brain and further evaluation of Chiari  malformation.   AKI: Likely related to dehydration, DKA: Improving with IV fluids. Creatinine peak to 2.3 Cr down to 1.5 today.   Hyperkalemia: In the setting of AKI, and BKA.  Improved  Pancreatitis: Lipase elevated CT abdomen and pelvis showed inflammation in the pancreas No Calcified gallstone. Fluids, avoid narcotics due to altered mental status. Denies abdominal pain. Plan to start diet.   HTN;  Agree with holding hydrochlorothiazide and losartan in the setting of AKI Depression anxiety:  continue to hold medication  Hepatic steatosis by CT scan: She will need MRI once stable as an outpatient  Estimated body mass index is 55.87 kg/m as calculated from the following:   Height as of this encounter: _0  (1.651 m).   Weight as of this encounter: 152.3 kg.   DVT prophylaxis: Lovenox Code Status: Full Code Family Communication: daughter who was at bedside.  Disposition Plan:  Status is: Inpatient  Remains inpatient appropriate because:IV treatments appropriate due to intensity of illness or inability to take PO  Dispo:  Patient From: Home  Planned Disposition: Home  Medically stable for discharge: No          Consultants:  CCM  Procedures:  None  Antimicrobials:  Ceftriaxone.   Subjective: Patient is more alert, and conversant today. She feels sluggish.  She only eats one meal a day. She drinks water, lately she was drinking coke due to frequent vomiting.      Objective: Vitals:   06/01/21 2008 06/01/21 2318 06/02/21  0334 06/02/21 0734  BP: 112/72 119/78 113/74 115/70  Pulse: 63 75 68 62  Resp: _0 Temp: 97.8 F (36.6 C) (!) 97.5 F (36.4 C) 97.9 F (36.6 C) 97.6 F (36.4 C)  TempSrc: Oral Oral Oral Oral  SpO2: 100% 100% 100% 95%  Weight:      Height:        Intake/Output Summary (Last 24 hours) at 06/02/2021 0805 Last data filed at  06/02/2021 0500 Gross per 24 hour  Intake 602.08 ml  Output 300 ml  Net 302.08 ml    Filed Weights   05/31/21 1707 05/31/21 2246  Weight: (!) 201.9 kg (!) 152.3 kg    Examination:  General exam: Alert, NAD Respiratory system: CTA Cardiovascular system: S 1, S 2 RRR Gastrointestinal system:  BS present, Soft, nt Central nervous system: Alert, follows command.  Extremities: no edema     Data Reviewed: I have personally reviewed following labs and imaging studies  CBC: Recent Labs  Lab 05/31/21 1706 05/31/21 1725 05/31/21 1727 06/01/21 0546 06/02/21 0108  WBC 12.1*  --   --  10.0 7.4  NEUTROABS 8.5*  --   --   --   --   HGB 14.3 16.0* 16.0* 12.4 12.2  HCT 45.3 47.0* 47.0* 39.3 39.2  MCV 87.1  --   --  85.1 86.5  PLT 271  --   --  216 371    Basic Metabolic Panel: Recent Labs  Lab 06/01/21 0546 06/01/21 0806 06/01/21 1432 06/01/21 2003 06/01/21 2143 06/02/21 0108 06/02/21 0552  NA 142   < > 144 145 145 143 142  K 4.2   < > 4.0 4.0 4.5 4.2 4.2  CL 106   < > 107 107 108 106 108  CO2 21*   < > 23 22 21* 21* 21*  GLUCOSE 234*   < > 221* 261* 322* 363* 365*  BUN 29*   < > _1 CREATININE 1.50*   < > 1.32* 1.36* 1.45* 1.48* 1.55*  CALCIUM 8.9   < > 9.0 9.0 9.0 8.9 8.7*  MG 2.5*  --   --   --   --   --   --   PHOS 2.6  --   --   --   --   --   --    < > = values in this interval not displayed.    GFR: Estimated Creatinine Clearance: 65.9 mL/min (A) (by C-G formula based on SCr of 1.55 mg/dL (H)). Liver Function Tests: Recent Labs  Lab 05/31/21 1706 06/01/21 0546  AST 24 23  ALT 33 28  ALKPHOS 97 83  BILITOT 3.0* 2.0*  PROT 9.0* 7.8  ALBUMIN 3.9 3.3*    Recent Labs  Lab 05/31/21 1706 06/01/21 0947  LIPASE 100* 70*    Recent Labs  Lab 06/01/21 0947  AMMONIA 39*    Coagulation Profile: No results for input(s): INR, PROTIME in the last 168 hours. Cardiac Enzymes: No results for input(s): CKTOTAL, CKMB, CKMBINDEX, TROPONINI in  the last 168 hours. BNP (last 3 results) No results for input(s): PROBNP in the last 8760 hours. HbA1C: Recent Labs    06/01/21 0546  HGBA1C 14.8*   CBG: Recent Labs  Lab 06/01/21 1730 06/01/21 2132 06/02/21 0315 06/02/21 0624 06/02/21 0737  GLUCAP 224* 312* 394* 379* 347*    Lipid Profile: No results for input(s): CHOL, HDL, LDLCALC, TRIG, CHOLHDL, LDLDIRECT in the last  72 hours. Thyroid Function Tests: Recent Labs    05/31/21 1854  TSH 60.161*    Anemia Panel: No results for input(s): VITAMINB12, FOLATE, FERRITIN, TIBC, IRON, RETICCTPCT in the last 72 hours. Sepsis Labs: Recent Labs  Lab 05/31/21 1713 06/01/21 0041  LATICACIDVEN 2.3* 2.4*     Recent Results (from the past 240 hour(s))  Resp Panel by RT-PCR (Flu A&B, Covid) Nasopharyngeal Swab     Status: None   Collection Time: 05/31/21  5:34 PM   Specimen: Nasopharyngeal Swab; Nasopharyngeal(NP) swabs in vial transport medium  Result Value Ref Range Status   SARS Coronavirus 2 by RT PCR NEGATIVE NEGATIVE Final    Comment: (NOTE) SARS-CoV-2 target nucleic acids are NOT DETECTED.  The SARS-CoV-2 RNA is generally detectable in upper respiratory specimens during the acute phase of infection. The lowest concentration of SARS-CoV-2 viral copies this assay can detect is 138 copies/mL. A negative result does not preclude SARS-Cov-2 infection and should not be used as the sole basis for treatment or other patient management decisions. A negative result may occur with  improper specimen collection/handling, submission of specimen other than nasopharyngeal swab, presence of viral mutation(s) within the areas targeted by this assay, and inadequate number of viral copies(<138 copies/mL). A negative result must be combined with clinical observations, patient history, and epidemiological information. The expected result is Negative.  Fact Sheet for Patients:  EntrepreneurPulse.com.au  Fact Sheet  for Healthcare Providers:  IncredibleEmployment.be  This test is no t yet approved or cleared by the [place] FDA and  has been authorized for detection and/or diagnosis of SARS-CoV-2 by FDA under an Emergency Use Authorization (EUA). This EUA will remain  in effect (meaning this test can be used) for the duration of the COVID-19 declaration under Section 564(b)(1) of the Act, 21 U.S.C.section 360bbb-3(b)(1), unless the authorization is terminated  or revoked sooner.       Influenza A by PCR NEGATIVE NEGATIVE Final   Influenza B by PCR NEGATIVE NEGATIVE Final    Comment: (NOTE) The Xpert Xpress SARS-CoV-2/FLU/RSV plus assay is intended as an aid in the diagnosis of influenza from Nasopharyngeal swab specimens and should not be used as a sole basis for treatment. Nasal washings and aspirates are unacceptable for Xpert Xpress SARS-CoV-2/FLU/RSV testing.  Fact Sheet for Patients: EntrepreneurPulse.com.au  Fact Sheet for Healthcare Providers: IncredibleEmployment.be  This test is not yet approved or cleared by the [place] FDA and has been authorized for detection and/or diagnosis of SARS-CoV-2 by FDA under an Emergency Use Authorization (EUA). This EUA will remain in effect (meaning this test can be used) for the duration of the COVID-19 declaration under Section 564(b)(1) of the Act, 21 U.S.C. section 360bbb-3(b)(1), unless the authorization is terminated or revoked.  Performed at [facility], [place] [address], [place], [place] [address]           Radiology Studies: CT ABDOMEN PELVIS WO CONTRAST  Result Date: 05/31/2021 CLINICAL DATA:  Nausea and vomiting.  Epigastric pain. EXAM: CT ABDOMEN AND PELVIS WITHOUT CONTRAST TECHNIQUE: Multidetector CT imaging of the abdomen and pelvis was performed following the standard protocol without IV contrast. COMPARISON:  None. FINDINGS: Lower chest: No focal airspace  disease or pleural effusion, mild motion artifact limitations. Heart is normal in size. Hepatobiliary: Diffusely decreased hepatic density consistent with steatosis. Geographic area of decreased density in the central liver abutting the falciform ligament is favored to be geographic steatosis but technically indeterminate. Gallbladder is difficult to delineate due to  similar density to the adjacent liver, possible intraluminal sludge. No calcified gallstone. No biliary dilatation. Pancreas: Suggestion of mild edema adjacent to the pancreatic head, for example series 4, image 44. No pancreatic ductal dilatation. No peripancreatic collection. Spleen: Normal in size without focal abnormality. Adrenals/Urinary Tract: Low-density left adrenal nodule consistent with adenoma. Normal right adrenal gland. No hydronephrosis. No visualized renal calculi. Motion artifact through the lower right kidney. No evidence of focal renal lesion. Unremarkable urinary bladder. Stomach/Bowel: Small hiatal hernia. The stomach is decompressed. No small bowel obstruction. No small bowel inflammation. Appendix is not definitively identified, no appendicitis. Small volume of colonic stool. Occasional left colonic diverticula without diverticulitis. No colonic wall thickening or inflammatory change. Vascular/Lymphatic: Normal caliber abdominal aorta. No portal venous or mesenteric gas. No abdominopelvic adenopathy. Reproductive: Uterus and bilateral adnexa are unremarkable. Other: No free air, free fluid, or intra-abdominal fluid collection. Small fat containing umbilical hernia. Musculoskeletal: Degenerative change in the spine and both sacroiliac joints. There are no acute or suspicious osseous abnormalities. IMPRESSION: 1. Suggestion of mild edema adjacent to the pancreatic head, suspicious for acute pancreatitis. Recommend correlation with pancreatic enzymes. 2. Hepatic steatosis. Geographic area of decreased density in the central liver  abutting the falciform ligament is favored to be geographic steatosis but technically indeterminate. Recommend nonemergent hepatic protocol MRI for characterization on an elective basis. 3. Small hiatal hernia. Small fat containing umbilical hernia. 4. Left adrenal adenoma. Electronically Signed   By: Keith Rake M.D.   On: 05/31/2021 22:42   CT Head Wo Contrast  Result Date: 05/31/2021 CLINICAL DATA:  Altered mental status EXAM: CT HEAD WITHOUT CONTRAST TECHNIQUE: Contiguous axial images were obtained from the base of the skull through the vertex without intravenous contrast. COMPARISON:  08/04/2014 FINDINGS: Brain: The cerebellar tonsils are low lying, similar to prior examination. No abnormal intra or extra-axial mass lesion or fluid collection. No abnormal mass effect or midline shift. No evidence of acute intracranial hemorrhage or infarct. Ventricular size is normal. Cerebellum unremarkable. Vascular: Unremarkable Skull: Intact Sinuses/Orbits: Paranasal sinuses are clear. Orbits are unremarkable. Other: Mastoid air cells and middle ear cavities are clear. IMPRESSION: No acute intracranial abnormality. Low lying cerebellar tonsils, unchanged from prior examination. Electronically Signed   By: Fidela Salisbury MD   On: 05/31/2021 22:40   DG CHEST PORT 1 VIEW  Result Date: 06/01/2021 CLINICAL DATA:  Altered mental status since 07/27. EXAM: PORTABLE CHEST 1 VIEW COMPARISON:  08/04/2014 FINDINGS: Midline trachea. Mild cardiomegaly. Mediastinal contours otherwise within normal limits. No pleural effusion or pneumothorax. Clear lungs. No congestive failure. IMPRESSION: Cardiomegaly without congestive failure. Electronically Signed   By: Abigail Miyamoto M.D.   On: 06/01/2021 11:54        Scheduled Meds:  budesonide  0.5 mg Inhalation BID   enoxaparin (LOVENOX) injection  0.5 mg/kg Subcutaneous Q24H   insulin aspart  0-15 Units Subcutaneous TID WC   insulin aspart  0-5 Units Subcutaneous QHS    insulin glargine-yfgn  30 Units Subcutaneous BID   insulin starter kit- pen needles  1 kit Other Once   levothyroxine  100 mcg Intravenous Daily   living well with diabetes book   Does not apply Once   montelukast  10 mg Oral Daily   Continuous Infusions:  sodium chloride 100 mL/hr at 06/01/21 2244   cefTRIAXone (ROCEPHIN)  IV 1 g (06/01/21 1000)     LOS: 2 days    Time spent: 35 minutes.     Elmarie Shiley, MD  [facility]   If 7PM-7AM, please contact night-coverage [email or web address]  06/02/2021, 8:05 AM

## 2021-06-02 NOTE — Progress Notes (Signed)
Received call from Dr. Sunnie Nielsen, stated pt's GAP is opening up again, call results of stat BMEt to night on call MD and pt may need to be back on insulin drip. Will continue to monitor.

## 2021-06-02 NOTE — Progress Notes (Signed)
Inpatient Diabetes Program Recommendations  AACE/ADA: New Consensus Statement on Inpatient Glycemic Control (2015)  Target Ranges:  Prepandial:   less than 140 mg/dL      Peak postprandial:   less than 180 mg/dL (1-2 hours)      Critically ill patients:  140 - 180 mg/dL   Lab Results  Component Value Date   GLUCAP 347 (H) 06/02/2021   HGBA1C 14.8 (H) 06/01/2021    Review of Glycemic Control Results for Vanessa [name], Vanessa [name] (MRN [id number]) as of 06/02/2021 08:44  Ref. Range 06/02/2021 03:15 06/02/2021 06:24 06/02/2021 07:37  Glucose-Capillary Latest Ref Range: 70 - 99 mg/dL 401 (H) 027 (H) 253 (H)   Diabetes history: DM 2 Outpatient Diabetes medications:  None-Prednisone taper ordered in May, 2022 and then reordered on 04/17/21 Current orders for Inpatient glycemic control:  Novolog moderate tid with meals and HS Semglee 30 units bid  Inpatient Diabetes Program Recommendations:    Note A1C=14.8% indicating need for insulin at home.  Agree with orders.  Diabetes coordinator will see patient on 8/1 to reinforce teaching on insulin.   Thanks,  Beryl Meager, RN, BC-ADM Inpatient Diabetes Coordinator Pager [phone]  (8a-5p)

## 2021-06-02 NOTE — Progress Notes (Signed)
On call DR. Toniann Fail called and updated with BMET results, Anion gap of 16 and Glucose 363. Per MD it has not worsened, continue with sliding scale , can give sliding scale now and recheck BMET at 0500. And will go from there. Will continue to monitor.

## 2021-06-03 ENCOUNTER — Encounter (HOSPITAL_COMMUNITY): Payer: Self-pay | Admitting: Internal Medicine

## 2021-06-03 LAB — BASIC METABOLIC PANEL
Anion gap: 10 (ref 5–15)
BUN: 15 mg/dL (ref 6–20)
CO2: 23 mmol/L (ref 22–32)
Calcium: 8.5 mg/dL — ABNORMAL LOW (ref 8.9–10.3)
Chloride: 103 mmol/L (ref 98–111)
Creatinine, Ser: 1.24 mg/dL — ABNORMAL HIGH (ref 0.44–1.00)
GFR, Estimated: 53 mL/min — ABNORMAL LOW (ref >60)
Glucose, Bld: 368 mg/dL — ABNORMAL HIGH (ref 70–99)
Potassium: 3.8 mmol/L (ref 3.5–5.1)
Sodium: 136 mmol/L (ref 135–145)

## 2021-06-03 LAB — GLUCOSE, CAPILLARY
Glucose-Capillary: 303 mg/dL — ABNORMAL HIGH (ref 70–99)
Glucose-Capillary: 342 mg/dL — ABNORMAL HIGH (ref 70–99)
Glucose-Capillary: 318 mg/dL — ABNORMAL HIGH (ref 70–99)
Glucose-Capillary: 344 mg/dL — ABNORMAL HIGH (ref 70–99)

## 2021-06-03 MED ORDER — INSULIN GLARGINE-YFGN 100 UNIT/ML ~~LOC~~ SOLN
40.0000 [IU] | Freq: Two times a day (BID) | SUBCUTANEOUS | Status: DC
Start: 2021-06-03 — End: 2021-06-04
  Administered 2021-06-03 (×2): 40 [IU] via SUBCUTANEOUS
  Filled 2021-06-03 (×4): qty 0.4

## 2021-06-03 MED ORDER — GLIPIZIDE 5 MG PO TABS
5.0000 mg | ORAL_TABLET | Freq: Every day | ORAL | Status: DC
  Administered 2021-06-04 – 2021-06-06 (×3): 5 mg via ORAL
  Filled 2021-06-03 (×3): qty 1

## 2021-06-03 MED ORDER — INSULIN ASPART 100 UNIT/ML IJ SOLN
8.0000 [IU] | Freq: Three times a day (TID) | INTRAMUSCULAR | Status: DC
  Administered 2021-06-03 – 2021-06-07 (×11): 8 [IU] via SUBCUTANEOUS

## 2021-06-03 MED ORDER — LACTULOSE 10 GM/15ML PO SOLN
20.0000 g | Freq: Once | ORAL | Status: AC
  Administered 2021-06-03: 20 g via ORAL
  Filled 2021-06-03: qty 30

## 2021-06-03 MED ORDER — SODIUM CHLORIDE 0.9 % IV SOLN
2.0000 g | INTRAVENOUS | Status: DC
  Administered 2021-06-04: 2 g via INTRAVENOUS
  Filled 2021-06-03: qty 20

## 2021-06-03 MED ORDER — INSULIN ASPART 100 UNIT/ML IJ SOLN: 10.0000 [IU] | Freq: Three times a day (TID) | INTRAMUSCULAR | Status: DC

## 2021-06-03 MED ORDER — POLYETHYLENE GLYCOL 3350 17 G PO PACK
17.0000 g | PACK | Freq: Every day | ORAL | Status: DC
  Filled 2021-06-03: qty 1
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
## 2021-06-03 NOTE — Plan of Care (Signed)
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
## 2021-06-03 NOTE — Progress Notes (Signed)
PROGRESS NOTE    Vanessa [name]  OVZ:[id number] DOB: Jan 23, 1971 DOA: 05/31/2021 PCP: Biagio Borg, MD   Brief Narrative: 50 year old with past medical history significant for lumbar spinal stenosis, morbid obesity, hypothyroidism, OSA, noncompliant with CPAP, prediabetic who presented to the ED due to altered mental status.  Last known well was on Wednesday afternoon.  Patient has been vomiting intermittently for the last 2 weeks, associated with abdominal pain.  Evaluation in the ED patient was found to be in DKA, CBG 621, bicarb 9, anion gap of 33, beta hydroxybutyrate greater than 8 pH 7.2.    Assessment & Plan:   Active Problems:   DKA, type 2 ([place])   1-DKA type II: Anion gap metabolic acidosis Patient had a history of prediabetes A1c of 6.0 on 05/16/2019 Presented with metabolic acidosis, bicarb of 9 anion gap of 33, beta hydroxybutyrate greater than 8, CBG 621. She was treated  with  insulin drip, IV fluids. Gap  closed her bicarb  more than 21 CBG less than 200. Continue Gargline BID. SSI. Moderate. Increase insulin to 40 units BID> increase meals coverage to 8 units.  A1C at 14.  Will add glipizide daily.   2-Acute metabolic encephalopathy: Secondary to DKA, acidosis, uncontrolled hypothyroidism. TSH at 60 Started on levothyroxine Synthroid, continue with 100 mcg daily Repeated Blood gas improved.  Continue to feels sluggish.  Improving.   3-Uncontrolled Hypothyroidism: TSH greater than 60. Prior to admission levothyroxine 200 mcg daily. Continue with Synthroid to 100 mcg daily IV>  One more day of IV synthroid, to help with MS  4-UTI: UA with pyuria, reported dysuria. Continue with ceftriaxone, day 3/3.  Follow urine culture. Insignificant growth.   Blurry Vision;  She report Blurry vision for 2 months.  CT head showed: Low lying cerebellar tonsils, unchanged from prior examination. Discussed with neurology, patient will need Ophthalmology evaluation out  patient. If no eye pathology found she will need follow up with Neurology for MRI brain and further evaluation of Chiari  malformation.   AKI: Likely related to dehydration, DKA: Improving with IV fluids. Creatinine peak to 2.3 Cr down to 1.2 today.   Hyperkalemia: In the setting of AKI, and BKA.  Improved  Pancreatitis: Lipase elevated CT abdomen and pelvis showed inflammation in the pancreas No Calcified gallstone. Fluids, avoid narcotics due to altered mental status. Denies abdominal pain. Plan to start diet.   HTN;  Agree with holding hydrochlorothiazide and losartan in the setting of AKI Depression anxiety:  continue to hold medication  Hepatic steatosis by CT scan: She will need MRI once stable as an outpatient  Estimated body mass index is 55.87 kg/m as calculated from the following:   Height as of this encounter: $RemoveBeforeD'5\' 5"'mcFSopgsfmMsnZ$  (1.651 m).   Weight as of this encounter: 152.3 kg.   DVT prophylaxis: Lovenox Code Status: Full Code Family Communication: daughter who was at bedside 7/31 Disposition Plan:  Status is: Inpatient  Remains inpatient appropriate because:IV treatments appropriate due to intensity of illness or inability to take PO  Dispo:  Patient From: Home  Planned Disposition: Home  Medically stable for discharge: No          Consultants:  CCM  Procedures:  None  Antimicrobials:  Ceftriaxone.   Subjective: She is feeling better, still feels sluggish.    Objective: Vitals:   06/03/21 0724 06/03/21 0751 06/03/21 1109 06/03/21 1626  BP: 125/73  137/82 133/86  Pulse: 70  65 73  Resp: 19  18 17  Temp: 98.2 F (36.8 C)  97.7 F (36.5 C) 98.1 F (36.7 C)  TempSrc: Oral  Oral Oral  SpO2: 96% 100% 99% 98%  Weight:      Height:        Intake/Output Summary (Last 24 hours) at 06/03/2021 1712 Last data filed at 06/03/2021 0100 Gross per 24 hour  Intake 920 ml  Output --  Net 920 ml    Filed Weights   05/31/21 1707 05/31/21 2246  Weight: (!)  201.9 kg (!) 152.3 kg    Examination:  General exam: NAD Respiratory system: CTA Cardiovascular system: S 1 S 2 RRR Gastrointestinal system:  BS present, soft  Central nervous system: alert, follows command Extremities: no edema     Data Reviewed: I have personally reviewed following labs and imaging studies  CBC: Recent Labs  Lab 05/31/21 1706 05/31/21 1725 05/31/21 1727 06/01/21 0546 06/02/21 0108  WBC 12.1*  --   --  10.0 7.4  NEUTROABS 8.5*  --   --   --   --   HGB 14.3 16.0* 16.0* 12.4 12.2  HCT 45.3 47.0* 47.0* 39.3 39.2  MCV 87.1  --   --  85.1 86.5  PLT 271  --   --  216 703    Basic Metabolic Panel: Recent Labs  Lab 06/01/21 0546 06/01/21 0806 06/01/21 2003 06/01/21 2143 06/02/21 0108 06/02/21 0552 06/03/21 0139  NA 142   < > 145 145 143 142 136  K 4.2   < > 4.0 4.5 4.2 4.2 3.8  CL 106   < > 107 108 106 108 103  CO2 21*   < > 22 21* 21* 21* 23  GLUCOSE 234*   < > 261* 322* 363* 365* 368*  BUN 29*   < > $R'19 18 19 20 15  'bT$ CREATININE 1.50*   < > 1.36* 1.45* 1.48* 1.55* 1.24*  CALCIUM 8.9   < > 9.0 9.0 8.9 8.7* 8.5*  MG 2.5*  --   --   --   --   --   --   PHOS 2.6  --   --   --   --   --   --    < > = values in this interval not displayed.    GFR: Estimated Creatinine Clearance: 82.4 mL/min (A) (by C-G formula based on SCr of 1.24 mg/dL (H)). Liver Function Tests: Recent Labs  Lab 05/31/21 1706 06/01/21 0546  AST 24 23  ALT 33 28  ALKPHOS 97 83  BILITOT 3.0* 2.0*  PROT 9.0* 7.8  ALBUMIN 3.9 3.3*    Recent Labs  Lab 05/31/21 1706 06/01/21 0947  LIPASE 100* 70*    Recent Labs  Lab 06/01/21 0947  AMMONIA 39*    Coagulation Profile: No results for input(s): INR, PROTIME in the last 168 hours. Cardiac Enzymes: No results for input(s): CKTOTAL, CKMB, CKMBINDEX, TROPONINI in the last 168 hours. BNP (last 3 results) No results for input(s): PROBNP in the last 8760 hours. HbA1C: Recent Labs    06/01/21 0546  HGBA1C 14.8*     CBG: Recent Labs  Lab 06/02/21 1950 06/02/21 2125 06/03/21 0617 06/03/21 1110 06/03/21 1630  GLUCAP 347* 352* 303* 342* 318*    Lipid Profile: No results for input(s): CHOL, HDL, LDLCALC, TRIG, CHOLHDL, LDLDIRECT in the last 72 hours. Thyroid Function Tests: Recent Labs    05/31/21 1854  TSH 60.161*    Anemia Panel: No results for input(s): VITAMINB12, FOLATE, FERRITIN, TIBC,  IRON, RETICCTPCT in the last 72 hours. Sepsis Labs: Recent Labs  Lab 05/31/21 1713 06/01/21 0041  LATICACIDVEN 2.3* 2.4*     Recent Results (from the past 240 hour(s))  Resp Panel by RT-PCR (Flu A&B, Covid) Nasopharyngeal Swab     Status: None   Collection Time: 05/31/21  5:34 PM   Specimen: Nasopharyngeal Swab; Nasopharyngeal(NP) swabs in vial transport medium  Result Value Ref Range Status   SARS Coronavirus 2 by RT PCR NEGATIVE NEGATIVE Final    Comment: (NOTE) SARS-CoV-2 target nucleic acids are NOT DETECTED.  The SARS-CoV-2 RNA is generally detectable in upper respiratory specimens during the acute phase of infection. The lowest concentration of SARS-CoV-2 viral copies this assay can detect is 138 copies/mL. A negative result does not preclude SARS-Cov-2 infection and should not be used as the sole basis for treatment or other patient management decisions. A negative result may occur with  improper specimen collection/handling, submission of specimen other than nasopharyngeal swab, presence of viral mutation(s) within the areas targeted by this assay, and inadequate number of viral copies(<138 copies/mL). A negative result must be combined with clinical observations, patient history, and epidemiological information. The expected result is Negative.  Fact Sheet for Patients:  EntrepreneurPulse.com.au  Fact Sheet for Healthcare Providers:  IncredibleEmployment.be  This test is no t yet approved or cleared by the [place] FDA and  has  been authorized for detection and/or diagnosis of SARS-CoV-2 by FDA under an Emergency Use Authorization (EUA). This EUA will remain  in effect (meaning this test can be used) for the duration of the COVID-19 declaration under Section 564(b)(1) of the Act, 21 U.S.C.section 360bbb-3(b)(1), unless the authorization is terminated  or revoked sooner.       Influenza A by PCR NEGATIVE NEGATIVE Final   Influenza B by PCR NEGATIVE NEGATIVE Final    Comment: (NOTE) The Xpert Xpress SARS-CoV-2/FLU/RSV plus assay is intended as an aid in the diagnosis of influenza from Nasopharyngeal swab specimens and should not be used as a sole basis for treatment. Nasal washings and aspirates are unacceptable for Xpert Xpress SARS-CoV-2/FLU/RSV testing.  Fact Sheet for Patients: EntrepreneurPulse.com.au  Fact Sheet for Healthcare Providers: IncredibleEmployment.be  This test is not yet approved or cleared by the [place] FDA and has been authorized for detection and/or diagnosis of SARS-CoV-2 by FDA under an Emergency Use Authorization (EUA). This EUA will remain in effect (meaning this test can be used) for the duration of the COVID-19 declaration under Section 564(b)(1) of the Act, 21 U.S.C. section 360bbb-3(b)(1), unless the authorization is terminated or revoked.  Performed at [facility], [place] [address] [place], [place] [address]   Urine Culture     Status: Abnormal   Collection Time: 06/01/21  1:51 PM   Specimen: Urine, Clean Catch  Result Value Ref Range Status   Specimen Description URINE, CLEAN CATCH  Final   Special Requests NONE  Final   Culture (A)  Final    <10,000 COLONIES/mL INSIGNIFICANT GROWTH Performed at [facility], [place] [address], [place], [place] [address]    Report Status 06/02/2021 FINAL  Final          Radiology Studies: No results found.      Scheduled Meds:  budesonide (PULMICORT) nebulizer  solution  0.25 mg Nebulization BID   enoxaparin (LOVENOX) injection  0.5 mg/kg Subcutaneous Q24H   insulin aspart  0-15 Units Subcutaneous TID WC   insulin aspart  0-5 Units Subcutaneous QHS  insulin aspart  4 Units Subcutaneous TID WC   insulin glargine-yfgn  40 Units Subcutaneous BID   insulin starter kit- pen needles  1 kit Other Once   levothyroxine  100 mcg Intravenous Daily   living well with diabetes book   Does not apply Once   montelukast  10 mg Oral Daily   Continuous Infusions:  sodium chloride 100 mL/hr at 06/03/21 0959   [START ON 06/04/2021] cefTRIAXone (ROCEPHIN)  IV       LOS: 3 days    Time spent: 35 minutes.     Elmarie Shiley, MD [facility]   If 7PM-7AM, please contact night-coverage [email or web address]  06/03/2021, 5:12 PM

## 2021-06-03 NOTE — Discharge Instructions (Signed)
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
## 2021-06-03 NOTE — Plan of Care (Signed)
  RD consulted for nutrition education regarding diabetes.   Lab Results  Component Value Date   HGBA1C 14.8 (H) 06/01/2021   Patient endorses eating one meal daily that consists of chicken (air fried, oil fried, baked), vegetables, and a grain. Drinks mostly water and sprite. Discussed A1C, blood sugars, and how to manage diabetes through medication/meal composition. Discussed the importance of eating 3 meals daily, how make meals balanced, which foods contain carbohydrates, how to read a food label, and how to make appropriate beverage substitution. RD to place outpatient referral for further education.   RD provided "Carbohydrate Counting for People with Diabetes" handout from the Academy of [employer] and Dietetics. Discussed different food groups and their effects on blood sugar, emphasizing carbohydrate-containing foods. Provided list of carbohydrates and recommended serving sizes of common foods.  Discussed importance of controlled and consistent carbohydrate intake throughout the day. Provided examples of ways to balance meals/snacks and encouraged intake of high-fiber, whole grain complex carbohydrates. Teach back method used.  Expect good compliance.  Labs and medications reviewed. No further nutrition interventions warranted at this time. RD contact information provided. If additional nutrition issues arise, please re-consult RD.  Vanessa Kick MS, RD, LDN, CNSC Clinical Nutrition Pager listed in AMION

## 2021-06-04 LAB — BASIC METABOLIC PANEL
Anion gap: 9 (ref 5–15)
BUN: 10 mg/dL (ref 6–20)
CO2: 25 mmol/L (ref 22–32)
Calcium: 8.8 mg/dL — ABNORMAL LOW (ref 8.9–10.3)
Chloride: 103 mmol/L (ref 98–111)
Creatinine, Ser: 1.2 mg/dL — ABNORMAL HIGH (ref 0.44–1.00)
GFR, Estimated: 55 mL/min — ABNORMAL LOW (ref >60)
Glucose, Bld: 343 mg/dL — ABNORMAL HIGH (ref 70–99)
Potassium: 3.7 mmol/L (ref 3.5–5.1)
Sodium: 137 mmol/L (ref 135–145)

## 2021-06-04 LAB — CBC
HCT: 32.8 % — ABNORMAL LOW (ref 36.0–46.0)
Hemoglobin: 10.6 g/dL — ABNORMAL LOW (ref 12.0–15.0)
MCH: 28.1 pg (ref 26.0–34.0)
MCHC: 32.3 g/dL (ref 30.0–36.0)
MCV: 87 fL (ref 80.0–100.0)
Platelets: 140 10*3/uL — ABNORMAL LOW (ref 150–400)
RBC: 3.77 MIL/uL — ABNORMAL LOW (ref 3.87–5.11)
RDW: 21.8 % — ABNORMAL HIGH (ref 11.5–15.5)
WBC: 5.9 10*3/uL (ref 4.0–10.5)
nRBC: 0 % (ref 0.0–0.2)

## 2021-06-04 LAB — GLUCOSE, CAPILLARY
Glucose-Capillary: 344 mg/dL — ABNORMAL HIGH (ref 70–99)
Glucose-Capillary: 308 mg/dL — ABNORMAL HIGH (ref 70–99)
Glucose-Capillary: 280 mg/dL — ABNORMAL HIGH (ref 70–99)
Glucose-Capillary: 338 mg/dL — ABNORMAL HIGH (ref 70–99)

## 2021-06-04 MED ORDER — INSULIN GLARGINE-YFGN 100 UNIT/ML ~~LOC~~ SOLN
55.0000 [IU] | Freq: Two times a day (BID) | SUBCUTANEOUS | Status: DC
  Filled 2021-06-04 (×2): qty 0.55

## 2021-06-04 MED ORDER — CITALOPRAM HYDROBROMIDE 20 MG PO TABS
20.0000 mg | ORAL_TABLET | Freq: Every day | ORAL | Status: DC
  Administered 2021-06-05 – 2021-06-07 (×3): 20 mg via ORAL
  Filled 2021-06-04 (×3): qty 1

## 2021-06-04 MED ORDER — INSULIN GLARGINE-YFGN 100 UNIT/ML ~~LOC~~ SOLN
50.0000 [IU] | Freq: Two times a day (BID) | SUBCUTANEOUS | Status: DC
  Administered 2021-06-04 – 2021-06-05 (×3): 50 [IU] via SUBCUTANEOUS
  Filled 2021-06-04 (×4): qty 0.5

## 2021-06-04 MED ORDER — LEVOTHYROXINE SODIUM 100 MCG PO TABS
200.0000 ug | ORAL_TABLET | Freq: Every day | ORAL | Status: DC
  Administered 2021-06-06 – 2021-06-07 (×2): 200 ug via ORAL
  Filled 2021-06-04 (×2): qty 2

## 2021-06-04 NOTE — Plan of Care (Signed)
  Problem: Education: Goal: Knowledge of General Education information will improve Description: Including pain rating scale, medication(s)/side effects and non-pharmacologic comfort measures Outcome: Progressing   Problem: Health Behavior/Discharge Planning: Goal: Ability to manage health-related needs will improve Outcome: Progressing   Problem: Clinical Measurements: Goal: Respiratory complications will improve Outcome: Progressing Goal: Cardiovascular complication will be avoided Outcome: Progressing   Problem: Activity: Goal: Risk for activity intolerance will decrease Outcome: Progressing   Problem: Coping: Goal: Level of anxiety will decrease Outcome: Progressing   Problem: Elimination: Goal: Will not experience complications related to bowel motility Outcome: Progressing   

## 2021-06-04 NOTE — Progress Notes (Signed)
Inpatient Diabetes Program Recommendations  AACE/ADA: New Consensus Statement on Inpatient Glycemic Control (2015)  Target Ranges:  Prepandial:   less than 140 mg/dL      Peak postprandial:   less than 180 mg/dL (1-2 hours)      Critically ill patients:  140 - 180 mg/dL   Lab Results  Component Value Date   GLUCAP 308 (H) 06/04/2021   HGBA1C 14.8 (H) 06/01/2021    Review of Glycemic Control Results for Vanessa [name], Vanessa [name] (MRN [id number]) as of 06/04/2021 15:55  Ref. Range 06/03/2021 11:10 06/03/2021 16:30 06/03/2021 21:12 06/04/2021 06:24 06/04/2021 11:03  Glucose-Capillary Latest Ref Range: 70 - 99 mg/dL 250 (H) 037 (H) 048 (H) 344 (H) 308 (H)  Diabetes history: DM 2 Outpatient Diabetes medications:  None-Prednisone taper ordered in May, 2022 and then reordered on 04/17/21 Current orders for Inpatient glycemic control:  Semglee 50 units bid, Novolog 8 units tid with meals, Novolog moderate tid with meals and HS, Glucotrol 5 mg with breakfast Inpatient Diabetes Program Recommendations:         Note blood sugars remain difficult to control.  Patient has been practicing with RN on giving insulin injections.  She states she is doing well and feeling more comfortable. Showed her insulin pen on 06/03/21. She will also need a glucose meter at d/c.         We reviewed normal blood sugar values and signs/symptoms of hypoglycemia.  Teach back used.  I reinforced teaching on hypoglycemia, proper treatment, and the need to call MD if blood sugar low to determine if insulin doses need to be changed.  Unsure what basal/bolus insulins her insurance will cover?  May do better with pre-mixed insulin such as 70/30 b/c it can be given 2 times a day instead of 4 times a day?  If patient to be switched to 70/30, consider70/30 55 units bid.  If she is on 70/30 will need to ensure that she eats 3 meals a day (currently states she only eats once a day).    Thanks,  Beryl Meager, RN, BC-ADM Inpatient Diabetes  Coordinator Pager [phone]  (8a-5p)

## 2021-06-04 NOTE — Progress Notes (Signed)
PROGRESS NOTE    Vanessa [name]  RSW:[id number] DOB: [birth year] DOA: 05/31/2021 PCP: Biagio Borg, MD   Brief Narrative: 50 year old with past medical history significant for lumbar spinal stenosis, morbid obesity, hypothyroidism, OSA, noncompliant with CPAP, prediabetic who presented to the ED due to altered mental status.  Last known well was on Wednesday afternoon.  Patient has been vomiting intermittently for the last 2 weeks, associated with abdominal pain.  Evaluation in the ED patient was found to be in DKA, CBG 621, bicarb 9, anion gap of 33, beta hydroxybutyrate greater than 8 pH 7.2.  Patient has been transition to long acting insulin. CBG has been persistently in the 300., We are adjusting insulin regime. Home when CBG 200 range.   Assessment & Plan:   Active Problems:   DKA, type 2 ([place])   1-DKA type II: Anion gap metabolic acidosis Patient had a history of prediabetes A1c of 6.0 on 05/16/2019 Presented with metabolic acidosis, bicarb of 9 anion gap of 33, beta hydroxybutyrate greater than 8, CBG 621. She was treated  with  insulin drip, IV fluids. Gap  closed,  bicarb  more than 21 CBG less than 200. Continue Gargline BID. SSI. Moderate. Increase insulin to 50 units BID> on  meals coverage to 8 units.  A1C at 14.  Started glipizide daily 8/02. She is insulin resistance. Continue to adjust insulin regimen. Home when CBG in the 200 range. Marland Kitchen   2-Acute metabolic encephalopathy: Secondary to DKA, acidosis, uncontrolled hypothyroidism. TSH at 60 Started on levothyroxine Synthroid, Received 100 mcg IV synthroid.  Repeated Blood gas improved.  She is improving. Feels better.  Plan to resume oral synthroid 8/03.  3-Uncontrolled Hypothyroidism: TSH greater than 60. Prior to admission levothyroxine 200 mcg daily. Treated with Synthroid to 100 mcg daily IV>  Plan to resume oral synthroid 8/03.  4-UTI: UA with pyuria, reported dysuria. Treated  with ceftriaxone, day 3/3.   Urine culture. Insignificant growth.  Discontinue antibiotics 8/03.  Blurry Vision;  She report Blurry vision for 2 months.  CT head showed: Low lying cerebellar tonsils, unchanged from prior examination. Discussed with neurology, patient will need Ophthalmology evaluation out patient. If no eye pathology found she will need follow up with Neurology for MRI brain and further evaluation of Chiari  malformation.   AKI: Likely related to dehydration, DKA: Improving with IV fluids. Creatinine peak to 2.3 Cr down to 1.2 . Stable.   Hyperkalemia: In the setting of AKI, and BKA.  Resolved.   Pancreatitis: Lipase elevated CT abdomen and pelvis showed inflammation in the pancreas No Calcified gallstone. Fluids, avoid narcotics due to altered mental status. Tolerating diet.   HTN; holding hydrochlorothiazide and losartan in the setting of AKI.  BP not elevated.  Depression anxiety: Resume Celexa tomorrow. Resume gabapentin 8/4 if tolerates celexa.    Hepatic steatosis by CT scan: She will need MRI once stable as an outpatient  Estimated body mass index is 55.87 kg/m as calculated from the following:   Height as of this encounter: 5' 5" (1.651 m).   Weight as of this encounter: 152.3 kg.   DVT prophylaxis: Lovenox Code Status: Full Code Family Communication: daughter who was at bedside 8/02 Disposition Plan:  Status is: Inpatient  Remains inpatient appropriate because:IV treatments appropriate due to intensity of illness or inability to take PO  Dispo:  Patient From: Home  Planned Disposition: Home  Medically stable for discharge: No          Consultants:  CCM  Procedures:  None  Antimicrobials:  Ceftriaxone.   Subjective: She is feeling better, less sluggish.  Tolerating diet. Had BM   Objective: Vitals:   06/03/21 1950 06/03/21 2027 06/03/21 2355 06/04/21 0427  BP: 121/73  134/72 124/76  Pulse: 80  85 79  Resp: (!) _0 Temp: 98.2 F (36.8 C)   98.3 F (36.8 C) (!) 97.3 F (36.3 C)  TempSrc: Oral  Oral Oral  SpO2: 96% 97% 96% 99%  Weight:      Height:        Intake/Output Summary (Last 24 hours) at 06/04/2021 1657 Last data filed at 06/04/2021 0500 Gross per 24 hour  Intake 2746.01 ml  Output --  Net 2746.01 ml    Filed Weights   05/31/21 1707 05/31/21 2246  Weight: (!) 201.9 kg (!) 152.3 kg    Examination:  General exam: NAD Respiratory system: CTA Cardiovascular system: S 1, S 2 RRR Gastrointestinal system:  BS present, Soft, nt Central nervous system: Alert, follows command  Extremities: No edema     Data Reviewed: I have personally reviewed following labs and imaging studies  CBC: Recent Labs  Lab 05/31/21 1706 05/31/21 1725 05/31/21 1727 06/01/21 0546 06/02/21 0108 06/04/21 0240  WBC 12.1*  --   --  10.0 7.4 5.9  NEUTROABS 8.5*  --   --   --   --   --   HGB 14.3 16.0* 16.0* 12.4 12.2 10.6*  HCT 45.3 47.0* 47.0* 39.3 39.2 32.8*  MCV 87.1  --   --  85.1 86.5 87.0  PLT 271  --   --  216 200 140*    Basic Metabolic Panel: Recent Labs  Lab 06/01/21 0546 06/01/21 0806 06/01/21 2143 06/02/21 0108 06/02/21 0552 06/03/21 0139 06/04/21 0240  NA 142   < > 145 143 142 136 137  K 4.2   < > 4.5 4.2 4.2 3.8 3.7  CL 106   < > 108 106 108 103 103  CO2 21*   < > 21* 21* 21* 23 25  GLUCOSE 234*   < > 322* 363* 365* 368* 343*  BUN 29*   < > _1 CREATININE 1.50*   < > 1.45* 1.48* 1.55* 1.24* 1.20*  CALCIUM 8.9   < > 9.0 8.9 8.7* 8.5* 8.8*  MG 2.5*  --   --   --   --   --   --   PHOS 2.6  --   --   --   --   --   --    < > = values in this interval not displayed.    GFR: Estimated Creatinine Clearance: 85.1 mL/min (A) (by C-G formula based on SCr of 1.2 mg/dL (H)). Liver Function Tests: Recent Labs  Lab 05/31/21 1706 06/01/21 0546  AST 24 23  ALT 33 28  ALKPHOS 97 83  BILITOT 3.0* 2.0*  PROT 9.0* 7.8  ALBUMIN 3.9 3.3*    Recent Labs  Lab 05/31/21 1706 06/01/21 0947   LIPASE 100* 70*    Recent Labs  Lab 06/01/21 0947  AMMONIA 39*    Coagulation Profile: No results for input(s): INR, PROTIME in the last 168 hours. Cardiac Enzymes: No results for input(s): CKTOTAL, CKMB, CKMBINDEX, TROPONINI in the last 168 hours. BNP (last 3 results) No results for input(s): PROBNP in the last 8760 hours. HbA1C: No results for input(s): HGBA1C in the last 72 hours.  CBG:  Recent Labs  Lab 06/03/21 0617 06/03/21 1110 06/03/21 1630 06/03/21 2112 06/04/21 0624  GLUCAP 303* 342* 318* 344* 344*    Lipid Profile: No results for input(s): CHOL, HDL, LDLCALC, TRIG, CHOLHDL, LDLDIRECT in the last 72 hours. Thyroid Function Tests: No results for input(s): TSH, T4TOTAL, FREET4, T3FREE, THYROIDAB in the last 72 hours.  Anemia Panel: No results for input(s): VITAMINB12, FOLATE, FERRITIN, TIBC, IRON, RETICCTPCT in the last 72 hours. Sepsis Labs: Recent Labs  Lab 05/31/21 1713 06/01/21 0041  LATICACIDVEN 2.3* 2.4*     Recent Results (from the past 240 hour(s))  Resp Panel by RT-PCR (Flu A&B, Covid) Nasopharyngeal Swab     Status: None   Collection Time: 05/31/21  5:34 PM   Specimen: Nasopharyngeal Swab; Nasopharyngeal(NP) swabs in vial transport medium  Result Value Ref Range Status   SARS Coronavirus 2 by RT PCR NEGATIVE NEGATIVE Final    Comment: (NOTE) SARS-CoV-2 target nucleic acids are NOT DETECTED.  The SARS-CoV-2 RNA is generally detectable in upper respiratory specimens during the acute phase of infection. The lowest concentration of SARS-CoV-2 viral copies this assay can detect is 138 copies/mL. A negative result does not preclude SARS-Cov-2 infection and should not be used as the sole basis for treatment or other patient management decisions. A negative result may occur with  improper specimen collection/handling, submission of specimen other than nasopharyngeal swab, presence of viral mutation(s) within the areas targeted by this assay,  and inadequate number of viral copies(<138 copies/mL). A negative result must be combined with clinical observations, patient history, and epidemiological information. The expected result is Negative.  Fact Sheet for Patients:  EntrepreneurPulse.com.au  Fact Sheet for Healthcare Providers:  IncredibleEmployment.be  This test is no t yet approved or cleared by the [place] FDA and  has been authorized for detection and/or diagnosis of SARS-CoV-2 by FDA under an Emergency Use Authorization (EUA). This EUA will remain  in effect (meaning this test can be used) for the duration of the COVID-19 declaration under Section 564(b)(1) of the Act, 21 U.S.C.section 360bbb-3(b)(1), unless the authorization is terminated  or revoked sooner.       Influenza A by PCR NEGATIVE NEGATIVE Final   Influenza B by PCR NEGATIVE NEGATIVE Final    Comment: (NOTE) The Xpert Xpress SARS-CoV-2/FLU/RSV plus assay is intended as an aid in the diagnosis of influenza from Nasopharyngeal swab specimens and should not be used as a sole basis for treatment. Nasal washings and aspirates are unacceptable for Xpert Xpress SARS-CoV-2/FLU/RSV testing.  Fact Sheet for Patients: EntrepreneurPulse.com.au  Fact Sheet for Healthcare Providers: IncredibleEmployment.be  This test is not yet approved or cleared by the [place] FDA and has been authorized for detection and/or diagnosis of SARS-CoV-2 by FDA under an Emergency Use Authorization (EUA). This EUA will remain in effect (meaning this test can be used) for the duration of the COVID-19 declaration under Section 564(b)(1) of the Act, 21 U.S.C. section 360bbb-3(b)(1), unless the authorization is terminated or revoked.  Performed at [facility], [place] [address], [place], [place] [address]   Urine Culture     Status: Abnormal   Collection Time: 06/01/21  1:51 PM   Specimen:  Urine, Clean Catch  Result Value Ref Range Status   Specimen Description URINE, CLEAN CATCH  Final   Special Requests NONE  Final   Culture (A)  Final    <10,000 COLONIES/mL INSIGNIFICANT GROWTH Performed at [facility], [place] [address] [place], [place] [address]  Report Status 06/02/2021 FINAL  Final          Radiology Studies: No results found.      Scheduled Meds:  budesonide (PULMICORT) nebulizer solution  0.25 mg Nebulization BID   enoxaparin (LOVENOX) injection  0.5 mg/kg Subcutaneous Q24H   glipiZIDE  5 mg Oral QAC breakfast   insulin aspart  0-15 Units Subcutaneous TID WC   insulin aspart  0-5 Units Subcutaneous QHS   insulin aspart  8 Units Subcutaneous TID WC   insulin glargine-yfgn  50 Units Subcutaneous BID   insulin starter kit- pen needles  1 kit Other Once   levothyroxine  100 mcg Intravenous Daily   living well with diabetes book   Does not apply Once   montelukast  10 mg Oral Daily   polyethylene glycol  17 g Oral Daily   Continuous Infusions:  sodium chloride 100 mL/hr at 06/04/21 0805   cefTRIAXone (ROCEPHIN)  IV 2 g (06/04/21 0831)     LOS: 4 days    Time spent: 35 minutes.     Elmarie Shiley, MD [facility]   If 7PM-7AM, please contact night-coverage [email or web address]  06/04/2021, 8:42 AM

## 2021-06-05 ENCOUNTER — Inpatient Hospital Stay (HOSPITAL_COMMUNITY): Payer: 59

## 2021-06-05 DIAGNOSIS — K851 Biliary acute pancreatitis without necrosis or infection: Secondary | ICD-10-CM

## 2021-06-05 DIAGNOSIS — E039 Hypothyroidism, unspecified: Secondary | ICD-10-CM

## 2021-06-05 LAB — GLUCOSE, CAPILLARY
Glucose-Capillary: 307 mg/dL — ABNORMAL HIGH (ref 70–99)
Glucose-Capillary: 268 mg/dL — ABNORMAL HIGH (ref 70–99)
Glucose-Capillary: 221 mg/dL — ABNORMAL HIGH (ref 70–99)
Glucose-Capillary: 287 mg/dL — ABNORMAL HIGH (ref 70–99)

## 2021-06-05 LAB — BASIC METABOLIC PANEL
Anion gap: 9 (ref 5–15)
BUN: 13 mg/dL (ref 6–20)
CO2: 23 mmol/L (ref 22–32)
Calcium: 8.4 mg/dL — ABNORMAL LOW (ref 8.9–10.3)
Chloride: 103 mmol/L (ref 98–111)
Creatinine, Ser: 1.02 mg/dL — ABNORMAL HIGH (ref 0.44–1.00)
GFR, Estimated: >60 mL/min (ref >60)
Glucose, Bld: 362 mg/dL — ABNORMAL HIGH (ref 70–99)
Potassium: 3.7 mmol/L (ref 3.5–5.1)
Sodium: 135 mmol/L (ref 135–145)

## 2021-06-05 LAB — TSH: TSH: 65.496 u[IU]/mL — ABNORMAL HIGH (ref 0.350–4.500)

## 2021-06-05 LAB — URIC ACID: Uric Acid, Serum: 7.5 mg/dL — ABNORMAL HIGH (ref 2.5–7.1)

## 2021-06-05 MED ORDER — INSULIN GLARGINE-YFGN 100 UNIT/ML ~~LOC~~ SOLN
60.0000 [IU] | Freq: Two times a day (BID) | SUBCUTANEOUS | Status: DC
  Administered 2021-06-05 – 2021-06-07 (×4): 60 [IU] via SUBCUTANEOUS
  Filled 2021-06-05 (×5): qty 0.6

## 2021-06-05 MED ORDER — HYDRALAZINE HCL 20 MG/ML IJ SOLN: 10.0000 mg | INTRAMUSCULAR | Status: DC | PRN

## 2021-06-05 MED ORDER — DM-GUAIFENESIN ER 30-600 MG PO TB12: 1.0000 | ORAL_TABLET | Freq: Two times a day (BID) | ORAL | Status: DC | PRN

## 2021-06-05 MED ORDER — TRAZODONE HCL 50 MG PO TABS: 50.0000 mg | ORAL_TABLET | Freq: Every evening | ORAL | Status: DC | PRN

## 2021-06-05 MED ORDER — SENNOSIDES-DOCUSATE SODIUM 8.6-50 MG PO TABS: 1.0000 | ORAL_TABLET | Freq: Every evening | ORAL | Status: DC | PRN

## 2021-06-05 NOTE — TOC Benefit Eligibility Note (Signed)
Transition of Care [facility]) Benefit Eligibility Note    Patient Details  Name: Vanessa [name] MRN: [id number] Date of Birth: [birth year]   Medication/Dose: Vanessa [name], Vanessa [name]  for 30 day supply  Covered?: Yes  Tier: 3 Drug  Prescription Coverage Preferred Pharmacy: Vanessa [name] with Person/Company/Phone Number:: Vanessa [name]. W/[employer] PH# [phone]  Co-Pay: $100.00  [facility] Appro[facility])  Deductible: Unmet       Vanessa [name] Phone Number: 06/05/2021, 8:38 AM

## 2021-06-05 NOTE — Progress Notes (Signed)
PROGRESS NOTE    Vanessa [name]  WYO:[id number] DOB: November 12, 1970 DOA: 05/31/2021 PCP: Biagio Borg, MD   Brief Narrative:  50 year old with past medical history significant for lumbar spinal stenosis, morbid obesity, hypothyroidism, OSA, noncompliant with CPAP, prediabetic who presented to the ED due to altered mental status.  Last known well was on Wednesday afternoon.  Patient has been vomiting intermittently for the last 2 weeks, associated with abdominal pain. Evaluation in the ED patient was found to be in DKA, CBG 621, bicarb 9, anion gap of 33, beta hydroxybutyrate greater than 8 pH 7.2.  Patient has been transition to long acting insulin. CBG has been persistently in the 300., We are adjusting insulin regime. Home when CBG 200 range.    Assessment & Plan:   Active Problems:   DKA, type 2 ([place])    DKA type II: Anion gap metabolic acidosis Insulin-dependent diabetes mellitus type 2, uncontrolled due to hyperglycemia -DKA resolved - Current insulin regimen- Increase glargine 60 mg twice daily.  NovoLog 8 units Premeal's.  Sliding scale - Glipizide 5 mg daily - Seen by diabetic coordinator  Left ankle pain -Previous history of fracture.  Will order x-ray.  Uric acid level.  Acute metabolic encephalopathy -Combination of DKA, acidosis uncontrolled hypothyroidism.  Improved   Severe hypothyroidism -TSH greater than 60, T4 not checked - On Synthroid 100 mcg daily IV.  Transition to p.o. today - Will need repeat thyroid studies in 4 weeks   Urinary tract infection -Empirically treated with Rocephin 3 days.  Stop today   Blurry vision -CTA shows low-lying cerebellar tonsil.  Will need outpatient ophthalmology eval.  Previous provider spoke with neurology.   Acute kidney injury, resolved -Baseline creatinine 1.0, peaked at 2.3   Acute pancreatitis, uncomplicated -Elevated lipase.  CT abdomen pelvis showed some inflammation of pancreas.  Conservative management  Essential  hypertension  -Holding HCTZ and losartan due to AKI.  Slowly rising  Depression anxiety  Resume Celexa $RemoveBefor'20mg'EsvqUnFnRowf$   . Resume gabapentin 8/4 if tolerates celexa.      Hepatic steatosis by CT scan: Vanessa [name] will need MRI once stable as an outpatient   Estimated body mass index is 55.87 kg/m as calculated from the following:   Height as of this encounter: $RemoveBeforeD'5\' 5"'DIlvUQLPIpyZmZ$  (1.651 m).   Weight as of this encounter: 152.3 kg.  Vanessa [name] needs outpatient Endocrinology referral.    DVT prophylaxis: SCDs Start: 05/31/21 2110 Code Status: Full Family Communication:  Called Colin Mulders  Status is: Inpatient  Remains inpatient appropriate because:Inpatient level of care appropriate due to severity of illness  Dispo:  Patient From: Home  Planned Disposition: Home  Medically stable for discharge: No         Subjective: Feels ok, no complaints at this time.   Review of Systems Otherwise negative except as per HPI, including: General: Denies fever, chills, night sweats or unintended weight loss. Resp: Denies cough, wheezing, shortness of breath. Cardiac: Denies chest pain, palpitations, orthopnea, paroxysmal nocturnal dyspnea. GI: Denies abdominal pain, nausea, vomiting, diarrhea or constipation GU: Denies dysuria, frequency, hesitancy or incontinence MS: Denies muscle aches, joint pain or swelling Neuro: Denies headache, neurologic deficits (focal weakness, numbness, tingling), abnormal gait Psych: Denies anxiety, depression, SI/HI/AVH Skin: Denies new rashes or lesions ID: Denies sick contacts, exotic exposures, travel  Examination:  General exam: Appears calm and comfortable  Respiratory system: Clear to auscultation. Respiratory effort normal. Cardiovascular system: S1 & S2 heard, RRR. No JVD, murmurs, rubs, gallops or clicks. No pedal edema.  Gastrointestinal system: Abdomen is nondistended, soft and nontender. No organomegaly or masses felt. Normal bowel sounds heard. Central nervous system: Alert and  oriented. No focal neurological deficits. Extremities: Symmetric 5 x 5 power. Skin: No rashes, lesions or ulcers Psychiatry: Judgement and insight appear normal. Mood & affect appropriate.     Objective: Vitals:   06/04/21 2057 06/05/21 0038 06/05/21 0425 06/05/21 0753  BP:  120/66 117/68 124/80  Pulse: 88 80 74 76  Resp: $Remo'18 18 18 19  'GkItJ$ Temp:  98.4 F (36.9 C) 98.1 F (36.7 C) 97.7 F (36.5 C)  TempSrc:  Oral Oral Oral  SpO2: 100% 100% 98% 99%  Weight:      Height:        Intake/Output Summary (Last 24 hours) at 06/05/2021 0902 Last data filed at 06/05/2021 0600 Gross per 24 hour  Intake 3547.84 ml  Output --  Net 3547.84 ml   Filed Weights   05/31/21 1707 05/31/21 2246  Weight: (!) 201.9 kg (!) 152.3 kg     Data Reviewed:   CBC: Recent Labs  Lab 05/31/21 1706 05/31/21 1725 05/31/21 1727 06/01/21 0546 06/02/21 0108 06/04/21 0240  WBC 12.1*  --   --  10.0 7.4 5.9  NEUTROABS 8.5*  --   --   --   --   --   HGB 14.3 16.0* 16.0* 12.4 12.2 10.6*  HCT 45.3 47.0* 47.0* 39.3 39.2 32.8*  MCV 87.1  --   --  85.1 86.5 87.0  PLT 271  --   --  216 200 015*   Basic Metabolic Panel: Recent Labs  Lab 06/01/21 0546 06/01/21 0806 06/02/21 0108 06/02/21 0552 06/03/21 0139 06/04/21 0240 06/05/21 0100  NA 142   < > 143 142 136 137 135  K 4.2   < > 4.2 4.2 3.8 3.7 3.7  CL 106   < > 106 108 103 103 103  CO2 21*   < > 21* 21* $Remov'23 25 23  'KASQzx$ GLUCOSE 234*   < > 363* 365* 368* 343* 362*  BUN 29*   < > $R'19 20 15 10 13  'EB$ CREATININE 1.50*   < > 1.48* 1.55* 1.24* 1.20* 1.02*  CALCIUM 8.9   < > 8.9 8.7* 8.5* 8.8* 8.4*  MG 2.5*  --   --   --   --   --   --   PHOS 2.6  --   --   --   --   --   --    < > = values in this interval not displayed.   GFR: Estimated Creatinine Clearance: 100.2 mL/min (A) (by C-G formula based on SCr of 1.02 mg/dL (H)). Liver Function Tests: Recent Labs  Lab 05/31/21 1706 06/01/21 0546  AST 24 23  ALT 33 28  ALKPHOS 97 83  BILITOT 3.0* 2.0*  PROT 9.0*  7.8  ALBUMIN 3.9 3.3*   Recent Labs  Lab 05/31/21 1706 06/01/21 0947  LIPASE 100* 70*   Recent Labs  Lab 06/01/21 0947  AMMONIA 39*   Coagulation Profile: No results for input(s): INR, PROTIME in the last 168 hours. Cardiac Enzymes: No results for input(s): CKTOTAL, CKMB, CKMBINDEX, TROPONINI in the last 168 hours. BNP (last 3 results) No results for input(s): PROBNP in the last 8760 hours. HbA1C: No results for input(s): HGBA1C in the last 72 hours. CBG: Recent Labs  Lab 06/04/21 0624 06/04/21 1103 06/04/21 1644 06/04/21 2156 06/05/21 0618  GLUCAP 344* 308* 280* 338* 307*   Lipid  Profile: No results for input(s): CHOL, HDL, LDLCALC, TRIG, CHOLHDL, LDLDIRECT in the last 72 hours. Thyroid Function Tests: No results for input(s): TSH, T4TOTAL, FREET4, T3FREE, THYROIDAB in the last 72 hours. Anemia Panel: No results for input(s): VITAMINB12, FOLATE, FERRITIN, TIBC, IRON, RETICCTPCT in the last 72 hours. Sepsis Labs: Recent Labs  Lab 05/31/21 1713 06/01/21 0041  LATICACIDVEN 2.3* 2.4*    Recent Results (from the past 240 hour(s))  Resp Panel by RT-PCR (Flu A&B, Covid) Nasopharyngeal Swab     Status: None   Collection Time: 05/31/21  5:34 PM   Specimen: Nasopharyngeal Swab; Nasopharyngeal(NP) swabs in vial transport medium  Result Value Ref Range Status   SARS Coronavirus 2 by RT PCR NEGATIVE NEGATIVE Final    Comment: (NOTE) SARS-CoV-2 target nucleic acids are NOT DETECTED.  The SARS-CoV-2 RNA is generally detectable in upper respiratory specimens during the acute phase of infection. The lowest concentration of SARS-CoV-2 viral copies this assay can detect is 138 copies/mL. A negative result does not preclude SARS-Cov-2 infection and should not be used as the sole basis for treatment or other patient management decisions. A negative result may occur with  improper specimen collection/handling, submission of specimen other than nasopharyngeal swab, presence of  viral mutation(s) within the areas targeted by this assay, and inadequate number of viral copies(<138 copies/mL). A negative result must be combined with clinical observations, patient history, and epidemiological information. The expected result is Negative.  Fact Sheet for Patients:  EntrepreneurPulse.com.au  Fact Sheet for Healthcare Providers:  IncredibleEmployment.be  This test is no t yet approved or cleared by the [place] FDA and  has been authorized for detection and/or diagnosis of SARS-CoV-2 by FDA under an Emergency Use Authorization (EUA). This EUA will remain  in effect (meaning this test can be used) for the duration of the COVID-19 declaration under Section 564(b)(1) of the Act, 21 U.S.C.section 360bbb-3(b)(1), unless the authorization is terminated  or revoked sooner.       Influenza A by PCR NEGATIVE NEGATIVE Final   Influenza B by PCR NEGATIVE NEGATIVE Final    Comment: (NOTE) The Xpert Xpress SARS-CoV-2/FLU/RSV plus assay is intended as an aid in the diagnosis of influenza from Nasopharyngeal swab specimens and should not be used as a sole basis for treatment. Nasal washings and aspirates are unacceptable for Xpert Xpress SARS-CoV-2/FLU/RSV testing.  Fact Sheet for Patients: EntrepreneurPulse.com.au  Fact Sheet for Healthcare Providers: IncredibleEmployment.be  This test is not yet approved or cleared by the [place] FDA and has been authorized for detection and/or diagnosis of SARS-CoV-2 by FDA under an Emergency Use Authorization (EUA). This EUA will remain in effect (meaning this test can be used) for the duration of the COVID-19 declaration under Section 564(b)(1) of the Act, 21 U.S.C. section 360bbb-3(b)(1), unless the authorization is terminated or revoked.  Performed at [facility], [address], [place], [place] [address]   Urine Culture     Status:  Abnormal   Collection Time: 06/01/21  1:51 PM   Specimen: Urine, Clean Catch  Result Value Ref Range Status   Specimen Description URINE, CLEAN CATCH  Final   Special Requests NONE  Final   Culture (A)  Final    <10,000 COLONIES/mL INSIGNIFICANT GROWTH Performed at [facility], [place] [address] [place], [place] [address]    Report Status 06/02/2021 FINAL  Final         Radiology Studies: No results found.      Scheduled  Meds:  budesonide (PULMICORT) nebulizer solution  0.25 mg Nebulization BID   citalopram  20 mg Oral Daily   enoxaparin (LOVENOX) injection  0.5 mg/kg Subcutaneous Q24H   glipiZIDE  5 mg Oral QAC breakfast   insulin aspart  0-15 Units Subcutaneous TID WC   insulin aspart  0-5 Units Subcutaneous QHS   insulin aspart  8 Units Subcutaneous TID WC   insulin glargine-yfgn  50 Units Subcutaneous BID   insulin starter kit- pen needles  1 kit Other Once   levothyroxine  200 mcg Oral QAC breakfast   living well with diabetes book   Does not apply Once   montelukast  10 mg Oral Daily   polyethylene glycol  17 g Oral Daily   Continuous Infusions:  sodium chloride 100 mL/hr at 06/05/21 0721     LOS: 5 days   Time spent= 35 mins    Madeleyn Schwimmer Arsenio Loader, MD [facility]  If 7PM-7AM, please contact night-coverage  06/05/2021, 9:02 AM

## 2021-06-05 NOTE — Progress Notes (Signed)
Physical Therapy Treatment Patient Details Name: Vanessa [name] MRN: [id number] DOB: January 12, 1971 Today's Date: 06/05/2021    History of Present Illness Vanessa [name] is a 50 y.o. female who presented with AMS. Pt with intermittent vomiting for 2 weeks prior to admission. Found to have blood glucose of 641, new DM type II. Pt admitted for DKA protocol. PMH: lumbar spinal stenosis, morbid obesity, hypothyroidism, OSA.    PT Comments    Patient received in bed, reports fatigue and left foot pain. Reports she is supposed to have foot looked at today. Performed bed mobility with mod independence. Ambulated initially with 4 wheeled bariatric walker, patient did not like and therefore switched to regular walker. Patient transfers with mod independence. Mobility limited by fatigue and left foot pain this session. She will continue to benefit from skilled PT while here to improve independence and safety .       Follow Up Recommendations  No PT follow up;Supervision for mobility/OOB     Equipment Recommendations  Rolling walker with 5" wheels;Other (comment) (bariatric)    Recommendations for Other Services       Precautions / Restrictions Precautions Precautions: Fall Precaution Comments: hx of L ankle/foot pain Restrictions Weight Bearing Restrictions: No    Mobility  Bed Mobility Overal bed mobility: Modified Independent Bed Mobility: Supine to Sit     Supine to sit: Modified independent (Device/Increase time);HOB elevated     General bed mobility comments: increased time/effort, use of bed rails    Transfers Overall transfer level: Modified independent Equipment used: 4-wheeled walker Transfers: Sit to/from Stand Sit to Stand: Modified independent (Device/Increase time)            Ambulation/Gait Ambulation/Gait assistance: Min guard Gait Distance (Feet): 60 Feet Assistive device: Rolling walker (2 wheeled) Gait Pattern/deviations: Step-through pattern;Decreased  weight shift to left;Decreased step length - right;Decreased step length - left;Trunk flexed Gait velocity: decreased   General Gait Details: Patient with labored gait. Stopping occasionally due to fatigue L foot pain. Tried bariatric rollator. Patient reports she does not feel as comfortable with that and wants to use regualr walker. Patient needs bariatric RW in room.   Stairs             Wheelchair Mobility    Modified Rankin (Stroke Patients Only)       Balance Overall balance assessment: Needs assistance Sitting-balance support: Feet supported Sitting balance-Leahy Scale: Normal     Standing balance support: Bilateral upper extremity supported;During functional activity Standing balance-Leahy Scale: Fair Standing balance comment: fair static standing at sink, needs at least one UE support for mobility                            Cognition Arousal/Alertness: Awake/alert Behavior During Therapy: WFL for tasks assessed/performed Overall Cognitive Status: Within Functional Limits for tasks assessed                                        Exercises      General Comments        Pertinent Vitals/Pain Pain Assessment: 0-10 Pain Score: 8  Pain Location: L ankle/foot with weightbearing Pain Descriptors / Indicators: Guarding;Grimacing;Sore;Discomfort Pain Intervention(s): Monitored during session;Limited activity within patient's tolerance    Home Living  Prior Function            PT Goals (current goals can now be found in the care plan section) Acute Rehab PT Goals Patient Stated Goal: decrease ankle pain, be able to return home PT Goal Formulation: With patient Time For Goal Achievement: 06/17/21 Potential to Achieve Goals: Good Progress towards PT goals: Progressing toward goals    Frequency    Min 3X/week      PT Plan Current plan remains appropriate    Co-evaluation               AM-PAC PT "6 Clicks" Mobility   Outcome Measure  Help needed turning from your back to your side while in a flat bed without using bedrails?: None Help needed moving from lying on your back to sitting on the side of a flat bed without using bedrails?: A Little Help needed moving to and from a bed to a chair (including a wheelchair)?: None Help needed standing up from a chair using your arms (e.g., wheelchair or bedside chair)?: None Help needed to walk in hospital room?: A Little Help needed climbing 3-5 steps with a railing? : A Lot 6 Click Score: 20    End of Session   Activity Tolerance: Patient limited by fatigue;Patient limited by pain Patient left: in bed;with call bell/phone within reach;Other (comment) (lab came to get blood) Nurse Communication: Mobility status PT Visit Diagnosis: Unsteadiness on feet (R26.81);Other abnormalities of gait and mobility (R26.89);Muscle weakness (generalized) (M62.81);Difficulty in walking, not elsewhere classified (R26.2);Pain Pain - Right/Left: Left Pain - part of body: Ankle and joints of foot     Time: 4193-7902 PT Time Calculation (min) (ACUTE ONLY): 26 min  Charges:  $Gait Training: 23-37 mins                    Charnee Turnipseed, PT, GCS 06/05/21,10:19 AM

## 2021-06-05 NOTE — Progress Notes (Signed)
Occupational Therapy Treatment Patient Details Name: Vanessa [name] MRN: [id number] DOB: [birth year] Today's Date: 06/05/2021    History of present illness Vanessa [name] is a 50 y.o. female who presented with AMS. Pt with intermittent vomiting for 2 weeks prior to admission. Found to have blood glucose of 641, new DM type II. Pt admitted for DKA protocol. PMH: lumbar spinal stenosis, morbid obesity, hypothyroidism, OSA.   OT comments  Pt not making progress this session due to reporting she is too fatigued to participate in OOB activities. Agreeable to few exercises listed below as well as grooming. Pt repositioned in bed and set up for dinner. OT will follow acutely.    Follow Up Recommendations  No OT follow up    Equipment Recommendations  Other (comment)    Recommendations for Other Services      Precautions / Restrictions Precautions Precautions: Fall Precaution Comments: hx of L ankle/foot pain Restrictions Weight Bearing Restrictions: No       Mobility Bed Mobility Overal bed mobility: Modified Independent             General bed mobility comments: increased time/effort, use of bed rails    Transfers                 General transfer comment: deferred    Balance Overall balance assessment: Needs assistance Sitting-balance support: Feet supported Sitting balance-Leahy Scale: Normal         Standing balance comment: deferred                           ADL either performed or assessed with clinical judgement   ADL Overall ADL's : Needs assistance/impaired     Grooming: Wash/dry hands;Wash/dry face;Set up;Sitting Grooming Details (indicate cue type and reason): completed sitting EOB                               General ADL Comments: Pt deferred OOB tasks this session, worked on bed mobility and grooming     Vision       Perception     Praxis      Cognition Arousal/Alertness: Awake/alert Behavior During  Therapy: WFL for tasks assessed/performed Overall Cognitive Status: Within Functional Limits for tasks assessed                                          Exercises Exercises: Other exercises Other Exercises Other Exercises: crunches using bed rails to pull forward, x5 Other Exercises: oblique twists using bed rails to pull across x5 Other Exercises: overheads using bed rails, x5   Shoulder Instructions       General Comments VSS on RA, LLE swollen    Pertinent Vitals/ Pain       Pain Assessment: 0-10 Pain Score: 3  Pain Location: L ankle/foot with weightbearing Pain Descriptors / Indicators: Guarding;Grimacing;Sore;Discomfort Pain Intervention(s): Limited activity within patient's tolerance;Monitored during session;Repositioned  Home Living                                          Prior Functioning/Environment              Frequency  Min 2X/week  Progress Toward Goals  OT Goals(current goals can now be found in the care plan section)  Progress towards OT goals: Not progressing toward goals - comment  Acute Rehab OT Goals Patient Stated Goal: I want to go home OT Goal Formulation: With patient Time For Goal Achievement: 06/17/21 Potential to Achieve Goals: Good ADL Goals Pt Will Perform Lower Body Bathing: with modified independence;sit to/from stand Pt Will Transfer to Toilet: with modified independence;ambulating Pt Will Perform Tub/Shower Transfer: Tub transfer;with set-up;ambulating  Plan Discharge plan remains appropriate;Frequency remains appropriate    Co-evaluation                 AM-PAC OT "6 Clicks" Daily Activity     Outcome Measure   Help from another person eating meals?: None Help from another person taking care of personal grooming?: A Little Help from another person toileting, which includes using toliet, bedpan, or urinal?: A Little Help from another person bathing (including washing,  rinsing, drying)?: A Little Help from another person to put on and taking off regular upper body clothing?: None Help from another person to put on and taking off regular lower body clothing?: A Little 6 Click Score: 20    End of Session    OT Visit Diagnosis: Unsteadiness on feet (R26.81);Other abnormalities of gait and mobility (R26.89);Pain Pain - Right/Left: Left Pain - part of body: Ankle and joints of foot   Activity Tolerance Patient tolerated treatment well   Patient Left in bed;with call bell/phone within reach   Nurse Communication Mobility status        Time: 3818-2993 OT Time Calculation (min): 12 min  Charges: OT General Charges $OT Visit: 1 Visit OT Treatments $Self Care/Home Management : 8-22 mins  Avaya Mcjunkins H., OTR/L Acute Rehabilitation  [place] Bing [place]ume 06/05/2021, 5:29 PM

## 2021-06-05 NOTE — Care Management (Signed)
1446 06-05-21 Case Manager spoke with patient regarding durable medical equipment (DME) for home. Patient is in need of a bariatric rolling walker. Patient is agreeable for Case Manager to call [employer] with the referral. DME rolling walker will be delivered to the room prior to transition home. No further home needs identified.

## 2021-06-05 NOTE — Care Management (Signed)
8628 06-05-21 Benefits check submitted for Lantus, Semgle[employer] vs. Levemir. Case Manager will follow for cost.

## 2021-06-06 DIAGNOSIS — Z794 Long term (current) use of insulin: Secondary | ICD-10-CM

## 2021-06-06 DIAGNOSIS — E1169 Type 2 diabetes mellitus with other specified complication: Secondary | ICD-10-CM

## 2021-06-06 LAB — GLUCOSE, CAPILLARY
Glucose-Capillary: 285 mg/dL — ABNORMAL HIGH (ref 70–99)
Glucose-Capillary: 194 mg/dL — ABNORMAL HIGH (ref 70–99)
Glucose-Capillary: 212 mg/dL — ABNORMAL HIGH (ref 70–99)
Glucose-Capillary: 188 mg/dL — ABNORMAL HIGH (ref 70–99)

## 2021-06-06 LAB — CBC
HCT: 29.9 % — ABNORMAL LOW (ref 36.0–46.0)
Hemoglobin: 9.6 g/dL — ABNORMAL LOW (ref 12.0–15.0)
MCH: 28.1 pg (ref 26.0–34.0)
MCHC: 32.1 g/dL (ref 30.0–36.0)
MCV: 87.4 fL (ref 80.0–100.0)
Platelets: 157 10*3/uL (ref 150–400)
RBC: 3.42 MIL/uL — ABNORMAL LOW (ref 3.87–5.11)
RDW: 22.5 % — ABNORMAL HIGH (ref 11.5–15.5)
WBC: 6.8 10*3/uL (ref 4.0–10.5)
nRBC: 0 % (ref 0.0–0.2)

## 2021-06-06 LAB — BASIC METABOLIC PANEL
Anion gap: 9 (ref 5–15)
BUN: 10 mg/dL (ref 6–20)
CO2: 22 mmol/L (ref 22–32)
Calcium: 8.4 mg/dL — ABNORMAL LOW (ref 8.9–10.3)
Chloride: 103 mmol/L (ref 98–111)
Creatinine, Ser: 0.82 mg/dL (ref 0.44–1.00)
GFR, Estimated: >60 mL/min (ref >60)
Glucose, Bld: 319 mg/dL — ABNORMAL HIGH (ref 70–99)
Potassium: 4.1 mmol/L (ref 3.5–5.1)
Sodium: 134 mmol/L — ABNORMAL LOW (ref 135–145)

## 2021-06-06 LAB — MAGNESIUM: Magnesium: 1.6 mg/dL — ABNORMAL LOW (ref 1.7–2.4)

## 2021-06-06 MED ORDER — MAGNESIUM SULFATE 2 GM/50ML IV SOLN
2.0000 g | Freq: Once | INTRAVENOUS | Status: AC
  Administered 2021-06-06: 2 g via INTRAVENOUS
  Filled 2021-06-06: qty 50

## 2021-06-06 MED ORDER — GLIPIZIDE 10 MG PO TABS
10.0000 mg | ORAL_TABLET | Freq: Every day | ORAL | Status: DC
  Administered 2021-06-07: 10 mg via ORAL
  Filled 2021-06-06: qty 1

## 2021-06-06 MED ORDER — GLIPIZIDE 5 MG PO TABS
5.0000 mg | ORAL_TABLET | Freq: Once | ORAL | Status: AC
  Administered 2021-06-06: 5 mg via ORAL
  Filled 2021-06-06: qty 1

## 2021-06-06 NOTE — Progress Notes (Signed)
PROGRESS NOTE    Vanessa [name]  BLT:[id number] DOB: October 13, 1971 DOA: 05/31/2021 PCP: Biagio Borg, MD   Brief Narrative:  50 year old with past medical history significant for lumbar spinal stenosis, morbid obesity, hypothyroidism, OSA, noncompliant with CPAP, prediabetic who presented to the ED due to altered mental status.  Last known well was on Wednesday afternoon.  Patient has been vomiting intermittently for the last 2 weeks, associated with abdominal pain. Evaluation in the ED patient was found to be in DKA, CBG 621, bicarb 9, anion gap of 33, beta hydroxybutyrate greater than 8 pH 7.2.  Patient has been transition to long acting insulin. CBG has been persistently in the 300., We are adjusting insulin regime. Home when CBG 200 range.    Assessment & Plan:   Active Problems:   DKA, type 2 ([place])    DKA type II: Anion gap metabolic acidosis Insulin-dependent diabetes mellitus type 2, uncontrolled due to hyperglycemia -DKA resolved - Current insulin regimen- Increase glargine 60 mg BID.  NovoLog 8 units Premeal's.  Sliding scale - Increase Glipizide to $RemoveBefo'10mg'djJbBHhLiUg$  po daily.  - Seen by diabetic coordinator  Left ankle pain -Previous history of fracture.  XR negative  Acute metabolic encephalopathy -Combination of DKA, acidosis uncontrolled hypothyroidism.  Improved   Severe hypothyroidism -TSH greater than 60, T4 not checked - On Synthroid 276mcg po daily.  - Will need repeat thyroid studies in 4 weeks   Urinary tract infection -Completed 3 days of Roc on 8/3   Blurry vision -CTA shows low-lying cerebellar tonsil.  Will need outpatient ophthalmology eval.  Previous provider spoke with neurology.   Acute kidney injury, resolved -Baseline creatinine 1.0, peaked at 2.3   Acute pancreatitis, uncomplicated -Elevated lipase.  CT abdomen pelvis showed some inflammation of pancreas.  Conservative management  Essential hypertension  -Holding HCTZ and losartan due to AKI.  Slowly  rising  Depression anxiety  Resume Celexa $RemoveBefor'20mg'ITsIcdfEZbjt$   . Resume gabapentin 8/4 if tolerates celexa.      Hepatic steatosis by CT scan: She will need MRI once stable as an outpatient   Estimated body mass index is 55.87 kg/m as calculated from the following:   Height as of this encounter: $RemoveBeforeD'5\' 5"'QwyCvUmpsvikXj$  (1.651 m).   Weight as of this encounter: 152.3 kg.  She needs outpatient Endocrinology referral.    DVT prophylaxis: SCDs Start: 05/31/21 2110 Code Status: Full Family Communication:  Called Colin Mulders- no answer.   Status is: Inpatient  Remains inpatient appropriate because:Inpatient level of care appropriate due to severity of illness  Dispo:  Patient From: Home  Planned Disposition: Home  Medically stable for discharge: No. Hopefully home tomorrow if BG is better.          Subjective: Blood glucose still in high 200s range. Fluids.  Improved insulin regimen Admitted for fluid therefore she will call with any Sjogren's.  I written down the names of different insulin.  Review of Systems Otherwise negative except as per HPI, including: General = no fevers, chills, dizziness,  fatigue HEENT/EYES = negative for loss of vision, double vision, blurred vision,  sore throa Cardiovascular= negative for chest pain, palpitation Respiratory/lungs= negative for shortness of breath, cough, wheezing; hemoptysis,  Gastrointestinal= negative for nausea, vomiting, abdominal pain Genitourinary= negative for Dysuria MSK = Negative for arthralgia, myalgias Neurology= Negative for headache, numbness, tingling  Psychiatry= Negative for suicidal and homocidal ideation Skin= Negative for Rash   Examination:  Constitutional: Not in acute distress Respiratory: Clear to auscultation bilaterally Cardiovascular: Normal sinus  rhythm, no rubs Abdomen: Nontender nondistended good bowel sounds Musculoskeletal: No edema noted Skin: No rashes seen Neurologic: CN 2-12 grossly intact.  And nonfocal Psychiatric:  Normal judgment and insight. Alert and oriented x 3. Normal mood.     Objective: Vitals:   06/05/21 1900 06/05/21 2319 06/06/21 0300 06/06/21 0737  BP: 124/77 (!) 107/54 123/75 (!) 111/58  Pulse: 74 82 72 69  Resp: $Remo'19 17 19 19  'aWZRe$ Temp: 98 F (36.7 C) 98 F (36.7 C) 97.8 F (36.6 C) 98.2 F (36.8 C)  TempSrc: Oral Oral Oral Oral  SpO2: 98% 98% 95% 96%  Weight:      Height:        Intake/Output Summary (Last 24 hours) at 06/06/2021 1212 Last data filed at 06/06/2021 1017 Gross per 24 hour  Intake 2731.94 ml  Output --  Net 2731.94 ml   Filed Weights   05/31/21 1707 05/31/21 2246  Weight: (!) 201.9 kg (!) 152.3 kg     Data Reviewed:   CBC: Recent Labs  Lab 05/31/21 1706 05/31/21 1725 05/31/21 1727 06/01/21 0546 06/02/21 0108 06/04/21 0240 06/06/21 0220  WBC 12.1*  --   --  10.0 7.4 5.9 6.8  NEUTROABS 8.5*  --   --   --   --   --   --   HGB 14.3   < > 16.0* 12.4 12.2 10.6* 9.6*  HCT 45.3   < > 47.0* 39.3 39.2 32.8* 29.9*  MCV 87.1  --   --  85.1 86.5 87.0 87.4  PLT 271  --   --  216 200 140* 157   < > = values in this interval not displayed.   Basic Metabolic Panel: Recent Labs  Lab 06/01/21 0546 06/01/21 0806 06/02/21 0552 06/03/21 0139 06/04/21 0240 06/05/21 0100 06/06/21 0220  NA 142   < > 142 136 137 135 134*  K 4.2   < > 4.2 3.8 3.7 3.7 4.1  CL 106   < > 108 103 103 103 103  CO2 21*   < > 21* $Rem'23 25 23 22  'oQBp$ GLUCOSE 234*   < > 365* 368* 343* 362* 319*  BUN 29*   < > $R'20 15 10 13 10  'uH$ CREATININE 1.50*   < > 1.55* 1.24* 1.20* 1.02* 0.82  CALCIUM 8.9   < > 8.7* 8.5* 8.8* 8.4* 8.4*  MG 2.5*  --   --   --   --   --  1.6*  PHOS 2.6  --   --   --   --   --   --    < > = values in this interval not displayed.   GFR: Estimated Creatinine Clearance: 124.6 mL/min (by C-G formula based on SCr of 0.82 mg/dL). Liver Function Tests: Recent Labs  Lab 05/31/21 1706 06/01/21 0546  AST 24 23  ALT 33 28  ALKPHOS 97 83  BILITOT 3.0* 2.0*  PROT 9.0* 7.8   ALBUMIN 3.9 3.3*   Recent Labs  Lab 05/31/21 1706 06/01/21 0947  LIPASE 100* 70*   Recent Labs  Lab 06/01/21 0947  AMMONIA 39*   Coagulation Profile: No results for input(s): INR, PROTIME in the last 168 hours. Cardiac Enzymes: No results for input(s): CKTOTAL, CKMB, CKMBINDEX, TROPONINI in the last 168 hours. BNP (last 3 results) No results for input(s): PROBNP in the last 8760 hours. HbA1C: No results for input(s): HGBA1C in the last 72 hours. CBG: Recent Labs  Lab 06/05/21 0618 06/05/21 1142  06/05/21 1623 06/05/21 2016 06/06/21 0629  GLUCAP 307* 268* 221* 287* 285*   Lipid Profile: No results for input(s): CHOL, HDL, LDLCALC, TRIG, CHOLHDL, LDLDIRECT in the last 72 hours. Thyroid Function Tests: Recent Labs    06/05/21 1013  TSH 65.496*   Anemia Panel: No results for input(s): VITAMINB12, FOLATE, FERRITIN, TIBC, IRON, RETICCTPCT in the last 72 hours. Sepsis Labs: Recent Labs  Lab 05/31/21 1713 06/01/21 0041  LATICACIDVEN 2.3* 2.4*    Recent Results (from the past 240 hour(s))  Resp Panel by RT-PCR (Flu A&B, Covid) Nasopharyngeal Swab     Status: None   Collection Time: 05/31/21  5:34 PM   Specimen: Nasopharyngeal Swab; Nasopharyngeal(NP) swabs in vial transport medium  Result Value Ref Range Status   SARS Coronavirus 2 by RT PCR NEGATIVE NEGATIVE Final    Comment: (NOTE) SARS-CoV-2 target nucleic acids are NOT DETECTED.  The SARS-CoV-2 RNA is generally detectable in upper respiratory specimens during the acute phase of infection. The lowest concentration of SARS-CoV-2 viral copies this assay can detect is 138 copies/mL. A negative result does not preclude SARS-Cov-2 infection and should not be used as the sole basis for treatment or other patient management decisions. A negative result may occur with  improper specimen collection/handling, submission of specimen other than nasopharyngeal swab, presence of viral mutation(s) within the areas  targeted by this assay, and inadequate number of viral copies(<138 copies/mL). A negative result must be combined with clinical observations, patient history, and epidemiological information. The expected result is Negative.  Fact Sheet for Patients:  EntrepreneurPulse.com.au  Fact Sheet for Healthcare Providers:  IncredibleEmployment.be  This test is no t yet approved or cleared by the [place] FDA and  has been authorized for detection and/or diagnosis of SARS-CoV-2 by FDA under an Emergency Use Authorization (EUA). This EUA will remain  in effect (meaning this test can be used) for the duration of the COVID-19 declaration under Section 564(b)(1) of the Act, 21 U.S.C.section 360bbb-3(b)(1), unless the authorization is terminated  or revoked sooner.       Influenza A by PCR NEGATIVE NEGATIVE Final   Influenza B by PCR NEGATIVE NEGATIVE Final    Comment: (NOTE) The Xpert Xpress SARS-CoV-2/FLU/RSV plus assay is intended as an aid in the diagnosis of influenza from Nasopharyngeal swab specimens and should not be used as a sole basis for treatment. Nasal washings and aspirates are unacceptable for Xpert Xpress SARS-CoV-2/FLU/RSV testing.  Fact Sheet for Patients: EntrepreneurPulse.com.au  Fact Sheet for Healthcare Providers: IncredibleEmployment.be  This test is not yet approved or cleared by the [place] FDA and has been authorized for detection and/or diagnosis of SARS-CoV-2 by FDA under an Emergency Use Authorization (EUA). This EUA will remain in effect (meaning this test can be used) for the duration of the COVID-19 declaration under Section 564(b)(1) of the Act, 21 U.S.C. section 360bbb-3(b)(1), unless the authorization is terminated or revoked.  Performed at [facility], [place] [address] [place], [place] [address]   Urine Culture     Status: Abnormal   Collection Time: 06/01/21   1:51 PM   Specimen: Urine, Clean Catch  Result Value Ref Range Status   Specimen Description URINE, CLEAN CATCH  Final   Special Requests NONE  Final   Culture (A)  Final    <10,000 COLONIES/mL INSIGNIFICANT GROWTH Performed at [facility], [place] [address], [place],  [address]    Report Status 06/02/2021 FINAL  Final  Radiology Studies: DG Ankle Left Port  Result Date: 06/05/2021 CLINICAL DATA:  general left ankle pain and swelling x 1-2 weeks. Denies injury to ankle. Pt states she has a hx of a hairline fracture in same ankle in 2008 and had to wear a boot on her left foot for treatment EXAM: PORTABLE LEFT ANKLE - 2 VIEW COMPARISON:  None. FINDINGS: No cortical erosion or destruction. There is no evidence of fracture or dislocation. Old healed distal fibular fracture versus degenerative changes. There is no evidence of arthropathy or other focal bone abnormality. Diffuse subcutaneus soft tissue edema. IMPRESSION: Negative for acute traumatic injury. Electronically Signed   By: Iven Finn M.D.   On: 06/05/2021 15:17        Scheduled Meds:  budesonide (PULMICORT) nebulizer solution  0.25 mg Nebulization BID   citalopram  20 mg Oral Daily   enoxaparin (LOVENOX) injection  0.5 mg/kg Subcutaneous Q24H   [START ON 06/07/2021] glipiZIDE  10 mg Oral QAC breakfast   glipiZIDE  5 mg Oral Once   insulin aspart  0-15 Units Subcutaneous TID WC   insulin aspart  0-5 Units Subcutaneous QHS   insulin aspart  8 Units Subcutaneous TID WC   insulin glargine-yfgn  60 Units Subcutaneous BID   insulin starter kit- pen needles  1 kit Other Once   levothyroxine  200 mcg Oral QAC breakfast   living well with diabetes book   Does not apply Once   montelukast  10 mg Oral Daily   polyethylene glycol  17 g Oral Daily   Continuous Infusions:  sodium chloride 100 mL/hr at 06/06/21 0236     LOS: 6 days   Time spent= 35 mins    Milyn Stapleton Arsenio Loader, MD [facility]  If  7PM-7AM, please contact night-coverage  06/06/2021, 12:12 PM

## 2021-06-07 LAB — BASIC METABOLIC PANEL
Anion gap: 6 (ref 5–15)
BUN: 6 mg/dL (ref 6–20)
CO2: 23 mmol/L (ref 22–32)
Calcium: 8.4 mg/dL — ABNORMAL LOW (ref 8.9–10.3)
Chloride: 109 mmol/L (ref 98–111)
Creatinine, Ser: 0.73 mg/dL (ref 0.44–1.00)
GFR, Estimated: >60 mL/min (ref >60)
Glucose, Bld: 156 mg/dL — ABNORMAL HIGH (ref 70–99)
Potassium: 3.7 mmol/L (ref 3.5–5.1)
Sodium: 138 mmol/L (ref 135–145)

## 2021-06-07 LAB — CBC
HCT: 30.2 % — ABNORMAL LOW (ref 36.0–46.0)
Hemoglobin: 9.7 g/dL — ABNORMAL LOW (ref 12.0–15.0)
MCH: 28 pg (ref 26.0–34.0)
MCHC: 32.1 g/dL (ref 30.0–36.0)
MCV: 87.3 fL (ref 80.0–100.0)
Platelets: 157 10*3/uL (ref 150–400)
RBC: 3.46 MIL/uL — ABNORMAL LOW (ref 3.87–5.11)
RDW: 22.6 % — ABNORMAL HIGH (ref 11.5–15.5)
WBC: 5.7 10*3/uL (ref 4.0–10.5)
nRBC: 0 % (ref 0.0–0.2)

## 2021-06-07 LAB — GLUCOSE, CAPILLARY
Glucose-Capillary: 133 mg/dL — ABNORMAL HIGH (ref 70–99)
Glucose-Capillary: 105 mg/dL — ABNORMAL HIGH (ref 70–99)

## 2021-06-07 LAB — MAGNESIUM: Magnesium: 1.6 mg/dL — ABNORMAL LOW (ref 1.7–2.4)

## 2021-06-07 MED ORDER — MAGNESIUM OXIDE -MG SUPPLEMENT 400 (240 MG) MG PO TABS
800.0000 mg | ORAL_TABLET | Freq: Once | ORAL | Status: AC
  Administered 2021-06-07: 800 mg via ORAL
  Filled 2021-06-07: qty 2

## 2021-06-07 MED ORDER — INSULIN ASPART 100 UNIT/ML IJ SOLN: 0.0000 [IU] | Freq: Three times a day (TID) | INTRAMUSCULAR | 11 refills | Status: DC

## 2021-06-07 MED ORDER — INSULIN STARTER KIT- PEN NEEDLES (ENGLISH): 1.0000 | Freq: Once | 0 refills | Status: DC

## 2021-06-07 MED ORDER — INSULIN GLARGINE 100 UNIT/ML SOLOSTAR PEN: 40.0000 [IU] | PEN_INJECTOR | Freq: Two times a day (BID) | SUBCUTANEOUS | 0 refills | Status: DC

## 2021-06-07 MED ORDER — GLIPIZIDE 10 MG PO TABS
10.0000 mg | ORAL_TABLET | Freq: Every day | ORAL | 0 refills | Status: DC
Start: 2021-06-07 — End: 2021-06-12

## 2021-06-07 MED ORDER — BLOOD GLUCOSE MONITOR KIT: PACK | 0 refills | Status: AC

## 2021-06-07 MED ORDER — LOSARTAN POTASSIUM 50 MG PO TABS: 50.0000 mg | ORAL_TABLET | Freq: Every day | ORAL | 0 refills | Status: DC

## 2021-06-07 MED ORDER — POTASSIUM CHLORIDE CRYS ER 20 MEQ PO TBCR
40.0000 meq | EXTENDED_RELEASE_TABLET | Freq: Once | ORAL | Status: AC
  Administered 2021-06-07: 40 meq via ORAL
  Filled 2021-06-07: qty 2

## 2021-06-07 MED ORDER — METFORMIN HCL 500 MG PO TABS: 500.0000 mg | ORAL_TABLET | Freq: Two times a day (BID) | ORAL | 0 refills | Status: DC

## 2021-06-07 NOTE — Progress Notes (Signed)
Patient discharged via wheelchair, patient gived discharge instructions and stated understanding.

## 2021-06-07 NOTE — Progress Notes (Signed)
Inpatient Diabetes Program Recommendations  AACE/ADA: New Consensus Statement on Inpatient Glycemic Control (2015)  Target Ranges:  Prepandial:   less than 140 mg/dL      Peak postprandial:   less than 180 mg/dL (1-2 hours)      Critically ill patients:  140 - 180 mg/dL   Lab Results  Component Value Date   GLUCAP 133 (H) 06/07/2021   HGBA1C 14.8 (H) 06/01/2021    Review of Glycemic Control Results for MIKE, BERNTSEN (MRN [id number]) as of 06/07/2021 11:38  Ref. Range 06/06/2021 06:29 06/06/2021 12:25 06/06/2021 17:17 06/06/2021 22:08 06/07/2021 05:47  Glucose-Capillary Latest Ref Range: 70 - 99 mg/dL 295 (H) 284 (H) 132 (H) 188 (H) 133 (H)   Diabetes history: New onset DM  Current orders for Inpatient glycemic control:  Glucotrol 10 mg daily Novolog moderate tid with meals and HS Novolog 8 units tid with meals Semglee 60 units bid Inpatient Diabetes Program Recommendations:    Note patient to d/c today.   Assisted patient with getting Lantus co-pay card for medication. Patient downloaded to her phone.  Reviewed medications with patient and discussed administration of insulin.  Gave patient [employer] meter just in case she cannot afford strips on banded meter.  Patient appreciative.  We reviewed normal blood sugar values as well.   Thanks  Beryl Meager, RN, BC-ADM Inpatient Diabetes Coordinator Pager [phone]  (8a-5p)

## 2021-06-07 NOTE — Discharge Summary (Addendum)
Physician Discharge Summary  Vanessa [name] JJO:[id number] DOB: 10/21/1971 DOA: 05/31/2021  PCP: Biagio Borg, MD  Admit date: 05/31/2021 Discharge date: 06/07/2021  Admitted From: Home Disposition: Home  Recommendations for Outpatient Follow-up:  Follow up with PCP in 1-2 weeks Please obtain BMP/CBC in one week your next doctors visit.  Advised to keep log of blood glucose at home 4 times daily.  3 times before meal and 1 tab before bedtime. Will need to follow-up outpatient endocrinology Discharge diabetic regimen-Lantus 40 units twice daily, metformin 500 mg twice daily, glipizide 10 mg daily Glucometer has been given.  Diabetic education provided Repeat thyroid studies in 4 weeks Synthroid 200 mcg orally daily  Discharge Condition: Stable CODE STATUS: Full code Diet recommendation: Diabetes  Brief/Interim Summary: 50 year old with past medical history significant for lumbar spinal stenosis, morbid obesity, hypothyroidism, OSA, noncompliant with CPAP, prediabetic who presented to the ED due to altered mental status.  Last known well was on Wednesday afternoon.  Patient has been vomiting intermittently for the last 2 weeks, associated with abdominal pain. Evaluation in the ED patient was found to be in DKA, CBG 621, bicarb 9, anion gap of 33, beta hydroxybutyrate greater than 8 pH 7.2.  Patient has been transition to long acting insulin. CBG has been persistently in the 300., We are adjusting insulin regime. Home when CBG 200 range.  With adjustment in insulin regimen, blood glucose levels improved.  Diabetic regimen as mentioned above.  Medically stable for discharge today.   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  XR negative   Acute metabolic encephalopathy -Combination of DKA, acidosis uncontrolled hypothyroidism.  Improved   Severe hypothyroidism -TSH greater than 60, T4 not checked - On Synthroid 223mg po daily.  - Will need repeat thyroid studies in 4 weeks   Urinary tract infection -Completed 3 days of [place] on 8/3   Blurry vision -CTA shows low-lying cerebellar tonsil.  Will need outpatient ophthalmology eval.  Previous provider spoke with neurology.   Acute kidney injury, resolved -Baseline creatinine 1.0, peaked at 2.3   Acute pancreatitis, uncomplicated -Elevated lipase.  CT abdomen pelvis showed some inflammation of pancreas.  Conservative management   Essential hypertension  - Resume home regimen   Depression anxiety  Resume Celexa 276m . Resume gabapentin 8/4 if tolerates celexa.      Hepatic steatosis by CT scan: She will need MRI once stable as an outpatient   Estimated body mass index is 55.87 kg/m as calculated from the following:   Height as of this encounter: _0  (1.651 m).   Weight as of this encounter: 152.3 kg.   She needs outpatient Endocrinology referral.    Body mass index is 55.87 kg/m.         Discharge Diagnoses:  Active Problems:   [facility]     Consultations: Diabetic coordinator  Subjective: Feels great no complaints.  Blood glucose is better controlled  Discharge Exam: Vitals:   06/07/21 0809 06/07/21 0827  BP:  118/77  Pulse: 68 62  Resp: 16 18  Temp:  97.9 F (36.6 C)  SpO2:  97%   Vitals:  06/06/21 2331 06/07/21 0336 06/07/21 0809 06/07/21 0827  BP: 135/78 139/78  118/77  Pulse: 67 65 68 62  Resp: _0 Temp: 98 F (36.7 C) 97.7 F (36.5 C)  97.9 F (36.6 C)  TempSrc: Oral Oral  Oral  SpO2: 98% 95%  97%  Weight:      Height:        General: Pt is alert, awake, not in acute distress Cardiovascular: RRR, S1/S2 +, no rubs, no  gallops Respiratory: CTA bilaterally, no wheezing, no rhonchi Abdominal: Soft, NT, ND, bowel sounds + Extremities: no edema, no cyanosis  Discharge Instructions  Discharge Instructions     Ambulatory referral to Endocrinology   Complete by: As directed    New diagnosis of diabetes mellitus type 2, insulin-dependent.  Uncontrolled hypothyroidism.      Allergies as of 06/07/2021       Reactions   Ibuprofen Other (See Comments)   CAUSES BLEEDING   Influenza Vaccines Shortness Of Breath        Medication List     STOP taking these medications    doxycycline 100 MG tablet Commonly known as: VIBRA-TABS   guaiFENesin 600 MG 12 hr tablet Commonly known as: Mucinex   naproxen 500 MG tablet Commonly known as: NAPROSYN   predniSONE 10 MG tablet Commonly known as: DELTASONE       TAKE these medications    acetaminophen-codeine 300-60 MG tablet Commonly known as: TYLENOL #4 Take 1 tablet by mouth every 4 (four) hours as needed for moderate pain.   albuterol 108 (90 Base) MCG/ACT inhaler Commonly known as: VENTOLIN HFA Inhale 2 puffs into the lungs every 6 (six) hours as needed for wheezing or shortness of breath.   ALPRAZolam 0.5 MG tablet Commonly known as: XANAX Take 1 tablet (0.5 mg total) by mouth 2 (two) times daily as needed for anxiety.   blood glucose meter kit and supplies Kit Dispense based on patient and insurance preference. Use up to four times daily as directed.   celecoxib 200 MG capsule Commonly known as: CeleBREX Take 1 capsule (200 mg total) by mouth 2 (two) times daily after a meal.   cetirizine 10 MG tablet Commonly known as: ZYRTEC Take 1 tablet (10 mg total) by mouth daily.   cholecalciferol 1000 units tablet Commonly known as: VITAMIN D Take 1,000 Units by mouth daily.   citalopram 20 MG tablet Commonly known as: CeleXA Take 1 tablet (20 mg total) by mouth daily.   diclofenac 50 MG EC tablet Commonly known as: VOLTAREN Take 1  tablet (50 mg total) by mouth 3 (three) times daily.   gabapentin 300 MG capsule Commonly known as: NEURONTIN Take 1 capsule (300 mg total) by mouth 2 (two) times daily.   glipiZIDE 10 MG tablet Commonly known as: GLUCOTROL Take 1 tablet (10 mg total) by mouth daily before breakfast.   hydrochlorothiazide 25 MG tablet Commonly known as: HYDRODIURIL Take 1 tablet (25 mg total) by mouth daily.   HYDROcodone-homatropine 5-1.5 MG/5ML syrup Commonly known as: HYCODAN Take 5 mLs by mouth every 8 (eight) hours as needed for cough.   insulin glargine 100 UNIT/ML Solostar Pen Commonly known as: LANTUS Inject 40 Units into the skin 2 (two) times daily.   insulin starter kit- pen needles Misc 1 kit by Other route once for 1 dose.   lidocaine 5 % Commonly known as: LIDODERM Place 1 patch onto the skin daily as needed (for pain). Remove & Discard patch  within 12 hours or as directed by MD   losartan 50 MG tablet Commonly known as: COZAAR Take 1 tablet (50 mg total) by mouth daily. What changed:  medication strength how much to take   metFORMIN 500 MG tablet Commonly known as: Glucophage Take 1 tablet (500 mg total) by mouth 2 (two) times daily with a meal.   triamcinolone 55 MCG/ACT Aero nasal inhaler Commonly known as: NASACORT Place 2 sprays into the nose daily.       ASK your doctor about these medications    cyclobenzaprine 10 MG tablet Commonly known as: FLEXERIL TAKE 1 TABLET BY MOUTH 3 TIMES DAILY AS NEEDED FOR MUSCLE SPASMS.   fluticasone 0.05 % cream Commonly known as: CUTIVATE APPLY TO SKIN TWICE DAILY   levothyroxine 200 MCG tablet Commonly known as: SYNTHROID TAKE 1 TABLET BY MOUTH DAILY BEFORE BREAKFAST   montelukast 10 MG tablet Commonly known as: SINGULAIR Take 1 tablet (10 mg total) by mouth daily.   Qvar 80 MCG/ACT inhaler Generic drug: beclomethasone INHALE 1 PUFF INTO THE LUNGS Daily   SUMAtriptan 100 MG tablet Commonly known as:  IMITREX TAKE 1 TABLET BY MOUTH AS NEEDED FOR MIGRAINE OR HEADACHE.* MAY REPEAT IN 2 HOURS IF NEEDED   traMADol 50 MG tablet Commonly known as: ULTRAM Take 1-2 tablets (50-100 mg total) by mouth every 6 (six) hours as needed.               Durable Medical Equipment  (From admission, onward)           Start     Ordered   06/06/21 1910  For home use only DME Bedside commode  Once       Comments: Pt needs bariatric size  Question:  Patient needs a bedside commode to treat with the following condition  Answer:  Weakness   06/06/21 1910   06/05/21 1444  For home use only DME Walker rolling  Once       Comments: Bariatric  Question Answer Comment  Walker: With [place]   Patient needs a walker to treat with the following condition General weakness      06/05/21 1443            Follow-up [place], [facility] Follow up.   Why: Bariatric Rolling walker ordered via [employer]- will be delivered to the room prior to transition home. Contact information: [address] [place] [address] [phone]                Allergies  Allergen Reactions   Ibuprofen Other (See Comments)    CAUSES BLEEDING   Influenza Vaccines Shortness Of Breath    You were cared for by a hospitalist during your hospital stay. If you have any questions about your discharge medications or the care you received while you were in the hospital after you are discharged, you can call the unit and asked to speak with the hospitalist on call if the hospitalist that took care of you is not available. Once you are discharged, your primary care physician will handle any further medical issues. Please note that no refills for any discharge medications will be authorized once you are discharged, as it is imperative that you return to your primary care physician (or establish a relationship with a primary care physician if you do not have one) for your aftercare needs so that they can  reassess your need for medications and monitor your lab  values.   Procedures/Studies: CT ABDOMEN PELVIS WO CONTRAST  Result Date: 05/31/2021 CLINICAL DATA:  Nausea and vomiting.  Epigastric pain. EXAM: CT ABDOMEN AND PELVIS WITHOUT CONTRAST TECHNIQUE: Multidetector CT imaging of the abdomen and pelvis was performed following the standard protocol without IV contrast. COMPARISON:  None. FINDINGS: Lower chest: No focal airspace disease or pleural effusion, mild motion artifact limitations. Heart is normal in size. Hepatobiliary: Diffusely decreased hepatic density consistent with steatosis. Geographic area of decreased density in the central liver abutting the falciform ligament is favored to be geographic steatosis but technically indeterminate. Gallbladder is difficult to delineate due to similar density to the adjacent liver, possible intraluminal sludge. No calcified gallstone. No biliary dilatation. Pancreas: Suggestion of mild edema adjacent to the pancreatic head, for example series 4, image 44. No pancreatic ductal dilatation. No peripancreatic collection. Spleen: Normal in size without focal abnormality. Adrenals/Urinary Tract: Low-density left adrenal nodule consistent with adenoma. Normal right adrenal gland. No hydronephrosis. No visualized renal calculi. Motion artifact through the lower right kidney. No evidence of focal renal lesion. Unremarkable urinary bladder. Stomach/Bowel: Small hiatal hernia. The stomach is decompressed. No small bowel obstruction. No small bowel inflammation. Appendix is not definitively identified, no appendicitis. Small volume of colonic stool. Occasional left colonic diverticula without diverticulitis. No colonic wall thickening or inflammatory change. Vascular/Lymphatic: Normal caliber abdominal aorta. No portal venous or mesenteric gas. No abdominopelvic adenopathy. Reproductive: Uterus and bilateral adnexa are unremarkable. Other: No free air, free fluid, or  intra-abdominal fluid collection. Small fat containing umbilical hernia. Musculoskeletal: Degenerative change in the spine and both sacroiliac joints. There are no acute or suspicious osseous abnormalities. IMPRESSION: 1. Suggestion of mild edema adjacent to the pancreatic head, suspicious for acute pancreatitis. Recommend correlation with pancreatic enzymes. 2. Hepatic steatosis. Geographic area of decreased density in the central liver abutting the falciform ligament is favored to be geographic steatosis but technically indeterminate. Recommend nonemergent hepatic protocol MRI for characterization on an elective basis. 3. Small hiatal hernia. Small fat containing umbilical hernia. 4. Left adrenal adenoma. Electronically Signed   By: Keith Rake M.D.   On: 05/31/2021 22:42   CT Head Wo Contrast  Result Date: 05/31/2021 CLINICAL DATA:  Altered mental status EXAM: CT HEAD WITHOUT CONTRAST TECHNIQUE: Contiguous axial images were obtained from the base of the skull through the vertex without intravenous contrast. COMPARISON:  08/04/2014 FINDINGS: Brain: The cerebellar tonsils are low lying, similar to prior examination. No abnormal intra or extra-axial mass lesion or fluid collection. No abnormal mass effect or midline shift. No evidence of acute intracranial hemorrhage or infarct. Ventricular size is normal. Cerebellum unremarkable. Vascular: Unremarkable Skull: Intact Sinuses/Orbits: Paranasal sinuses are clear. Orbits are unremarkable. Other: Mastoid air cells and middle ear cavities are clear. IMPRESSION: No acute intracranial abnormality. Low lying cerebellar tonsils, unchanged from prior examination. Electronically Signed   By: Fidela Salisbury MD   On: 05/31/2021 22:40   DG CHEST PORT 1 VIEW  Result Date: 06/01/2021 CLINICAL DATA:  Altered mental status since 07/27. EXAM: PORTABLE CHEST 1 VIEW COMPARISON:  08/04/2014 FINDINGS: Midline trachea. Mild cardiomegaly. Mediastinal contours otherwise within  normal limits. No pleural effusion or pneumothorax. Clear lungs. No congestive failure. IMPRESSION: Cardiomegaly without congestive failure. Electronically Signed   By: Abigail Miyamoto M.D.   On: 06/01/2021 11:54   DG Ankle Left Port  Result Date: 06/05/2021 CLINICAL DATA:  general left ankle pain and swelling x 1-2 weeks. Denies injury to ankle. Pt states she has a hx of  a hairline fracture in same ankle in 2008 and had to wear a boot on her left foot for treatment EXAM: PORTABLE LEFT ANKLE - 2 VIEW COMPARISON:  None. FINDINGS: No cortical erosion or destruction. There is no evidence of fracture or dislocation. Old healed distal fibular fracture versus degenerative changes. There is no evidence of arthropathy or other focal bone abnormality. Diffuse subcutaneus soft tissue edema. IMPRESSION: Negative for acute traumatic injury. Electronically Signed   By: Iven Finn M.D.   On: 06/05/2021 15:17     The results of significant diagnostics from this hospitalization (including imaging, microbiology, ancillary and laboratory) are listed below for reference.     Microbiology: Recent Results (from the past 240 hour(s))  Resp Panel by RT-PCR (Flu A&B, Covid) Nasopharyngeal Swab     Status: None   Collection Time: 05/31/21  5:34 PM   Specimen: Nasopharyngeal Swab; Nasopharyngeal(NP) swabs in vial transport medium  Result Value Ref Range Status   SARS Coronavirus 2 by RT PCR NEGATIVE NEGATIVE Final    Comment: (NOTE) SARS-CoV-2 target nucleic acids are NOT DETECTED.  The SARS-CoV-2 RNA is generally detectable in upper respiratory specimens during the acute phase of infection. The lowest concentration of SARS-CoV-2 viral copies this assay can detect is 138 copies/mL. A negative result does not preclude SARS-Cov-2 infection and should not be used as the sole basis for treatment or other patient management decisions. A negative result may occur with  improper specimen collection/handling, submission of  specimen other than nasopharyngeal swab, presence of viral mutation(s) within the areas targeted by this assay, and inadequate number of viral copies(<138 copies/mL). A negative result must be combined with clinical observations, patient history, and epidemiological information. The expected result is Negative.  Fact Sheet for Patients:  EntrepreneurPulse.com.au  Fact Sheet for Healthcare Providers:  IncredibleEmployment.be  This test is no t yet approved or cleared by the [place] FDA and  has been authorized for detection and/or diagnosis of SARS-CoV-2 by FDA under an Emergency Use Authorization (EUA). This EUA will remain  in effect (meaning this test can be used) for the duration of the COVID-19 declaration under Section 564(b)(1) of the Act, 21 U.S.C.section 360bbb-3(b)(1), unless the authorization is terminated  or revoked sooner.       Influenza A by PCR NEGATIVE NEGATIVE Final   Influenza B by PCR NEGATIVE NEGATIVE Final    Comment: (NOTE) The Xpert Xpress SARS-CoV-2/FLU/RSV plus assay is intended as an aid in the diagnosis of influenza from Nasopharyngeal swab specimens and should not be used as a sole basis for treatment. Nasal washings and aspirates are unacceptable for Xpert Xpress SARS-CoV-2/FLU/RSV testing.  Fact Sheet for Patients: EntrepreneurPulse.com.au  Fact Sheet for Healthcare Providers: IncredibleEmployment.be  This test is not yet approved or cleared by the [place] FDA and has been authorized for detection and/or diagnosis of SARS-CoV-2 by FDA under an Emergency Use Authorization (EUA). This EUA will remain in effect (meaning this test can be used) for the duration of the COVID-19 declaration under Section 564(b)(1) of the Act, 21 U.S.C. section 360bbb-3(b)(1), unless the authorization is terminated or revoked.  Performed at [facility], [place] [address]  [place], [place] [address]   Urine Culture     Status: Abnormal   Collection Time: 06/01/21  1:51 PM   Specimen: Urine, Clean Catch  Result Value Ref Range Status   Specimen Description URINE, CLEAN CATCH  Final   Special Requests NONE  Final   Culture (A)  Final    <10,000 COLONIES/mL INSIGNIFICANT GROWTH Performed at [facility] [address], [place], [place] [address]    Report Status 06/02/2021 FINAL  Final     Labs: BNP (last 3 results) No results for input(s): BNP in the last 8760 hours. Basic Metabolic Panel: Recent Labs  Lab 06/01/21 0546 06/01/21 0806 06/03/21 0139 06/04/21 0240 06/05/21 0100 06/06/21 0220 06/07/21 0107  NA 142   < > 136 137 135 134* 138  K 4.2   < > 3.8 3.7 3.7 4.1 3.7  CL 106   < > 103 103 103 103 109  CO2 21*   < > _0 GLUCOSE 234*   < > 368* 343* 362* 319* 156*  BUN 29*   < > _1 CREATININE 1.50*   < > 1.24* 1.20* 1.02* 0.82 0.73  CALCIUM 8.9   < > 8.5* 8.8* 8.4* 8.4* 8.4*  MG 2.5*  --   --   --   --  1.6* 1.6*  PHOS 2.6  --   --   --   --   --   --    < > = values in this interval not displayed.   Liver Function Tests: Recent Labs  Lab 05/31/21 1706 06/01/21 0546  AST 24 23  ALT 33 28  ALKPHOS 97 83  BILITOT 3.0* 2.0*  PROT 9.0* 7.8  ALBUMIN 3.9 3.3*   Recent Labs  Lab 05/31/21 1706 06/01/21 0947  LIPASE 100* 70*   Recent Labs  Lab 06/01/21 0947  AMMONIA 39*   CBC: Recent Labs  Lab 05/31/21 1706 05/31/21 1725 06/01/21 0546 06/02/21 0108 06/04/21 0240 06/06/21 0220 06/07/21 0107  WBC 12.1*  --  10.0 7.4 5.9 6.8 5.7  NEUTROABS 8.5*  --   --   --   --   --   --   HGB 14.3   < > 12.4 12.2 10.6* 9.6* 9.7*  HCT 45.3   < > 39.3 39.2 32.8* 29.9* 30.2*  MCV 87.1  --  85.1 86.5 87.0 87.4 87.3  PLT 271  --  216 200 140* 157 157   < > = values in this interval not displayed.   Cardiac Enzymes: No results for input(s): CKTOTAL, CKMB, CKMBINDEX, TROPONINI in the last 168  hours. BNP: Invalid input(s): POCBNP CBG: Recent Labs  Lab 06/06/21 1225 06/06/21 1717 06/06/21 2208 06/07/21 0547 06/07/21 1138  GLUCAP 194* 212* 188* 133* 105*   D-Dimer No results for input(s): DDIMER in the last 72 hours. Hgb A1c No results for input(s): HGBA1C in the last 72 hours. Lipid Profile No results for input(s): CHOL, HDL, LDLCALC, TRIG, CHOLHDL, LDLDIRECT in the last 72 hours. Thyroid function studies Recent Labs    06/05/21 1013  TSH 65.496*   Anemia work up No results for input(s): VITAMINB12, FOLATE, FERRITIN, TIBC, IRON, RETICCTPCT in the last 72 hours. Urinalysis    Component Value Date/Time   COLORURINE YELLOW 05/31/2021 1806   APPEARANCEUR HAZY (A) 05/31/2021 1806   LABSPEC 1.023 05/31/2021 1806   PHURINE 6.0 05/31/2021 1806   GLUCOSEU >=500 (A) 05/31/2021 1806   GLUCOSEU NEGATIVE 05/16/2019 1509   HGBUR MODERATE (A) 05/31/2021 1806   BILIRUBINUR NEGATIVE 05/31/2021 1806   KETONESUR 80 (A) 05/31/2021 1806   PROTEINUR 100 (A) 05/31/2021 1806   UROBILINOGEN 0.2 05/16/2019 1509   NITRITE NEGATIVE 05/31/2021 1806   LEUKOCYTESUR NEGATIVE 05/31/2021 1806   Sepsis Labs  Invalid input(s): PROCALCITONIN,  WBC,  LACTICIDVEN Microbiology Recent Results (from the past 240 hour(s))  Resp Panel by RT-PCR (Flu A&B, Covid) Nasopharyngeal Swab     Status: None   Collection Time: 05/31/21  5:34 PM   Specimen: Nasopharyngeal Swab; Nasopharyngeal(NP) swabs in vial transport medium  Result Value Ref Range Status   SARS Coronavirus 2 by RT PCR NEGATIVE NEGATIVE Final    Comment: (NOTE) SARS-CoV-2 target nucleic acids are NOT DETECTED.  The SARS-CoV-2 RNA is generally detectable in upper respiratory specimens during the acute phase of infection. The lowest concentration of SARS-CoV-2 viral copies this assay can detect is 138 copies/mL. A negative result does not preclude SARS-Cov-2 infection and should not be used as the sole basis for treatment or other  patient management decisions. A negative result may occur with  improper specimen collection/handling, submission of specimen other than nasopharyngeal swab, presence of viral mutation(s) within the areas targeted by this assay, and inadequate number of viral copies(<138 copies/mL). A negative result must be combined with clinical observations, patient history, and epidemiological information. The expected result is Negative.  Fact Sheet for Patients:  EntrepreneurPulse.com.au  Fact Sheet for Healthcare Providers:  IncredibleEmployment.be  This test is no t yet approved or cleared by the [place] FDA and  has been authorized for detection and/or diagnosis of SARS-CoV-2 by FDA under an Emergency Use Authorization (EUA). This EUA will remain  in effect (meaning this test can be used) for the duration of the COVID-19 declaration under Section 564(b)(1) of the Act, 21 U.S.C.section 360bbb-3(b)(1), unless the authorization is terminated  or revoked sooner.       Influenza A by PCR NEGATIVE NEGATIVE Final   Influenza B by PCR NEGATIVE NEGATIVE Final    Comment: (NOTE) The Xpert Xpress SARS-CoV-2/FLU/RSV plus assay is intended as an aid in the diagnosis of influenza from Nasopharyngeal swab specimens and should not be used as a sole basis for treatment. Nasal washings and aspirates are unacceptable for Xpert Xpress SARS-CoV-2/FLU/RSV testing.  Fact Sheet for Patients: EntrepreneurPulse.com.au  Fact Sheet for Healthcare Providers: IncredibleEmployment.be  This test is not yet approved or cleared by the [place] FDA and has been authorized for detection and/or diagnosis of SARS-CoV-2 by FDA under an Emergency Use Authorization (EUA). This EUA will remain in effect (meaning this test can be used) for the duration of the COVID-19 declaration under Section 564(b)(1) of the Act, 21 U.S.C. section  360bbb-3(b)(1), unless the authorization is terminated or revoked.  Performed at [facility], [place] [address] [place], [place] [address]   Urine Culture     Status: Abnormal   Collection Time: 06/01/21  1:51 PM   Specimen: Urine, Clean Catch  Result Value Ref Range Status   Specimen Description URINE, CLEAN CATCH  Final   Special Requests NONE  Final   Culture (A)  Final    <10,000 COLONIES/mL INSIGNIFICANT GROWTH Performed at [facility], [place] [address], [place], [place] [address]    Report Status 06/02/2021 FINAL  Final     Time coordinating discharge:  I have spent 35 minutes face to face with the patient and on the ward discussing the patients care, assessment, plan and disposition with other care givers. >50% of the time was devoted counseling the patient about the risks and benefits of treatment/Discharge disposition and coordinating care.   SIGNED:   Damita Lack, MD  [facility] 06/07/2021, 12:22 PM   If 7PM-7AM, please contact night-coverage

## 2021-06-07 NOTE — Plan of Care (Signed)
  Problem: Education: Goal: Knowledge of General Education information will improve Description: Including pain rating scale, medication(s)/side effects and non-pharmacologic comfort measures Outcome: Adequate for Discharge   Problem: Health Behavior/Discharge Planning: Goal: Ability to manage health-related needs will improve Outcome: Adequate for Discharge   Problem: Clinical Measurements: Goal: Ability to maintain clinical measurements within normal limits will improve Outcome: Adequate for Discharge Goal: Will remain free from infection Outcome: Adequate for Discharge Goal: Diagnostic test results will improve Outcome: Adequate for Discharge Goal: Respiratory complications will improve Outcome: Adequate for Discharge Goal: Cardiovascular complication will be avoided Outcome: Adequate for Discharge   Problem: Activity: Goal: Risk for activity intolerance will decrease Outcome: Adequate for Discharge   Problem: Nutrition: Goal: Adequate nutrition will be maintained Outcome: Adequate for Discharge   Problem: Coping: Goal: Level of anxiety will decrease Outcome: Adequate for Discharge   Problem: Elimination: Goal: Will not experience complications related to bowel motility Outcome: Adequate for Discharge Goal: Will not experience complications related to urinary retention Outcome: Adequate for Discharge   Problem: Pain Managment: Goal: General experience of comfort will improve Outcome: Adequate for Discharge   Problem: Safety: Goal: Ability to remain free from injury will improve Outcome: Adequate for Discharge   Problem: Skin Integrity: Goal: Risk for impaired skin integrity will decrease Outcome: Adequate for Discharge   Problem: Acute Rehab OT Goals (only OT should resolve) Goal: Pt. Will Perform Lower Body Bathing Outcome: Adequate for Discharge Goal: Pt. Will Transfer To Toilet Outcome: Adequate for Discharge Goal: Pt. Will Perform Tub/Shower  Transfer Outcome: Adequate for Discharge   Problem: Food- and Nutrition-Related Knowledge Deficit (NB-1.1) Goal: Nutrition education Description: Formal process to instruct or train a patient/client in a skill or to impart knowledge to help patients/clients voluntarily manage or modify food choices and eating behavior to maintain or improve health. Outcome: Adequate for Discharge   Problem: Acute Rehab PT Goals(only PT should resolve) Goal: Patient Will Transfer Sit To/From Stand Outcome: Adequate for Discharge Goal: Pt Will Ambulate Outcome: Adequate for Discharge Goal: Pt/caregiver will Perform Home Exercise Program Outcome: Adequate for Discharge

## 2021-06-12 ENCOUNTER — Other Ambulatory Visit: Payer: Self-pay

## 2021-06-12 ENCOUNTER — Ambulatory Visit (INDEPENDENT_AMBULATORY_CARE_PROVIDER_SITE_OTHER): Payer: 59 | Admitting: Internal Medicine

## 2021-06-12 ENCOUNTER — Encounter: Payer: Self-pay | Admitting: Internal Medicine

## 2021-06-12 VITALS — BP 142/80 | HR 77 | Temp 98.2°F | Ht 65.0 in | Wt 365.0 lb

## 2021-06-12 DIAGNOSIS — F419 Anxiety disorder, unspecified: Secondary | ICD-10-CM | POA: Diagnosis not present

## 2021-06-12 DIAGNOSIS — K859 Acute pancreatitis without necrosis or infection, unspecified: Secondary | ICD-10-CM

## 2021-06-12 DIAGNOSIS — H538 Other visual disturbances: Secondary | ICD-10-CM | POA: Diagnosis not present

## 2021-06-12 DIAGNOSIS — I10 Essential (primary) hypertension: Secondary | ICD-10-CM

## 2021-06-12 DIAGNOSIS — E039 Hypothyroidism, unspecified: Secondary | ICD-10-CM | POA: Diagnosis not present

## 2021-06-12 DIAGNOSIS — N911 Secondary amenorrhea: Secondary | ICD-10-CM | POA: Insufficient documentation

## 2021-06-12 DIAGNOSIS — E1165 Type 2 diabetes mellitus with hyperglycemia: Secondary | ICD-10-CM

## 2021-06-12 DIAGNOSIS — R9389 Abnormal findings on diagnostic imaging of other specified body structures: Secondary | ICD-10-CM | POA: Insufficient documentation

## 2021-06-12 DIAGNOSIS — E1110 Type 2 diabetes mellitus with ketoacidosis without coma: Secondary | ICD-10-CM | POA: Diagnosis not present

## 2021-06-12 DIAGNOSIS — Z6841 Body Mass Index (BMI) 40.0 and over, adult: Secondary | ICD-10-CM | POA: Insufficient documentation

## 2021-06-12 MED ORDER — HYDROCHLOROTHIAZIDE 25 MG PO TABS: 25.0000 mg | ORAL_TABLET | Freq: Every day | ORAL | 3 refills | Status: DC

## 2021-06-12 MED ORDER — INSULIN GLARGINE 100 UNIT/ML SOLOSTAR PEN: 40.0000 [IU] | PEN_INJECTOR | Freq: Two times a day (BID) | SUBCUTANEOUS | 5 refills | Status: DC

## 2021-06-12 MED ORDER — CLONAZEPAM 0.5 MG PO TABS: 0.5000 mg | ORAL_TABLET | Freq: Two times a day (BID) | ORAL | 1 refills | Status: DC | PRN

## 2021-06-12 MED ORDER — GLIPIZIDE 10 MG PO TABS: 10.0000 mg | ORAL_TABLET | Freq: Every day | ORAL | 3 refills | Status: DC

## 2021-06-12 MED ORDER — CELECOXIB 200 MG PO CAPS: 200.0000 mg | ORAL_CAPSULE | Freq: Two times a day (BID) | ORAL | 3 refills | Status: DC

## 2021-06-12 MED ORDER — ALBUTEROL SULFATE HFA 108 (90 BASE) MCG/ACT IN AERS: 2.0000 | INHALATION_SPRAY | Freq: Four times a day (QID) | RESPIRATORY_TRACT | 11 refills | Status: DC | PRN

## 2021-06-12 MED ORDER — SUMATRIPTAN SUCCINATE 100 MG PO TABS: ORAL_TABLET | ORAL | 11 refills | Status: DC

## 2021-06-12 MED ORDER — LOSARTAN POTASSIUM 50 MG PO TABS: 50.0000 mg | ORAL_TABLET | Freq: Every day | ORAL | 3 refills | Status: DC

## 2021-06-12 MED ORDER — CYCLOBENZAPRINE HCL 10 MG PO TABS: ORAL_TABLET | ORAL | 3 refills | Status: DC

## 2021-06-12 MED ORDER — LEVOTHYROXINE SODIUM 200 MCG PO TABS: ORAL_TABLET | ORAL | 3 refills | Status: DC

## 2021-06-12 MED ORDER — CITALOPRAM HYDROBROMIDE 20 MG PO TABS: 20.0000 mg | ORAL_TABLET | Freq: Every day | ORAL | 3 refills | Status: DC

## 2021-06-12 MED ORDER — DICLOFENAC SODIUM 50 MG PO TBEC: 50.0000 mg | DELAYED_RELEASE_TABLET | Freq: Two times a day (BID) | ORAL | 5 refills | Status: DC | PRN

## 2021-06-12 MED ORDER — METFORMIN HCL 500 MG PO TABS: 500.0000 mg | ORAL_TABLET | Freq: Two times a day (BID) | ORAL | 3 refills | Status: DC

## 2021-06-12 NOTE — Patient Instructions (Addendum)
Please take all new medication as prescribed - the clonazepam as needed for anxiety  Please continue all other medications as before, and refills have been done if requested.  Please have the pharmacy call with any other refills you may need.  Please continue your efforts at being more active, low cholesterol diet, and weight control.  You are otherwise up to date with prevention measures today.  Please keep your appointments with your specialists as you may have planned  You will be contacted regarding the referral for: eye doctor and endocrinology  Please make an Appointment to return in 3 months
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
## 2021-06-15 ENCOUNTER — Encounter: Payer: Self-pay | Admitting: Internal Medicine

## 2021-06-15 DIAGNOSIS — H538 Other visual disturbances: Secondary | ICD-10-CM | POA: Insufficient documentation

## 2021-06-15 DIAGNOSIS — K859 Acute pancreatitis without necrosis or infection, unspecified: Secondary | ICD-10-CM | POA: Insufficient documentation

## 2021-06-15 NOTE — Assessment & Plan Note (Signed)
Likely related to uncontrolled dm - for optho referral as well

## 2021-06-15 NOTE — Assessment & Plan Note (Signed)
Mild to mod, resolved, etiology unclear, cont to follow

## 2021-06-15 NOTE — Assessment & Plan Note (Signed)
Lab Results  Component Value Date   TSH 65.496 (H) 06/05/2021   Stable, pt to continue levothyroxine as restarted, will need f/u tft's at 4 wks

## 2021-06-15 NOTE — Assessment & Plan Note (Signed)
Ok for clonazepam refill,  to f/u any worsening symptoms or concerns

## 2021-06-15 NOTE — Assessment & Plan Note (Signed)
BP Readings from Last 3 Encounters:  06/12/21 (!) 142/80  06/07/21 135/78  04/02/21 (!) 162/84   Mild uncontrolled today, pt to continue medical treatment losartan, hct as pt declines change today

## 2021-06-15 NOTE — Assessment & Plan Note (Signed)
Now improved post hopsn, no s/s of infection, good compliance with meds,  Lab Results  Component Value Date   HGBA1C 14.8 (H) 06/01/2021  cont current tx, refer endo

## 2021-06-25 ENCOUNTER — Telehealth: Payer: Self-pay | Admitting: Internal Medicine

## 2021-06-25 NOTE — Telephone Encounter (Signed)
Pt is having a migraine. She is wondering since becoming diabetic if she can she take acetaminophen-codeine (TYLENOL #4)  for pain relief?   Please advise.

## 2021-06-25 NOTE — Telephone Encounter (Signed)
If she has some on hand, it would not hurt to take this for the current HA, but it is not a good medication for ongoing treatment in the future; we can try other medication such as maxalt if she likes

## 2021-06-28 NOTE — Telephone Encounter (Signed)
Ok, but I have not prescribed this for her in the past I think and it is a narcotic controlled substance, and I would not feel comfortable prescribing this even small amounts for the long term given the risk involved.

## 2021-06-28 NOTE — Telephone Encounter (Signed)
Patient calling in about migraines  Says she doesn't want to continue trying different meds to cope w/ her migraines  Says the acetaminophen-codeine (TYLENOL #4)  really works for her & she prefers to keep taking this  Please follow up w/ patient [phone]

## 2021-07-01 NOTE — Telephone Encounter (Signed)
Advised patient as seen below by provider. Patient notified that refill for Sumitriptan was sent to pharmacy 06/12/21 and that is for migraines. Patient verbalizes understanding.

## 2021-07-10 ENCOUNTER — Ambulatory Visit: Payer: 59 | Admitting: Orthopaedic Surgery

## 2021-07-25 ENCOUNTER — Other Ambulatory Visit: Payer: Self-pay | Admitting: Internal Medicine

## 2021-07-25 MED ORDER — RELION ULTRA THIN LANCETS 30G MISC: 11 refills | Status: AC

## 2021-07-25 MED ORDER — INSULIN PEN NEEDLE 31G X 5 MM MISC
11 refills | Status: DC
Start: 2021-07-25 — End: 2021-08-23

## 2021-07-25 MED ORDER — RELION PREMIER TEST VI STRP: ORAL_STRIP | 12 refills | Status: AC

## 2021-07-25 NOTE — Telephone Encounter (Signed)
Done erx 

## 2021-07-25 NOTE — Telephone Encounter (Signed)
   Patient requesting refill today. She has no needle to take insulin tonight

## 2021-07-25 NOTE — Telephone Encounter (Signed)
Patient is requesting rx be sent for her diabetic testing supplies.  Patient needs lancets, ReliOn Premier test strips, & also BD UltraFine Mini Pen Needle 40mmx31g  Pharmacy: [facility] [phone] - [place], [place] - [address]  Phone:  [phone] Fax:  [phone]

## 2021-07-29 ENCOUNTER — Ambulatory Visit: Payer: 59 | Admitting: Orthopaedic Surgery

## 2021-08-05 ENCOUNTER — Telehealth: Payer: Self-pay | Admitting: Internal Medicine

## 2021-08-05 NOTE — Telephone Encounter (Signed)
Requesting advise on allergy medicines. Stated she was recently diagnosed with Diabetes. The OTC allergy medicine now raises her blood sugar levels. Also concerned whether her inhalers (  and  ) will raise her sugar level as well.   Requesting a callback at [phone]

## 2021-08-05 NOTE — Telephone Encounter (Signed)
Non the Qvar should be ok as the medicaiton only gets to the lung, and not her whole body

## 2021-08-06 ENCOUNTER — Telehealth: Payer: Self-pay | Admitting: Internal Medicine

## 2021-08-06 NOTE — Telephone Encounter (Signed)
Ok to cotinue the celebrex twice per day as needed but o/w I dont have other pain med that would be appropriate

## 2021-08-06 NOTE — Telephone Encounter (Signed)
Patient would like to know what she can take for pain in her ear other than Tylenol.

## 2021-08-08 NOTE — Telephone Encounter (Signed)
Pt needs OV, or go to UC or ED

## 2021-08-08 NOTE — Telephone Encounter (Addendum)
Please advise as the pt has stated she would like a referral to ENT as she is in pain and the celebrex a/w OTC meds are not helping with her allergies, migraines, sinus pressure and ear pain. Pt is at the point of unable to sleep due to the pain and pressure.  **Pt states she she needs something else for the pain as well cause nothing is working and she is unable to eat.  **Please call pt when refferal is done.

## 2021-08-08 NOTE — Telephone Encounter (Signed)
Tried calling pt to inform her of Dr.John's instructions. Unable to lvm due to pt not having a vm set up at this time.

## 2021-08-08 NOTE — Telephone Encounter (Signed)
Patient has been notified and is frustrated as she states that this has been a discussed ongoing issue since last year. Patient states that she will need to call back to schedule once she has transportation.

## 2021-08-23 ENCOUNTER — Encounter: Payer: Self-pay | Admitting: Endocrinology

## 2021-08-23 ENCOUNTER — Ambulatory Visit (INDEPENDENT_AMBULATORY_CARE_PROVIDER_SITE_OTHER): Payer: 59 | Admitting: Endocrinology

## 2021-08-23 ENCOUNTER — Other Ambulatory Visit: Payer: Self-pay

## 2021-08-23 VITALS — BP 146/70 | HR 84 | Ht 65.0 in | Wt 343.8 lb

## 2021-08-23 DIAGNOSIS — E1110 Type 2 diabetes mellitus with ketoacidosis without coma: Secondary | ICD-10-CM | POA: Diagnosis not present

## 2021-08-23 LAB — POCT GLYCOSYLATED HEMOGLOBIN (HGB A1C): Hemoglobin A1C: 7.2 % — AB (ref 4.0–5.6)

## 2021-08-23 MED ORDER — TOUJEO MAX SOLOSTAR 300 UNIT/ML ~~LOC~~ SOPN: 80.0000 [IU] | PEN_INJECTOR | SUBCUTANEOUS | 3 refills | Status: DC

## 2021-08-23 MED ORDER — FREESTYLE LIBRE 2 SENSOR MISC: 1.0000 | 3 refills | Status: AC
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
## 2021-08-23 NOTE — Patient Instructions (Addendum)
good diet and exercise significantly improve the control of your diabetes.  please let me know if you wish to be referred to a dietician.  high blood sugar is very risky to your health.  you should see an eye doctor and dentist every year.  It is very important to get all recommended vaccinations.  Controlling your blood pressure and cholesterol drastically reduces the damage diabetes does to your body.  Those who smoke should quit.  Please discuss these with your doctor.   check your blood sugar twice a day.  vary the time of day when you check, between before the 3 meals, and at bedtime.  also check if you have symptoms of your blood sugar being too high or too low.  please keep a record of the readings and bring it to your next appointment here (or you can bring the meter itself).  You can write it on any piece of paper.  please call us sooner if your blood sugar goes below 70, or if most of your readings are over 200.   We will need to take this complex situation in stages.   For now, please stop taking the metformin and glipizide, and:  I have sent a prescription to your pharmacy, to change lantus to the concentrated type.  You should take 80 units, all in the morning.   I have sent also prescription to your pharmacy, for the continuous glucose monitor sensors.   Please come back for a follow-up appointment in 1 month.

## 2021-08-26 ENCOUNTER — Telehealth: Payer: Self-pay | Admitting: Endocrinology

## 2021-08-26 NOTE — Telephone Encounter (Signed)
Pt has a whole box of Lantus paid for, would like finish that before starting [place]. Pt contact [phone]

## 2021-09-19 ENCOUNTER — Other Ambulatory Visit: Payer: Self-pay

## 2021-09-19 ENCOUNTER — Ambulatory Visit (INDEPENDENT_AMBULATORY_CARE_PROVIDER_SITE_OTHER): Payer: 59 | Admitting: Endocrinology

## 2021-09-19 VITALS — BP 140/90 | HR 102 | Ht 65.0 in | Wt 344.0 lb

## 2021-09-19 DIAGNOSIS — E1165 Type 2 diabetes mellitus with hyperglycemia: Secondary | ICD-10-CM

## 2021-09-19 MED ORDER — TOUJEO MAX SOLOSTAR 300 UNIT/ML ~~LOC~~ SOPN: 74.0000 [IU] | PEN_INJECTOR | SUBCUTANEOUS | 3 refills | Status: DC
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
## 2021-09-19 NOTE — Patient Instructions (Addendum)
check your blood sugar twice a day.  vary the time of day when you check, between before the 3 meals, and at bedtime.  also check if you have symptoms of your blood sugar being too high or too low.  please keep a record of the readings and bring it to your next appointment here (or you can bring the meter itself).  You can write it on any piece of paper.  please call us sooner if your blood sugar goes below 70, or if most of your readings are over 200.   We will need to take this complex situation in stages.    I have sent a prescription to your pharmacy, to change lantus to the concentrated type.    Please come back for a follow-up appointment in 2 months.

## 2021-09-20 ENCOUNTER — Encounter: Payer: Self-pay | Admitting: Internal Medicine

## 2021-09-20 ENCOUNTER — Ambulatory Visit (INDEPENDENT_AMBULATORY_CARE_PROVIDER_SITE_OTHER): Payer: 59 | Admitting: Internal Medicine

## 2021-09-20 VITALS — BP 138/80 | HR 72 | Temp 98.7°F | Ht 65.0 in | Wt 343.0 lb

## 2021-09-20 DIAGNOSIS — Z0001 Encounter for general adult medical examination with abnormal findings: Secondary | ICD-10-CM

## 2021-09-20 DIAGNOSIS — Z1211 Encounter for screening for malignant neoplasm of colon: Secondary | ICD-10-CM

## 2021-09-20 DIAGNOSIS — R1011 Right upper quadrant pain: Secondary | ICD-10-CM | POA: Diagnosis not present

## 2021-09-20 DIAGNOSIS — G8929 Other chronic pain: Secondary | ICD-10-CM

## 2021-09-20 DIAGNOSIS — E1165 Type 2 diabetes mellitus with hyperglycemia: Secondary | ICD-10-CM | POA: Diagnosis not present

## 2021-09-20 DIAGNOSIS — E559 Vitamin D deficiency, unspecified: Secondary | ICD-10-CM | POA: Diagnosis not present

## 2021-09-20 DIAGNOSIS — M545 Low back pain, unspecified: Secondary | ICD-10-CM

## 2021-09-20 DIAGNOSIS — Z111 Encounter for screening for respiratory tuberculosis: Secondary | ICD-10-CM

## 2021-09-20 DIAGNOSIS — E538 Deficiency of other specified B group vitamins: Secondary | ICD-10-CM | POA: Diagnosis not present

## 2021-09-20 DIAGNOSIS — Z6841 Body Mass Index (BMI) 40.0 and over, adult: Secondary | ICD-10-CM

## 2021-09-20 DIAGNOSIS — J302 Other seasonal allergic rhinitis: Secondary | ICD-10-CM

## 2021-09-20 DIAGNOSIS — J3089 Other allergic rhinitis: Secondary | ICD-10-CM

## 2021-09-20 DIAGNOSIS — E039 Hypothyroidism, unspecified: Secondary | ICD-10-CM

## 2021-09-20 DIAGNOSIS — I10 Essential (primary) hypertension: Secondary | ICD-10-CM

## 2021-09-20 LAB — CBC WITH DIFFERENTIAL/PLATELET
Basophils Absolute: 0.1 10*3/uL (ref 0.0–0.1)
Basophils Relative: 0.5 % (ref 0.0–3.0)
Eosinophils Absolute: 0.1 10*3/uL (ref 0.0–0.7)
Eosinophils Relative: 1.4 % (ref 0.0–5.0)
HCT: 36.2 % (ref 36.0–46.0)
Hemoglobin: 11.6 g/dL — ABNORMAL LOW (ref 12.0–15.0)
Lymphocytes Relative: 31.3 % (ref 12.0–46.0)
Lymphs Abs: 3.1 10*3/uL (ref 0.7–4.0)
MCHC: 31.9 g/dL (ref 30.0–36.0)
MCV: 76.5 fl — ABNORMAL LOW (ref 78.0–100.0)
Monocytes Absolute: 0.7 10*3/uL (ref 0.1–1.0)
Monocytes Relative: 7 % (ref 3.0–12.0)
Neutro Abs: 5.9 10*3/uL (ref 1.4–7.7)
Neutrophils Relative %: 59.8 % (ref 43.0–77.0)
Platelets: 375 10*3/uL (ref 150.0–400.0)
RBC: 4.74 Mil/uL (ref 3.87–5.11)
RDW: 21.9 % — ABNORMAL HIGH (ref 11.5–15.5)
WBC: 9.9 10*3/uL (ref 4.0–10.5)

## 2021-09-20 LAB — URINALYSIS, ROUTINE W REFLEX MICROSCOPIC
Bilirubin Urine: NEGATIVE
Hgb urine dipstick: NEGATIVE
Ketones, ur: NEGATIVE
Leukocytes,Ua: NEGATIVE
Nitrite: NEGATIVE
Specific Gravity, Urine: 1.015 (ref 1.000–1.030)
Total Protein, Urine: NEGATIVE
Urine Glucose: NEGATIVE
Urobilinogen, UA: 0.2 (ref 0.0–1.0)
pH: 6 (ref 5.0–8.0)

## 2021-09-20 LAB — BASIC METABOLIC PANEL
BUN: 12 mg/dL (ref 6–23)
CO2: 31 mEq/L (ref 19–32)
Calcium: 9.5 mg/dL (ref 8.4–10.5)
Chloride: 99 mEq/L (ref 96–112)
Creatinine, Ser: 0.97 mg/dL (ref 0.40–1.20)
GFR: 68.54 mL/min (ref >60.00)
Glucose, Bld: 86 mg/dL (ref 70–99)
Potassium: 3.6 mEq/L (ref 3.5–5.1)
Sodium: 137 mEq/L (ref 135–145)

## 2021-09-20 LAB — HEPATIC FUNCTION PANEL
ALT: 12 U/L (ref 0–35)
AST: 14 U/L (ref 0–37)
Albumin: 3.9 g/dL (ref 3.5–5.2)
Alkaline Phosphatase: 73 U/L (ref 39–117)
Bilirubin, Direct: 0.1 mg/dL (ref 0.0–0.3)
Total Bilirubin: 0.3 mg/dL (ref 0.2–1.2)
Total Protein: 8.5 g/dL — ABNORMAL HIGH (ref 6.0–8.3)

## 2021-09-20 LAB — HEMOGLOBIN A1C: Hgb A1c MFr Bld: 7 % — ABNORMAL HIGH (ref 4.6–6.5)

## 2021-09-20 LAB — TSH: TSH: 26.39 u[IU]/mL — ABNORMAL HIGH (ref 0.35–5.50)

## 2021-09-20 LAB — VITAMIN D 25 HYDROXY (VIT D DEFICIENCY, FRACTURES): VITD: 12.81 ng/mL — ABNORMAL LOW (ref 30.00–100.00)

## 2021-09-20 LAB — VITAMIN B12: Vitamin B-12: 354 pg/mL (ref 211–911)

## 2021-09-20 LAB — LIPID PANEL
Cholesterol: 199 mg/dL (ref 0–200)
HDL: 67.7 mg/dL (ref >39.00)
LDL Cholesterol: 106 mg/dL — ABNORMAL HIGH (ref 0–99)
NonHDL: 131.74
Total CHOL/HDL Ratio: 3
Triglycerides: 130 mg/dL (ref 0.0–149.0)
VLDL: 26 mg/dL (ref 0.0–40.0)

## 2021-09-20 LAB — MICROALBUMIN / CREATININE URINE RATIO
Creatinine,U: 72.7 mg/dL
Microalb Creat Ratio: 1 mg/g (ref 0.0–30.0)
Microalb, Ur: <0.7 mg/dL (ref 0.0–1.9)
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
## 2021-09-20 NOTE — Patient Instructions (Addendum)
You will be contacted regarding the referral for: cologuard (the kit comes to your house)  Your TB skin test was placed today  Please return on Monday Nov 21 to have this read by the Nurse  Remember to take the Mucinex twice per day and Nasacort and antihistamine for the sinuses  Please continue all other medications as before, and refills have been done if requested.  Please have the pharmacy call with any other refills you may need.  Please continue your efforts at being more active, low cholesterol diet, and weight control.  You are otherwise up to date with prevention measures today.  Please keep your appointments with your specialists as you may have planned - Dr Loanne Drilling for sugar  You will be contacted regarding the referral for: Abdomen Ultrasound for the right upper abd pain  Please go to the LAB at the blood drawing area for the tests to be done  You will be contacted by phone if any changes need to be made immediately.  Otherwise, you will receive a letter about your results with an explanation, but please check with MyChart first.  Please remember to sign up for MyChart if you have not done so, as this will be important to you in the future with finding out test results, communicating by private email, and scheduling acute appointments online when needed.

## 2021-09-21 DIAGNOSIS — E1165 Type 2 diabetes mellitus with hyperglycemia: Secondary | ICD-10-CM | POA: Insufficient documentation

## 2021-09-21 DIAGNOSIS — Z1211 Encounter for screening for malignant neoplasm of colon: Secondary | ICD-10-CM | POA: Insufficient documentation

## 2021-09-21 DIAGNOSIS — R1011 Right upper quadrant pain: Secondary | ICD-10-CM | POA: Insufficient documentation

## 2021-09-21 DIAGNOSIS — Z111 Encounter for screening for respiratory tuberculosis: Secondary | ICD-10-CM | POA: Insufficient documentation

## 2021-09-21 NOTE — Assessment & Plan Note (Signed)
Lab Results  Component Value Date   HGBA1C 7.0 (H) 09/20/2021   Stable, pt to continue current medical treatment toujeo

## 2021-09-21 NOTE — Assessment & Plan Note (Signed)
Pt request repeat PPD

## 2021-09-21 NOTE — Assessment & Plan Note (Signed)
Uncontrolled, pt to restart meds including singulair, anithistamine and nasacort

## 2021-09-21 NOTE — Assessment & Plan Note (Signed)
Stable, cont current med tx, f/u endo as planned

## 2021-09-21 NOTE — Assessment & Plan Note (Signed)
New recent worsening, ? Biliary colic - for LFTs and abd u/s

## 2021-09-21 NOTE — Assessment & Plan Note (Signed)
Overall stable, cont same tx, cont to work on wt loss

## 2021-09-21 NOTE — Assessment & Plan Note (Signed)
Declines referral for wt management for now

## 2021-09-21 NOTE — Assessment & Plan Note (Signed)
Age and sex appropriate education and counseling updated with regular exercise and diet Referrals for preventative services/Immunizations addressed - had GYn appt with pap and mammogram last yr, plans to f/u next yr,  plans to see optho soon, declines flu and pnemovax and tdap and colonoscopy for now but will do cologuard, o/w up to date Smoking counseling  - none needed Evidence for depression or other mood disorder - none significant Most recent labs reviewed. I have personally reviewed and have noted: 1) the patient's medical and social history 2) The patient's current medications and supplements 3) The patient's height, weight, and BMI have been recorded in the chart

## 2021-09-21 NOTE — Assessment & Plan Note (Signed)
BP Readings from Last 3 Encounters:  09/20/21 138/80  09/19/21 140/90  08/23/21 (!) 146/70   Stable, pt to continue medical treatment losoartan, hct

## 2021-09-21 NOTE — Assessment & Plan Note (Signed)
Stable, also for f/u TFTs

## 2021-09-21 NOTE — Assessment & Plan Note (Signed)
Last vitamin D Lab Results  Component Value Date   VD25OH 12.81 (L) 09/20/2021   Low, to start oral replacement

## 2021-09-21 NOTE — Assessment & Plan Note (Signed)
Also for cologuard °

## 2021-09-23 LAB — TB SKIN TEST
Induration: 0 mm
TB Skin Test: NEGATIVE

## 2021-09-25 ENCOUNTER — Telehealth: Payer: Self-pay | Admitting: Internal Medicine

## 2021-09-25 NOTE — Telephone Encounter (Signed)
Patient is having severe cough, SOB, headache, ear pain, and drainage. Sx started on Sunday. Per pt it recurs every year. Previously, was advised to take Mucinex but since being diagnosed with Diabetes she wants to make sure she can still take that.   Please advise.

## 2021-09-25 NOTE — Telephone Encounter (Signed)
Can take plain mucinex ( not the  DM type), coricidin cold products, cough syrup, flonase nasal spray, claritin/zyrtec for drainage   If questions at pharmacy can always ask the pharmacist as well

## 2021-09-25 NOTE — Telephone Encounter (Signed)
Notified patient and patient verbalizes understanding

## 2021-09-25 NOTE — Telephone Encounter (Signed)
Patient states she would like recommendation for something OTC to take because Mucinex is not working for symptoms. Offered patient an appointment and patient states that she is unable to come in due to lack of transportation. Please advise in the absence of Dr. Jonny Ruiz 09/25/21

## 2021-10-21 ENCOUNTER — Ambulatory Visit
Admission: RE | Admit: 2021-10-21 | Discharge: 2021-10-21 | Disposition: A | Discharge status: Home / Self Care | Payer: 59 | Source: Ambulatory Visit | Attending: Internal Medicine | Admitting: Internal Medicine

## 2021-10-21 ENCOUNTER — Encounter: Payer: Self-pay | Admitting: Internal Medicine

## 2021-10-21 DIAGNOSIS — R1011 Right upper quadrant pain: Secondary | ICD-10-CM

## 2021-11-20 ENCOUNTER — Other Ambulatory Visit: Payer: Self-pay

## 2021-11-20 ENCOUNTER — Ambulatory Visit (INDEPENDENT_AMBULATORY_CARE_PROVIDER_SITE_OTHER): Payer: 59 | Admitting: Specialist

## 2021-11-20 ENCOUNTER — Ambulatory Visit (INDEPENDENT_AMBULATORY_CARE_PROVIDER_SITE_OTHER): Payer: 59

## 2021-11-20 ENCOUNTER — Encounter: Payer: Self-pay | Admitting: Specialist

## 2021-11-20 VITALS — BP 139/86 | HR 75 | Ht 65.0 in | Wt 343.0 lb

## 2021-11-20 DIAGNOSIS — Z6841 Body Mass Index (BMI) 40.0 and over, adult: Secondary | ICD-10-CM | POA: Diagnosis not present

## 2021-11-20 DIAGNOSIS — M4317 Spondylolisthesis, lumbosacral region: Secondary | ICD-10-CM

## 2021-11-20 DIAGNOSIS — G629 Polyneuropathy, unspecified: Secondary | ICD-10-CM

## 2021-11-20 DIAGNOSIS — M48062 Spinal stenosis, lumbar region with neurogenic claudication: Secondary | ICD-10-CM

## 2021-11-20 DIAGNOSIS — M25562 Pain in left knee: Secondary | ICD-10-CM

## 2021-11-20 DIAGNOSIS — E11610 Type 2 diabetes mellitus with diabetic neuropathic arthropathy: Secondary | ICD-10-CM

## 2021-11-20 DIAGNOSIS — Z794 Long term (current) use of insulin: Secondary | ICD-10-CM

## 2021-11-20 DIAGNOSIS — M4807 Spinal stenosis, lumbosacral region: Secondary | ICD-10-CM

## 2021-11-20 MED ORDER — HYDROCODONE-ACETAMINOPHEN 5-325 MG PO TABS: 1.0000 | ORAL_TABLET | Freq: Four times a day (QID) | ORAL | 0 refills | Status: DC | PRN

## 2021-11-20 NOTE — Patient Instructions (Signed)
°  Plan: Avoid bending, stooping and avoid lifting weights greater than 10 lbs. Avoid prolong standing and walking. Avoid frequent bending and stooping  No lifting greater than 10 lbs. May use ice or moist heat for pain. Weight loss is of benefit. Handicap license is approved. MRI of the lumbar spine due to progression of spondylolisthesis L4-5 and L5-S1.
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
## 2021-11-28 ENCOUNTER — Ambulatory Visit (INDEPENDENT_AMBULATORY_CARE_PROVIDER_SITE_OTHER): Payer: 59 | Admitting: Endocrinology

## 2021-11-28 ENCOUNTER — Other Ambulatory Visit: Payer: Self-pay

## 2021-11-28 ENCOUNTER — Ambulatory Visit: Payer: 59 | Admitting: Endocrinology

## 2021-11-28 ENCOUNTER — Encounter: Payer: Self-pay | Admitting: Endocrinology

## 2021-11-28 VITALS — BP 142/90 | HR 64 | Ht 65.0 in | Wt 325.2 lb

## 2021-11-28 DIAGNOSIS — E1165 Type 2 diabetes mellitus with hyperglycemia: Secondary | ICD-10-CM

## 2021-11-28 LAB — POCT GLYCOSYLATED HEMOGLOBIN (HGB A1C): Hemoglobin A1C: 5.9 % — AB (ref 4.0–5.6)

## 2021-11-28 MED ORDER — TOUJEO MAX SOLOSTAR 300 UNIT/ML ~~LOC~~ SOPN: 68.0000 [IU] | PEN_INJECTOR | SUBCUTANEOUS | 3 refills | Status: DC

## 2021-11-28 NOTE — Patient Instructions (Addendum)
check your blood sugar twice a day.  vary the time of day when you check, between before the 3 meals, and at bedtime.  also check if you have symptoms of your blood sugar being too high or too low.  please keep a record of the readings and bring it to your next appointment here (or you can bring the meter itself).  You can write it on any piece of paper.  please call us sooner if your blood sugar goes below 70, or if most of your readings are over 200.   Please reduce the insulin to 68 units each morning.    It is best to never miss the levothyroxine.  However, if you do miss it, next best is to double up the next time.   Blood tests are requested for you today.  We'll let you know about the results.  Please come back for a follow-up appointment in 2 months.
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
## 2021-11-29 LAB — TSH: TSH: 62.94 u[IU]/mL — ABNORMAL HIGH (ref 0.35–5.50)

## 2021-11-29 LAB — T4, FREE: Free T4: 0.64 ng/dL (ref 0.60–1.60)

## 2021-12-02 ENCOUNTER — Encounter: Payer: Self-pay | Admitting: Orthopaedic Surgery

## 2021-12-02 ENCOUNTER — Ambulatory Visit (INDEPENDENT_AMBULATORY_CARE_PROVIDER_SITE_OTHER): Payer: 59 | Admitting: Orthopaedic Surgery

## 2021-12-02 DIAGNOSIS — M79642 Pain in left hand: Secondary | ICD-10-CM | POA: Diagnosis not present

## 2021-12-02 DIAGNOSIS — M79641 Pain in right hand: Secondary | ICD-10-CM | POA: Diagnosis not present

## 2021-12-02 NOTE — Progress Notes (Signed)
The patient comes in with bilateral hand pain with numbness and tingling.  She actually has an MRI pending of her cervical spine and sees Dr. Otelia Sergeant for this.  She actually has had remote history of bilateral carpal tunnel releases.  She said both middle fingers are triggering.  She is a diabetic and would like to not have any type of steroid or steroid injections.  She points to the A1 pulley of both fingers as source of her pain.  She does have well-healed incisions on both hands from previous carpal tunnel surgery.  She does seem to have some weak pinch and grip strength.  She does have pain over the A1 pulley on both hands at the middle finger.  I did recommend at least her trying Voltaren gel over the A1 pulley 2-4 times a day on both middle fingers and her left wrist that has been hurting.  If the MRI of the neck does not show anything in terms of compressive neuropathy she may benefit from bilateral upper extremity nerve conduction studies to assess whether or not her carpal tunnel syndrome is still present.  I asked her to talk to Dr. Otelia Sergeant about this as well.

## 2021-12-03 ENCOUNTER — Telehealth: Payer: Self-pay | Admitting: Specialist

## 2021-12-03 NOTE — Telephone Encounter (Signed)
Pt. Called requesting copy of xray on paper and not disc. Please call pt when ready for pick up. Pt phone number is [phone].

## 2021-12-03 NOTE — Telephone Encounter (Signed)
Called and advised patient ready for pickup

## 2021-12-19 ENCOUNTER — Telehealth: Payer: Self-pay

## 2021-12-19 NOTE — Telephone Encounter (Signed)
Patients to know if it's okay for her to take a 15 day fast acting colon cleanser pill and if it will affect her diabetes management or medications.  Brand name is Financial risk analyst

## 2021-12-19 NOTE — Telephone Encounter (Signed)
Patient has been informed.

## 2021-12-24 ENCOUNTER — Other Ambulatory Visit: Payer: Self-pay

## 2021-12-24 ENCOUNTER — Encounter (HOSPITAL_COMMUNITY): Payer: Self-pay | Admitting: *Deleted

## 2021-12-24 NOTE — Progress Notes (Signed)
PCP - Oliver Barre Cardiologist - Denies EKG - 05/11/21 Chest x-ray - Denies ECHO - Denies Cardiac Cath - Denies CPAP - Yes OSA does not use CPAP DM - Pre diabetic Fasting Blood Sugar:  99-121 Checks Blood Sugar:  daily  Anesthesia review: No  -------------  SDW INSTRUCTIONS:  Your procedure is scheduled on Thursday February 23rd. Please report to [facility] Main Entrance "A" at 7:30 A.M., and check in at the Admitting office. Call this number if you have problems the morning of surgery: [phone]   You may drink clear liquids until 7am the morning of your surgery.   Clear liquids allowed are: Water, Non-Citrus Juices (without pulp), Carbonated Beverages, Clear Tea, Black Coffee Only, and Gatorade   Medications to take morning of surgery with a sip of water include: citalopram (CELEXA), levothyroxine (SYNTHROID), montelukast (SINGULAIR), triamcinolone (NASACORT) IF needed: albuterol, beclomethasone (QVAR), clonazePAM (KLONOPIN), cyclobenzaprine (FLEXERIL),  HYDROcodone-acetaminophen (NORCO/VICODIN), SUMAtriptan (IMITREX) Please bring your inhaler with you to the hospital  WHAT DO I DO ABOUT MY DIABETES MEDICATION?   Do not take oral diabetes medicines (pills) the morning of surgery.  THE MORNING OF SURGERY, DO NOT take insulin day of surgery.   HOW TO MANAGE YOUR DIABETES BEFORE AND AFTER SURGERY  How do I manage my blood sugar before surgery? Check your blood sugar at least 4 times a day, starting 2 days before surgery, to make sure that the level is not too high or low.  Check your blood sugar the morning of your surgery when you wake up and every 2 hours until you get to the Short Stay unit.  If your blood sugar is less than 70 mg/dL, you will need to treat for low blood sugar: Do not take insulin. Treat a low blood sugar (less than 70 mg/dL) with  cup of clear juice (cranberry or apple), 4 glucose tablets, OR glucose gel. Recheck blood sugar in 15 minutes after  treatment (to make sure it is greater than 70 mg/dL). If your blood sugar is not greater than 70 mg/dL on recheck, call [phone] for further instructions. Report your blood sugar to the short stay nurse when you get to Short Stay.  If you are admitted to the hospital after surgery: Your blood sugar will be checked by the staff and you will probably be given insulin after surgery (instead of oral diabetes medicines) to make sure you have good blood sugar levels. The goal for blood sugar control after surgery is 80-180 mg/dL.  As of today, STOP taking any Aspirin (unless otherwise instructed by your surgeon), Aleve, Naproxen, Ibuprofen, Motrin, Advil, Goody's, BC's, all herbal medications, fish oil, and all vitamins.    The Morning of Surgery Do not wear jewelry, make-up or nail polish. Do not wear lotions, powders, perfumes, or deodorant Do not bring valuables to the hospital. [facility] is not responsible for any belongings or valuables.  If you are a smoker, DO NOT Smoke 24 hours prior to surgery  If you wear a CPAP at night please bring your mask the morning of surgery   Remember that you must have someone to transport you home after your surgery, and remain with you for 24 hours if you are discharged the same day.  Please bring cases for contacts, glasses, hearing aids, dentures or bridgework because it cannot be worn into surgery.   Patients discharged the day of surgery will not be allowed to drive home.   Please shower the NIGHT BEFORE/MORNING OF SURGERY (use  antibacterial soap like DIAL soap if possible). Wear comfortable clothes the morning of surgery. Oral Hygiene is also important to reduce your risk of infection.  Remember - BRUSH YOUR TEETH THE MORNING OF SURGERY WITH YOUR REGULAR TOOTHPASTE  Patient denies shortness of breath, fever, cough and chest pain.

## 2021-12-26 ENCOUNTER — Ambulatory Visit (HOSPITAL_COMMUNITY): Payer: 59 | Admitting: Anesthesiology

## 2021-12-26 ENCOUNTER — Encounter (HOSPITAL_COMMUNITY): Payer: Self-pay

## 2021-12-26 ENCOUNTER — Ambulatory Visit (HOSPITAL_COMMUNITY)
Admission: RE | Admit: 2021-12-26 | Discharge: 2021-12-26 | Disposition: A | Discharge status: Home / Self Care | Payer: 59 | Attending: Specialist | Admitting: Specialist

## 2021-12-26 ENCOUNTER — Encounter (HOSPITAL_COMMUNITY): Admission: RE | Disposition: A | Discharge status: Home / Self Care | Payer: Self-pay | Source: Home / Self Care

## 2021-12-26 ENCOUNTER — Ambulatory Visit (HOSPITAL_COMMUNITY)
Admission: RE | Admit: 2021-12-26 | Discharge: 2021-12-26 | Disposition: A | Discharge status: Home / Self Care | Payer: 59 | Source: Ambulatory Visit | Attending: Specialist | Admitting: Specialist

## 2021-12-26 ENCOUNTER — Ambulatory Visit (HOSPITAL_BASED_OUTPATIENT_CLINIC_OR_DEPARTMENT_OTHER): Payer: 59 | Admitting: Anesthesiology

## 2021-12-26 DIAGNOSIS — I10 Essential (primary) hypertension: Secondary | ICD-10-CM

## 2021-12-26 DIAGNOSIS — E039 Hypothyroidism, unspecified: Secondary | ICD-10-CM

## 2021-12-26 DIAGNOSIS — D638 Anemia in other chronic diseases classified elsewhere: Secondary | ICD-10-CM

## 2021-12-26 DIAGNOSIS — M4807 Spinal stenosis, lumbosacral region: Secondary | ICD-10-CM | POA: Insufficient documentation

## 2021-12-26 DIAGNOSIS — G629 Polyneuropathy, unspecified: Secondary | ICD-10-CM | POA: Diagnosis not present

## 2021-12-26 DIAGNOSIS — M48062 Spinal stenosis, lumbar region with neurogenic claudication: Secondary | ICD-10-CM | POA: Diagnosis present

## 2021-12-26 DIAGNOSIS — M48061 Spinal stenosis, lumbar region without neurogenic claudication: Secondary | ICD-10-CM

## 2021-12-26 DIAGNOSIS — M4317 Spondylolisthesis, lumbosacral region: Secondary | ICD-10-CM | POA: Diagnosis not present

## 2021-12-26 DIAGNOSIS — M5116 Intervertebral disc disorders with radiculopathy, lumbar region: Secondary | ICD-10-CM | POA: Diagnosis not present

## 2021-12-26 HISTORY — PX: RADIOLOGY WITH ANESTHESIA: SHX6223

## 2021-12-26 HISTORY — DX: Depression, unspecified: F32.A

## 2021-12-26 HISTORY — DX: Prediabetes: R73.03

## 2021-12-26 LAB — GLUCOSE, CAPILLARY
Glucose-Capillary: 113 mg/dL — ABNORMAL HIGH (ref 70–99)
Glucose-Capillary: 104 mg/dL — ABNORMAL HIGH (ref 70–99)
Glucose-Capillary: 104 mg/dL — ABNORMAL HIGH (ref 70–99)

## 2021-12-26 LAB — POCT PREGNANCY, URINE: Preg Test, Ur: NEGATIVE

## 2021-12-26 SURGERY — MRI WITH ANESTHESIA
Anesthesia: General

## 2021-12-26 MED ORDER — DEXAMETHASONE SODIUM PHOSPHATE 10 MG/ML IJ SOLN
INTRAMUSCULAR | Status: DC | PRN
  Administered 2021-12-26: 4 mg via INTRAVENOUS

## 2021-12-26 MED ORDER — LACTATED RINGERS IV SOLN: INTRAVENOUS | Status: DC

## 2021-12-26 MED ORDER — SUGAMMADEX SODIUM 200 MG/2ML IV SOLN
INTRAVENOUS | Status: DC | PRN
  Administered 2021-12-26: 400 mg via INTRAVENOUS

## 2021-12-26 MED ORDER — CHLORHEXIDINE GLUCONATE 0.12 % MT SOLN
15.0000 mL | OROMUCOSAL | Status: AC
  Filled 2021-12-26: qty 15

## 2021-12-26 MED ORDER — FENTANYL CITRATE (PF) 250 MCG/5ML IJ SOLN
INTRAMUSCULAR | Status: DC | PRN
  Administered 2021-12-26: 100 ug via INTRAVENOUS

## 2021-12-26 MED ORDER — LIDOCAINE 2% (20 MG/ML) 5 ML SYRINGE
INTRAMUSCULAR | Status: DC | PRN
  Administered 2021-12-26: 50 mg via INTRAVENOUS

## 2021-12-26 MED ORDER — ACETAMINOPHEN 10 MG/ML IV SOLN: 1000.0000 mg | Freq: Once | INTRAVENOUS | Status: DC | PRN

## 2021-12-26 MED ORDER — PROPOFOL 10 MG/ML IV BOLUS
INTRAVENOUS | Status: DC | PRN
  Administered 2021-12-26: 200 mg via INTRAVENOUS

## 2021-12-26 MED ORDER — ONDANSETRON HCL 4 MG/2ML IJ SOLN
INTRAMUSCULAR | Status: DC | PRN
  Administered 2021-12-26: 4 mg via INTRAVENOUS

## 2021-12-26 MED ORDER — ROCURONIUM BROMIDE 10 MG/ML (PF) SYRINGE
PREFILLED_SYRINGE | INTRAVENOUS | Status: DC | PRN
  Administered 2021-12-26: 50 mg via INTRAVENOUS

## 2021-12-26 MED ORDER — INSULIN ASPART 100 UNIT/ML IJ SOLN: 0.0000 [IU] | INTRAMUSCULAR | Status: DC | PRN

## 2021-12-26 MED ORDER — CHLORHEXIDINE GLUCONATE 0.12 % MT SOLN
OROMUCOSAL | Status: AC
  Administered 2021-12-26: 15 mL via OROMUCOSAL
  Filled 2021-12-26: qty 15

## 2021-12-26 NOTE — Transfer of Care (Signed)
Immediate Anesthesia Transfer of Care Note  Patient: [name]  Procedure(s) Performed: MRI WITH OF LUMBER SPIN WITHOUT CONTRAST  Patient Location: PACU  Anesthesia Type:General  Level of Consciousness: awake, alert  and oriented  Airway & Oxygen Therapy: Patient Spontanous Breathing and Patient connected to nasal cannula oxygen  Post-op Assessment: Report given to RN, Post -op Vital signs reviewed and stable and Patient moving all extremities  Post vital signs: Reviewed and stable  Last Vitals:  Vitals Value Taken Time  BP 96/71 12/26/21 1044  Temp    Pulse 64 12/26/21 1045  Resp 15 12/26/21 1045  SpO2 98 % 12/26/21 1045  Vitals shown include unvalidated device data.  Last Pain:  Vitals:   12/26/21 0734  TempSrc:   PainSc: 7       Patients Stated Pain Goal: 5 (12/26/21 0734)  Complications: No notable events documented.

## 2021-12-26 NOTE — Anesthesia Procedure Notes (Signed)
Procedure Name: Intubation Date/Time: 12/26/2021 10:05 AM Performed by: Leonor Liv, CRNA Pre-anesthesia Checklist: Patient identified, Emergency Drugs available, Suction available and Patient being monitored Patient Re-evaluated:Patient Re-evaluated prior to induction Oxygen Delivery Method: Circle System Utilized Preoxygenation: Pre-oxygenation with 100% oxygen Induction Type: IV induction Ventilation: Oral airway inserted - appropriate to patient size and Two handed mask ventilation required Laryngoscope Size: Glidescope and 3 Grade View: Grade I Tube type: Oral Tube size: 7.0 mm Number of attempts: 1 Airway Equipment and Method: Oral airway, Video-laryngoscopy and Rigid stylet Placement Confirmation: ETT inserted through vocal cords under direct vision, positive ETCO2 and breath sounds checked- equal and bilateral Secured at: 20 cm Tube secured with: Tape Dental Injury: Teeth and Oropharynx as per pre-operative assessment

## 2021-12-26 NOTE — Progress Notes (Signed)
No labs needed for MRI today per anesthesia.

## 2021-12-26 NOTE — Anesthesia Postprocedure Evaluation (Signed)
Anesthesia Post Note  Patient: AZARYAH MOREIN  Procedure(s) Performed: MRI WITH OF LUMBER SPIN WITHOUT CONTRAST     Patient location during evaluation: PACU Anesthesia Type: General Level of consciousness: awake and alert Pain management: pain level controlled Vital Signs Assessment: post-procedure vital signs reviewed and stable Respiratory status: spontaneous breathing, nonlabored ventilation, respiratory function stable and patient connected to nasal cannula oxygen Cardiovascular status: blood pressure returned to baseline and stable Postop Assessment: no apparent nausea or vomiting Anesthetic complications: no   No notable events documented.  Last Vitals:  Vitals:   12/26/21 1100 12/26/21 1115  BP: 99/69 122/70  Pulse: 66 61  Resp: 14 17  Temp:  36.8 C  SpO2: 97% 98%    Last Pain:  Vitals:   12/26/21 1115  TempSrc:   PainSc: 0-No pain                 Belenda Cruise P Zaylei Mullane

## 2021-12-26 NOTE — Anesthesia Preprocedure Evaluation (Signed)
Anesthesia Evaluation  Patient identified by MRN, date of birth, ID band Patient awake    Reviewed: Allergy & Precautions, NPO status , Patient's Chart, lab work & pertinent test results  Airway Mallampati: III  TM Distance: >3 FB Neck ROM: Full    Dental no notable dental hx.    Pulmonary asthma , sleep apnea ,    Pulmonary exam normal        Cardiovascular hypertension, Pt. on medications  Rhythm:Regular Rate:Normal     Neuro/Psych  Headaches, Anxiety Depression    GI/Hepatic negative GI ROS, Neg liver ROS,   Endo/Other  diabetes, Type 2, Oral Hypoglycemic Agents, Insulin DependentHypothyroidism Morbid obesity  Renal/GU negative Renal ROS  negative genitourinary   Musculoskeletal  (+) Arthritis , Osteoarthritis,  Spinal stenosis   Abdominal Normal abdominal exam  (+)   Peds  Hematology  (+) Blood dyscrasia, anemia ,   Anesthesia Other Findings   Reproductive/Obstetrics                             Anesthesia Physical Anesthesia Plan  ASA: 3  Anesthesia Plan: General   Post-op Pain Management:    Induction: Intravenous  PONV Risk Score and Plan: 3 and Ondansetron, Dexamethasone and Treatment may vary due to age or medical condition  Airway Management Planned: Mask and Oral ETT  Additional Equipment: None  Intra-op Plan:   Post-operative Plan: Extubation in OR  Informed Consent: I have reviewed the patients History and Physical, chart, labs and discussed the procedure including the risks, benefits and alternatives for the proposed anesthesia with the patient or authorized representative who has indicated his/her understanding and acceptance.     Dental advisory given  Plan Discussed with: CRNA  Anesthesia Plan Comments:         Anesthesia Quick Evaluation

## 2021-12-27 ENCOUNTER — Encounter (HOSPITAL_COMMUNITY): Payer: Self-pay | Admitting: Radiology

## 2021-12-30 ENCOUNTER — Encounter: Payer: Self-pay | Admitting: Specialist

## 2021-12-30 ENCOUNTER — Ambulatory Visit (INDEPENDENT_AMBULATORY_CARE_PROVIDER_SITE_OTHER): Payer: 59 | Admitting: Specialist

## 2021-12-30 ENCOUNTER — Other Ambulatory Visit: Payer: Self-pay

## 2021-12-30 VITALS — BP 161/97 | HR 83 | Ht 65.0 in | Wt 320.0 lb

## 2021-12-30 DIAGNOSIS — M1711 Unilateral primary osteoarthritis, right knee: Secondary | ICD-10-CM

## 2021-12-30 DIAGNOSIS — M1712 Unilateral primary osteoarthritis, left knee: Secondary | ICD-10-CM

## 2021-12-30 DIAGNOSIS — Z6841 Body Mass Index (BMI) 40.0 and over, adult: Secondary | ICD-10-CM | POA: Diagnosis not present

## 2021-12-30 DIAGNOSIS — M48062 Spinal stenosis, lumbar region with neurogenic claudication: Secondary | ICD-10-CM

## 2021-12-30 DIAGNOSIS — M4317 Spondylolisthesis, lumbosacral region: Secondary | ICD-10-CM | POA: Diagnosis not present

## 2021-12-30 DIAGNOSIS — M4807 Spinal stenosis, lumbosacral region: Secondary | ICD-10-CM

## 2021-12-30 NOTE — Patient Instructions (Signed)
Avoid frequent bending and stooping  No lifting greater than 10 lbs. May use ice or moist heat for pain. Weight loss is of benefit. Best medication for lumbar disc disease is arthritis medications like motrin, celebrex and naprosyn. Exercise is important to improve your indurance and does allow people to function better inspite of back pain. Knees ar suffering from osteoarthritis, only real proven treatments are Weight loss, NSIADs like diclofenac and exercise. Well padded shoes help. Ice the knee that is suffering from osteoarthritis, Ice the knee 2-3 times a day 15-20 mins at a time.-3 times a day 15-20 mins at a time. Hot showers in the AM.  Injection with steroid may be of benefit. Hemp CBD capsules, amazon.com 5,000-7,000 mg per bottle, 60 capsules per bottle, take one capsule twice a day. Cane in the left hand to use with left leg weight bearing. Will refer you to physical therapy for treatment of your spine and left knee arthritis. When you return we will assess your neck.  Follow-Up Instructions: No follow-ups on file.
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
## 2022-01-28 ENCOUNTER — Encounter: Payer: Self-pay | Admitting: Endocrinology

## 2022-01-28 ENCOUNTER — Ambulatory Visit (INDEPENDENT_AMBULATORY_CARE_PROVIDER_SITE_OTHER): Payer: 59 | Admitting: Endocrinology

## 2022-01-28 ENCOUNTER — Other Ambulatory Visit: Payer: Self-pay

## 2022-01-28 VITALS — BP 146/84 | Wt 322.0 lb

## 2022-01-28 DIAGNOSIS — E039 Hypothyroidism, unspecified: Secondary | ICD-10-CM

## 2022-01-28 DIAGNOSIS — E1165 Type 2 diabetes mellitus with hyperglycemia: Secondary | ICD-10-CM | POA: Diagnosis not present

## 2022-01-28 LAB — POCT GLYCOSYLATED HEMOGLOBIN (HGB A1C): Hemoglobin A1C: 6.4 % — AB (ref 4.0–5.6)

## 2022-01-28 MED ORDER — TOUJEO MAX SOLOSTAR 300 UNIT/ML ~~LOC~~ SOPN: 62.0000 [IU] | PEN_INJECTOR | SUBCUTANEOUS | 3 refills | Status: DC
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
## 2022-01-28 NOTE — Patient Instructions (Addendum)
check your blood sugar twice a day.  vary the time of day when you check, between before the 3 meals, and at bedtime.  also check if you have symptoms of your blood sugar being too high or too low.  please keep a record of the readings and bring it to your next appointment here (or you can bring the meter itself).  You can write it on any piece of paper.  please call us sooner if your blood sugar goes below 70, or if most of your readings are over 200.   ?Please reduce the insulin to 62 units each morning.   ?It is best to never miss the levothyroxine.  However, if you do miss it, next best is to double up the next time.   ?Blood tests are requested for you today.  We'll let you know about the results.   ?Please come back for a follow-up appointment in 3 months.   ? ? ?

## 2022-01-30 ENCOUNTER — Telehealth: Payer: Self-pay | Admitting: Internal Medicine

## 2022-01-30 MED ORDER — HYDROCORTISONE (PERIANAL) 2.5 % EX CREA
1.0000 | TOPICAL_CREAM | Freq: Two times a day (BID) | CUTANEOUS | 1 refills | Status: DC
Start: 2022-01-30 — End: 2024-01-28

## 2022-01-30 NOTE — Telephone Encounter (Signed)
Done erx 

## 2022-01-30 NOTE — Telephone Encounter (Signed)
PT calls today in regards to getting a medication refilled. PT states she had what was thought to be hemorrhoids a year or two ago and was prescribed a cream for that by Dr.John. She would like that prescribed again to her if possible due to these symptoms showing back up again. She wasn't sure what the name of the cream was that Dr.John had prescribed her but looking at the medication list we were thinking it was the Cutivate. ? ?CB: [phone] ?

## 2022-02-06 ENCOUNTER — Ambulatory Visit (INDEPENDENT_AMBULATORY_CARE_PROVIDER_SITE_OTHER): Payer: 59 | Admitting: Specialist

## 2022-02-06 ENCOUNTER — Ambulatory Visit: Payer: Self-pay

## 2022-02-06 ENCOUNTER — Encounter: Payer: Self-pay | Admitting: Specialist

## 2022-02-06 VITALS — BP 128/83 | HR 73 | Ht 65.0 in | Wt 322.0 lb

## 2022-02-06 DIAGNOSIS — G935 Compression of brain: Secondary | ICD-10-CM

## 2022-02-06 DIAGNOSIS — M4722 Other spondylosis with radiculopathy, cervical region: Secondary | ICD-10-CM

## 2022-02-06 DIAGNOSIS — M4807 Spinal stenosis, lumbosacral region: Secondary | ICD-10-CM | POA: Diagnosis not present

## 2022-02-06 DIAGNOSIS — R200 Anesthesia of skin: Secondary | ICD-10-CM

## 2022-02-06 DIAGNOSIS — M4317 Spondylolisthesis, lumbosacral region: Secondary | ICD-10-CM | POA: Diagnosis not present

## 2022-02-06 DIAGNOSIS — M542 Cervicalgia: Secondary | ICD-10-CM

## 2022-02-06 DIAGNOSIS — R202 Paresthesia of skin: Secondary | ICD-10-CM
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
## 2022-02-06 NOTE — Patient Instructions (Signed)
Plan: Avoid overhead lifting and overhead use of the arms. ?Do not lift greater than 5 lbs. ?Adjust head rest in vehicle to prevent hyperextension if rear ended. ?Take extra precautions to avoid falling, including use of a cane if you feel weak.MRI of the cervical spine is ordered. ?Follow-Up Instructions: Return in about 4 weeks (around 03/06/2022).  ?Avoid bending, stooping and avoid lifting weights greater than 10 lbs. ?Avoid prolong standing and walking. ?Avoid frequent bending and stooping  ?No lifting greater than 10 lbs. ?May use ice or moist heat for pain. ?Weight loss is of benefit. ?Handicap license is approved. ?Letter of Disability is supported for both cervical disease and lumbar disease with expected permanent impairment on a full time basis.  ?Orders:  ?

## 2022-02-10 ENCOUNTER — Encounter: Payer: Self-pay | Admitting: Internal Medicine

## 2022-02-10 ENCOUNTER — Ambulatory Visit (INDEPENDENT_AMBULATORY_CARE_PROVIDER_SITE_OTHER): Payer: 59 | Admitting: Internal Medicine

## 2022-02-10 VITALS — BP 140/80 | HR 68 | Temp 98.9°F | Ht 65.0 in | Wt 322.0 lb

## 2022-02-10 DIAGNOSIS — L309 Dermatitis, unspecified: Secondary | ICD-10-CM | POA: Diagnosis not present

## 2022-02-10 DIAGNOSIS — E039 Hypothyroidism, unspecified: Secondary | ICD-10-CM

## 2022-02-10 DIAGNOSIS — J069 Acute upper respiratory infection, unspecified: Secondary | ICD-10-CM

## 2022-02-10 DIAGNOSIS — L709 Acne, unspecified: Secondary | ICD-10-CM | POA: Diagnosis not present

## 2022-02-10 DIAGNOSIS — F5101 Primary insomnia: Secondary | ICD-10-CM

## 2022-02-10 DIAGNOSIS — G4733 Obstructive sleep apnea (adult) (pediatric): Secondary | ICD-10-CM

## 2022-02-10 DIAGNOSIS — E559 Vitamin D deficiency, unspecified: Secondary | ICD-10-CM | POA: Diagnosis not present

## 2022-02-10 DIAGNOSIS — E1165 Type 2 diabetes mellitus with hyperglycemia: Secondary | ICD-10-CM

## 2022-02-10 MED ORDER — TRAZODONE HCL 100 MG PO TABS
ORAL_TABLET | ORAL | 1 refills | Status: DC
Start: 2022-02-10 — End: 2024-01-28

## 2022-02-10 MED ORDER — TRIAMCINOLONE ACETONIDE 0.1 % EX CREA: 1.0000 "application " | TOPICAL_CREAM | Freq: Two times a day (BID) | CUTANEOUS | 1 refills | Status: AC

## 2022-02-10 MED ORDER — AZITHROMYCIN 250 MG PO TABS
ORAL_TABLET | ORAL | 1 refills | Status: AC
Start: 2022-02-10 — End: 2022-02-15

## 2022-02-10 NOTE — Patient Instructions (Addendum)
Please take all new medication as prescribed - the steroid cream for the ears, and antibiotic, and trazodone for sleep ? ?You will be contacted regarding the referral for: dermatology ? ?Please continue all other medications as before, and refills have been done if requested. ? ?Please have the pharmacy call with any other refills you may need. ? ?Please continue your efforts at being more active, low cholesterol diet, and weight control. ? ?Please keep your appointments with your specialists as you may have planned ? ?Please make an Appointment to return in 6 months, or sooner if needed ?
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
## 2022-02-14 ENCOUNTER — Encounter: Payer: Self-pay | Admitting: Internal Medicine

## 2022-02-14 DIAGNOSIS — G4700 Insomnia, unspecified: Secondary | ICD-10-CM | POA: Insufficient documentation

## 2022-02-14 NOTE — Assessment & Plan Note (Signed)
Mild to mod, for antibx course,  to f/u any worsening symptoms or concerns 

## 2022-02-14 NOTE — Assessment & Plan Note (Addendum)
Mild to mod, for azithro today, also derm referral per pt reqeust ?

## 2022-02-14 NOTE — Assessment & Plan Note (Signed)
Recent worsening with increased stressors - for trazodone qhs prn ?

## 2022-02-14 NOTE — Assessment & Plan Note (Signed)
Last vitamin D ?Lab Results  ?Component Value Date  ? VD25OH 12.81 (L) 09/20/2021  ? ?Low, reminded to start oral replacement ?

## 2022-02-14 NOTE — Assessment & Plan Note (Signed)
Pt encouraged for last f/u with endo Dr Everardo All as planned,  ? ?Lab Results  ?Component Value Date  ? TSH 62.94 (H) 11/28/2021  ? ?Uncontrolled but on increased med, pt to continue levothyroxine and f/u as above ?

## 2022-02-14 NOTE — Assessment & Plan Note (Signed)
Mostly ears today - for triam cr prn asd topical,  to f/u any worsening symptoms or concerns ?

## 2022-02-14 NOTE — Assessment & Plan Note (Signed)
Lab Results  ?Component Value Date  ? HGBA1C 6.4 (A) 01/28/2022  ? ?Stable, pt to continue current medical treatment toujeo ? ?

## 2022-02-14 NOTE — Assessment & Plan Note (Signed)
Pt encouraged for regular f/u with pulm - pt will call ?

## 2022-02-25 ENCOUNTER — Telehealth: Payer: Self-pay | Admitting: Specialist

## 2022-02-25 NOTE — Telephone Encounter (Signed)
Pt called stating Dr. Otelia Sergeant was suppose to give her a letter for disability. She has disability court today. Pt states letter was suppose to have been drawn up 4/20. Pt need to pick this up as soon as possible. Please call pt at [phone] ?

## 2022-02-27 NOTE — Telephone Encounter (Signed)
Printed for Dr. Nitka to review. 

## 2022-03-11 ENCOUNTER — Other Ambulatory Visit: Payer: Self-pay

## 2022-03-11 ENCOUNTER — Encounter (HOSPITAL_COMMUNITY): Payer: Self-pay | Admitting: *Deleted

## 2022-03-11 NOTE — Progress Notes (Signed)
Spoke with pt for pre-op call. Pt states she has recently had same procedure done and nothing has changed with her medical and surgical history. Pt is diabetic. Last A1C was 6.4 on 01/28/22. Instructed pt to check her blood sugar when she wakes up Thursday AM and every 2 hours until she leaves for the hospital. If blood sugar is 70 or below, treat with 1/2 cup of clear juice (apple or cranberry) and recheck blood sugar 15 minutes after drinking juice. If blood sugar continues to be 70 or below, call the Short Stay department and ask to speak to a nurse. Pt voiced understanding. ? ? ?Shower instructions given to pt. ?

## 2022-03-12 ENCOUNTER — Other Ambulatory Visit: Payer: Self-pay | Admitting: Specialist

## 2022-03-12 MED ORDER — ACETAMINOPHEN-CODEINE #3 300-30 MG PO TABS: 1.0000 | ORAL_TABLET | Freq: Four times a day (QID) | ORAL | 0 refills | Status: DC | PRN

## 2022-03-12 NOTE — Telephone Encounter (Signed)
Patient called needing Rx refilled Tylenol with Codeine. The number to contact patient is [phone] ?

## 2022-03-12 NOTE — Anesthesia Preprocedure Evaluation (Addendum)
Anesthesia Evaluation  ?Patient identified by MRN, date of birth, ID band ?Patient awake ? ? ? ?Reviewed: ?Allergy & Precautions, NPO status , Patient's Chart, lab work & pertinent test results ? ?History of Anesthesia Complications ?Negative for: history of anesthetic complications ? ?Airway ?Mallampati: II ? ?TM Distance: >3 FB ?Neck ROM: Full ? ? ? Dental ? ?(+) Dental Advisory Given, Teeth Intact ?  ?Pulmonary ?asthma , sleep apnea ,  ?  ?Pulmonary exam normal ? ? ? ? ? ? ? Cardiovascular ?hypertension, Pt. on medications ?Normal cardiovascular exam ? ? ?  ?Neuro/Psych ? Headaches, Anxiety Depression   ? GI/Hepatic ?negative GI ROS, Neg liver ROS,   ?Endo/Other  ?diabetes, Type 2, Insulin DependentHypothyroidism Morbid obesity ? Renal/GU ?negative Renal ROS  ?negative genitourinary ?  ?Musculoskeletal ?negative musculoskeletal ROS ?(+)  ? Abdominal ?  ?Peds ? Hematology ?negative hematology ROS ?(+)   ?Anesthesia Other Findings ? ? Reproductive/Obstetrics ? ?  ? ? ? ? ? ? ? ? ? ? ? ? ? ?  ?  ? ? ? ? ? ? ? ?Anesthesia Physical ?Anesthesia Plan ? ?ASA: 3 ? ?Anesthesia Plan: General  ? ?Post-op Pain Management: Minimal or no pain anticipated  ? ?Induction: Intravenous ? ?PONV Risk Score and Plan: 3 and Ondansetron, Dexamethasone, Treatment may vary due to age or medical condition and Midazolam ? ?Airway Management Planned: Oral ETT ? ?Additional Equipment: None ? ?Intra-op Plan:  ? ?Post-operative Plan: Extubation in OR ? ?Informed Consent: I have reviewed the patients History and Physical, chart, labs and discussed the procedure including the risks, benefits and alternatives for the proposed anesthesia with the patient or authorized representative who has indicated his/her understanding and acceptance.  ? ? ? ?Dental advisory given ? ?Plan Discussed with:  ? ?Anesthesia Plan Comments:   ? ? ? ? ? ?Anesthesia Quick Evaluation ? ?

## 2022-03-13 ENCOUNTER — Ambulatory Visit (HOSPITAL_COMMUNITY)
Admission: RE | Admit: 2022-03-13 | Discharge: 2022-03-13 | Disposition: A | Discharge status: Home / Self Care | Payer: 59 | Attending: Specialist | Admitting: Specialist

## 2022-03-13 ENCOUNTER — Ambulatory Visit (HOSPITAL_COMMUNITY)
Admission: RE | Admit: 2022-03-13 | Discharge: 2022-03-13 | Disposition: A | Discharge status: Home / Self Care | Payer: 59 | Source: Ambulatory Visit | Attending: Specialist | Admitting: Specialist

## 2022-03-13 ENCOUNTER — Encounter (HOSPITAL_COMMUNITY): Admission: RE | Disposition: A | Discharge status: Home / Self Care | Payer: Self-pay | Source: Home / Self Care

## 2022-03-13 ENCOUNTER — Ambulatory Visit (HOSPITAL_BASED_OUTPATIENT_CLINIC_OR_DEPARTMENT_OTHER): Payer: 59 | Admitting: Anesthesiology

## 2022-03-13 ENCOUNTER — Ambulatory Visit (HOSPITAL_COMMUNITY): Payer: 59 | Admitting: Anesthesiology

## 2022-03-13 ENCOUNTER — Encounter (HOSPITAL_COMMUNITY): Payer: Self-pay

## 2022-03-13 DIAGNOSIS — I10 Essential (primary) hypertension: Secondary | ICD-10-CM | POA: Insufficient documentation

## 2022-03-13 DIAGNOSIS — E119 Type 2 diabetes mellitus without complications: Secondary | ICD-10-CM

## 2022-03-13 DIAGNOSIS — M542 Cervicalgia: Secondary | ICD-10-CM

## 2022-03-13 DIAGNOSIS — G4730 Sleep apnea, unspecified: Secondary | ICD-10-CM | POA: Diagnosis not present

## 2022-03-13 DIAGNOSIS — M4802 Spinal stenosis, cervical region: Secondary | ICD-10-CM | POA: Diagnosis not present

## 2022-03-13 DIAGNOSIS — G935 Compression of brain: Secondary | ICD-10-CM

## 2022-03-13 DIAGNOSIS — Z794 Long term (current) use of insulin: Secondary | ICD-10-CM

## 2022-03-13 DIAGNOSIS — R200 Anesthesia of skin: Secondary | ICD-10-CM | POA: Diagnosis present

## 2022-03-13 DIAGNOSIS — Z6841 Body Mass Index (BMI) 40.0 and over, adult: Secondary | ICD-10-CM | POA: Diagnosis not present

## 2022-03-13 DIAGNOSIS — M47812 Spondylosis without myelopathy or radiculopathy, cervical region: Secondary | ICD-10-CM | POA: Diagnosis not present

## 2022-03-13 DIAGNOSIS — M4722 Other spondylosis with radiculopathy, cervical region: Secondary | ICD-10-CM

## 2022-03-13 DIAGNOSIS — J45909 Unspecified asthma, uncomplicated: Secondary | ICD-10-CM | POA: Diagnosis not present

## 2022-03-13 HISTORY — PX: RADIOLOGY WITH ANESTHESIA: SHX6223

## 2022-03-13 HISTORY — DX: Type 2 diabetes mellitus without complications: E11.9

## 2022-03-13 LAB — POCT PREGNANCY, URINE: Preg Test, Ur: NEGATIVE

## 2022-03-13 LAB — GLUCOSE, CAPILLARY
Glucose-Capillary: 141 mg/dL — ABNORMAL HIGH (ref 70–99)
Glucose-Capillary: 111 mg/dL — ABNORMAL HIGH (ref 70–99)

## 2022-03-13 SURGERY — MRI WITH ANESTHESIA
Anesthesia: General

## 2022-03-13 MED ORDER — PROPOFOL 10 MG/ML IV BOLUS
INTRAVENOUS | Status: DC | PRN
  Administered 2022-03-13: 200 mg via INTRAVENOUS

## 2022-03-13 MED ORDER — SUGAMMADEX SODIUM 200 MG/2ML IV SOLN
INTRAVENOUS | Status: DC | PRN
  Administered 2022-03-13: 400 mg via INTRAVENOUS

## 2022-03-13 MED ORDER — FENTANYL CITRATE (PF) 250 MCG/5ML IJ SOLN
INTRAMUSCULAR | Status: DC | PRN
  Administered 2022-03-13: 50 ug via INTRAVENOUS

## 2022-03-13 MED ORDER — MIDAZOLAM HCL 2 MG/2ML IJ SOLN
INTRAMUSCULAR | Status: DC | PRN
  Administered 2022-03-13: 1 mg via INTRAVENOUS

## 2022-03-13 MED ORDER — DEXAMETHASONE SODIUM PHOSPHATE 10 MG/ML IJ SOLN
INTRAMUSCULAR | Status: DC | PRN
  Administered 2022-03-13: 10 mg via INTRAVENOUS

## 2022-03-13 MED ORDER — PHENYLEPHRINE HCL-NACL 20-0.9 MG/250ML-% IV SOLN
INTRAVENOUS | Status: DC | PRN
  Administered 2022-03-13: 25 ug/min via INTRAVENOUS

## 2022-03-13 MED ORDER — EPHEDRINE SULFATE-NACL 50-0.9 MG/10ML-% IV SOSY
PREFILLED_SYRINGE | INTRAVENOUS | Status: DC | PRN
  Administered 2022-03-13: 5 mg via INTRAVENOUS

## 2022-03-13 MED ORDER — CHLORHEXIDINE GLUCONATE 0.12 % MT SOLN
OROMUCOSAL | Status: AC
  Filled 2022-03-13: qty 15

## 2022-03-13 MED ORDER — CHLORHEXIDINE GLUCONATE 0.12 % MT SOLN
15.0000 mL | OROMUCOSAL | Status: AC
  Administered 2022-03-13: 15 mL via OROMUCOSAL
  Filled 2022-03-13: qty 15

## 2022-03-13 MED ORDER — LACTATED RINGERS IV SOLN: INTRAVENOUS | Status: DC

## 2022-03-13 MED ORDER — SUCCINYLCHOLINE CHLORIDE 200 MG/10ML IV SOSY
PREFILLED_SYRINGE | INTRAVENOUS | Status: DC | PRN
  Administered 2022-03-13: 180 mg via INTRAVENOUS

## 2022-03-13 MED ORDER — LIDOCAINE 2% (20 MG/ML) 5 ML SYRINGE
INTRAMUSCULAR | Status: DC | PRN
  Administered 2022-03-13: 100 mg via INTRAVENOUS

## 2022-03-13 MED ORDER — ONDANSETRON HCL 4 MG/2ML IJ SOLN
INTRAMUSCULAR | Status: DC | PRN
  Administered 2022-03-13: 4 mg via INTRAVENOUS

## 2022-03-13 MED ORDER — ROCURONIUM BROMIDE 10 MG/ML (PF) SYRINGE
PREFILLED_SYRINGE | INTRAVENOUS | Status: DC | PRN
  Administered 2022-03-13: 10 mg via INTRAVENOUS
  Administered 2022-03-13: 40 mg via INTRAVENOUS

## 2022-03-13 MED ORDER — AMISULPRIDE (ANTIEMETIC) 5 MG/2ML IV SOLN: 10.0000 mg | Freq: Once | INTRAVENOUS | Status: DC | PRN

## 2022-03-13 MED ORDER — ONDANSETRON HCL 4 MG/2ML IJ SOLN: 4.0000 mg | Freq: Once | INTRAMUSCULAR | Status: DC | PRN

## 2022-03-13 NOTE — Transfer of Care (Signed)
Immediate Anesthesia Transfer of Care Note ? ?Patient: Vanessa [name] ? ?Procedure(s) Performed: MRI WITH ANESTHESIA OF CERVICAL SPINE WITHOUT CONTRAST ? ?Patient Location: PACU ? ?Anesthesia Type:General ? ?Level of Consciousness: awake, alert  and oriented ? ?Airway & Oxygen Therapy: Patient Spontanous Breathing and Patient connected to face mask oxygen ? ?Post-op Assessment: Report given to RN and Post -op Vital signs reviewed and stable ? ?Post vital signs: Reviewed and stable ? ?Last Vitals:  ?Vitals Value Taken Time  ?BP 131/81 03/13/22 1031  ?Temp 36.4 ?C 03/13/22 1030  ?Pulse 76 03/13/22 1032  ?Resp 15 03/13/22 1032  ?SpO2 100 % 03/13/22 1032  ?Vitals shown include unvalidated device data. ? ?Last Pain:  ?Vitals:  ? 03/13/22 0831  ?TempSrc:   ?PainSc: 7   ?   ? ?Patients Stated Pain Goal: 5 (03/13/22 0831) ? ?Complications: No notable events documented. ?

## 2022-03-13 NOTE — Anesthesia Procedure Notes (Signed)
Procedure Name: Intubation ?Date/Time: 03/13/2022 9:27 AM ?Performed by: Bryson Corona, CRNA ?Pre-anesthesia Checklist: Patient identified, Emergency Drugs available, Suction available and Patient being monitored ?Patient Re-evaluated:Patient Re-evaluated prior to induction ?Oxygen Delivery Method: Circle System Utilized ?Preoxygenation: Pre-oxygenation with 100% oxygen ?Induction Type: IV induction ?Ventilation: Mask ventilation without difficulty ?Laryngoscope Size: 3 and Glidescope ?Grade View: Grade I ?Tube type: Oral ?Tube size: 7.0 mm ?Number of attempts: 1 ?Airway Equipment and Method: Stylet ?Placement Confirmation: ETT inserted through vocal cords under direct vision, positive ETCO2 and breath sounds checked- equal and bilateral ?Secured at: 22 cm ?Tube secured with: Tape ?Dental Injury: Teeth and Oropharynx as per pre-operative assessment  ?Difficulty Due To: Difficulty was anticipated, Difficult Airway- due to reduced neck mobility and Difficult Airway- due to large tongue ?Comments: glidescope go intubation in MRI. ? ? ? ? ?

## 2022-03-13 NOTE — Progress Notes (Signed)
CBG 141, anesthesia did not want to start peri-op hyperglycemia protocol at this time.  ?

## 2022-03-13 NOTE — Anesthesia Postprocedure Evaluation (Signed)
Anesthesia Post Note ? ?Patient: Vanessa [name] ? ?Procedure(s) Performed: MRI WITH ANESTHESIA OF CERVICAL SPINE WITHOUT CONTRAST ? ?  ? ?Patient location during evaluation: PACU ?Anesthesia Type: General ?Level of consciousness: awake and alert ?Pain management: pain level controlled ?Vital Signs Assessment: post-procedure vital signs reviewed and stable ?Respiratory status: spontaneous breathing, nonlabored ventilation and respiratory function stable ?Cardiovascular status: blood pressure returned to baseline and stable ?Postop Assessment: no apparent nausea or vomiting ?Anesthetic complications: no ? ? ?No notable events documented. ? ?Last Vitals:  ?Vitals:  ? 03/13/22 1045 03/13/22 1100  ?BP: 123/71 110/70  ?Pulse: 65 (!) 58  ?Resp: 15 17  ?Temp:  36.6 ?C  ?SpO2: 99% 100%  ?  ?Last Pain:  ?Vitals:  ? 03/13/22 0831  ?TempSrc:   ?PainSc: 7   ? ? ?  ?  ?  ?  ?  ?  ? ?Lidia Collum ? ? ? ? ?

## 2022-03-14 ENCOUNTER — Encounter (HOSPITAL_COMMUNITY): Payer: Self-pay | Admitting: Radiology

## 2022-03-18 ENCOUNTER — Telehealth: Payer: Self-pay | Admitting: Internal Medicine

## 2022-03-18 NOTE — Telephone Encounter (Signed)
Pt called in and states that referral for dermatologist can't be seen in their office until December.  ? ?Wants to know if there is something that MD can prescribe to help with issue. Also, requesting a referral to an Radiation protection practitioner.  ?

## 2022-03-19 ENCOUNTER — Telehealth: Payer: Self-pay | Admitting: Internal Medicine

## 2022-03-19 NOTE — Telephone Encounter (Signed)
Unable to reach patient.

## 2022-03-19 NOTE — Telephone Encounter (Signed)
Very sorry, I am not sure what other tx to do for now ? ?And I would not feel comfortable to specify an african Insurance account manager ,as this would me feel as if I was descriminating and racist ?

## 2022-03-19 NOTE — Telephone Encounter (Signed)
Fortunately for her, this is OTC, as high dose Vit D has not been shown to effective with bone fractures or emotional disorders or skin problems -  ? ?Ok to take  Please take OTC Vitamin D3 at 2000 units per day, indefinitely, or 4000 units if you already take the 2000 units.   ?

## 2022-03-19 NOTE — Telephone Encounter (Signed)
Patient would like to know if you can send Vitamin D pills - she said this helped with her acne.  Please send to [facility] on [address] . ?

## 2022-03-20 ENCOUNTER — Ambulatory Visit (INDEPENDENT_AMBULATORY_CARE_PROVIDER_SITE_OTHER): Payer: 59 | Admitting: Specialist

## 2022-03-20 ENCOUNTER — Encounter: Payer: Self-pay | Admitting: Specialist

## 2022-03-20 VITALS — BP 141/83 | HR 82 | Ht 65.0 in | Wt 320.0 lb

## 2022-03-20 DIAGNOSIS — M4722 Other spondylosis with radiculopathy, cervical region: Secondary | ICD-10-CM | POA: Diagnosis not present

## 2022-03-20 DIAGNOSIS — Z6841 Body Mass Index (BMI) 40.0 and over, adult: Secondary | ICD-10-CM | POA: Diagnosis not present

## 2022-03-20 DIAGNOSIS — R200 Anesthesia of skin: Secondary | ICD-10-CM

## 2022-03-20 DIAGNOSIS — M170 Bilateral primary osteoarthritis of knee: Secondary | ICD-10-CM

## 2022-03-20 DIAGNOSIS — R202 Paresthesia of skin: Secondary | ICD-10-CM

## 2022-03-20 DIAGNOSIS — G935 Compression of brain: Secondary | ICD-10-CM

## 2022-03-20 MED ORDER — GABAPENTIN 100 MG PO CAPS: ORAL_CAPSULE | ORAL | 0 refills | Status: DC
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
## 2022-03-20 NOTE — Addendum Note (Signed)
Addended by: Vira Browns on: 03/20/2022 03:36 PM   Modules accepted: Orders

## 2022-03-20 NOTE — Patient Instructions (Addendum)
Plan: Avoid overhead lifting and overhead use of the arms. Do not lift greater than 5 lbs. Adjust head rest in vehicle to prevent hyperextension if rear ended. Take extra precautions to avoid falling. PT to work on the neck to decrease pressure on the nerve and decrease the neck pain.  Knee is suffering from osteoarthritis, only real proven treatments are Weight loss, NSIADs like diclofenac gel and exercise. Well padded shoes help. Ice the knee that is suffering from osteoarthritis, only real proven treatments are Weight loss, NSIADs like diclofenac gel and exercise. Well padded shoes help. Ice the knee 2-3 times a day 15-20 mins at a time.-3 times a day 15-20 mins at a time. Hot showers in the AM.  Injection with steroid may be of benefit. Hemp CBD capsules, [email or web address] 5,000-7,000 mg per bottle, 60 capsules per bottle, take one capsule twice a day. Cane in the hand to use with leg weight bearing. Follow-Up Instructions: No follow-ups on file.   Follow-Up Instructions: Return in about 4 weeks (around 04/17/2022).

## 2022-03-24 ENCOUNTER — Ambulatory Visit: Payer: 59 | Admitting: Endocrinology

## 2022-03-24 ENCOUNTER — Encounter: Payer: Self-pay | Admitting: Internal Medicine

## 2022-03-24 ENCOUNTER — Ambulatory Visit (INDEPENDENT_AMBULATORY_CARE_PROVIDER_SITE_OTHER): Payer: 59 | Admitting: Internal Medicine

## 2022-03-24 VITALS — BP 140/78 | HR 78 | Ht 65.0 in | Wt 327.4 lb

## 2022-03-24 DIAGNOSIS — E039 Hypothyroidism, unspecified: Secondary | ICD-10-CM

## 2022-03-24 DIAGNOSIS — E1165 Type 2 diabetes mellitus with hyperglycemia: Secondary | ICD-10-CM
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
## 2022-03-24 NOTE — Patient Instructions (Addendum)
Please continue: - Toujeo 62 units daily in am  Please continue Levothyroxine 200 mcg daily.  Chew the Levothyroxine tablet.  Take the thyroid hormone every day, with water, at least 30 minutes before breakfast, separated by at least 4 hours from: - acid reflux medications - calcium - iron - multivitamins  Please stop at the lab.  Please return in 4 months but likely sooner for labs.  PATIENT INSTRUCTIONS FOR TYPE 2 DIABETES:  **Please join MyChart!** - see attached instructions about how to join if you have not done so already.  DIET AND EXERCISE Diet and exercise is an important part of diabetic treatment.  We recommended aerobic exercise in the form of brisk walking (working between 40-60% of maximal aerobic capacity, similar to brisk walking) for 150 minutes per week (such as 30 minutes five days per week) along with 3 times per week performing 'resistance' training (using various gauge rubber tubes with handles) 5-10 exercises involving the major muscle groups (upper body, lower body and core) performing 10-15 repetitions (or near fatigue) each exercise. Start at half the above goal but build slowly to reach the above goals. If limited by weight, joint pain, or disability, we recommend daily walking in a swimming pool with water up to waist to reduce pressure from joints while allow for adequate exercise.    BLOOD GLUCOSES Monitoring your blood glucoses is important for continued management of your diabetes. Please check your blood glucoses 2-4 times a day: fasting, before meals and at bedtime (you can rotate these measurements - e.g. one day check before the 3 meals, the next day check before 2 of the meals and before bedtime, etc.).   HYPOGLYCEMIA (low blood sugar) Hypoglycemia is usually a reaction to not eating, exercising, or taking too much insulin/ other diabetes drugs.  Symptoms include tremors, sweating, hunger, confusion, headache, etc. Treat IMMEDIATELY with 15 grams of  Carbs: 4 glucose tablets  cup regular juice/soda 2 tablespoons raisins 4 teaspoons sugar 1 tablespoon honey Recheck blood glucose in 15 mins and repeat above if still symptomatic/blood glucose <100.  RECOMMENDATIONS TO REDUCE YOUR RISK OF DIABETIC COMPLICATIONS: * Take your prescribed MEDICATION(S) * Follow a DIABETIC diet: Complex carbs, fiber rich foods, (monounsaturated and polyunsaturated) fats * AVOID saturated/trans fats, high fat foods, >2,300 mg salt per day. * EXERCISE at least 5 times a week for 30 minutes or preferably daily.  * DO NOT SMOKE OR DRINK more than 1 drink a day. * Check your FEET every day. Do not wear tightfitting shoes. Contact us if you develop an ulcer * See your EYE doctor once a year or more if needed * Get a FLU shot once a year * Get a PNEUMONIA vaccine once before and once after age 78 years  GOALS:  * Your Hemoglobin A1c of <7%  * fasting sugars need to be <130 * after meals sugars need to be <180 (2h after you start eating) * Your Systolic BP should be 140 or lower  * Your Diastolic BP should be 80 or lower  * Your HDL (Good Cholesterol) should be 40 or higher  * Your LDL (Bad Cholesterol) should be 100 or lower. * Your Triglycerides should be 150 or lower  * Your Urine microalbumin (kidney function) should be <30 * Your Body Mass Index should be 25 or lower

## 2022-03-25 LAB — T4, FREE: Free T4: 1.06 ng/dL (ref 0.60–1.60)

## 2022-03-25 LAB — ANTI-ISLET CELL ANTIBODY: Islet Cell Ab: NEGATIVE

## 2022-03-25 LAB — TSH: TSH: 12.75 u[IU]/mL — ABNORMAL HIGH (ref 0.35–5.50)

## 2022-03-27 ENCOUNTER — Encounter: Payer: Self-pay | Admitting: Physical Medicine & Rehabilitation

## 2022-03-27 ENCOUNTER — Telehealth: Payer: Self-pay | Admitting: Internal Medicine

## 2022-03-27 DIAGNOSIS — L309 Dermatitis, unspecified: Secondary | ICD-10-CM

## 2022-03-27 NOTE — Telephone Encounter (Signed)
PT calls today in need of a refill on their fluticasone (CUTIVATE) 0.05% cream. PT would like this sent out to the [facility] on [address] that's on PT's chart.

## 2022-03-28 MED ORDER — FLUTICASONE PROPIONATE 0.05 % EX CREA: 1.0000 "application " | TOPICAL_CREAM | Freq: Two times a day (BID) | CUTANEOUS | 1 refills | Status: AC | PRN

## 2022-03-28 NOTE — Telephone Encounter (Signed)
Cream was sent to [facility]../l,mb

## 2022-04-02 LAB — GLUTAMIC ACID DECARBOXYLASE AUTO ABS: Glutamic Acid Decarb Ab: <5 IU/mL (ref <5)

## 2022-04-02 LAB — C-PEPTIDE: C-Peptide: 3.52 ng/mL (ref 0.80–3.85)

## 2022-04-02 LAB — ZNT8 ANTIBODIES: ZNT8 Antibodies: <10 U/mL (ref <15)

## 2022-04-02 LAB — GLUCOSE, FASTING: Glucose, Bld: 96 mg/dL (ref 65–99)

## 2022-04-22 ENCOUNTER — Ambulatory Visit: Payer: 59 | Admitting: Allergy & Immunology

## 2022-04-30 ENCOUNTER — Telehealth: Payer: Self-pay | Admitting: Internal Medicine

## 2022-04-30 DIAGNOSIS — J302 Other seasonal allergic rhinitis: Secondary | ICD-10-CM

## 2022-04-30 NOTE — Telephone Encounter (Signed)
Pt called and would like to be referred to an ENT. Pt is suffering from extreme headaches, nasal drip, and coughing. Says this happens every year when the summer starts and mediation gives no relief.   Please Advise.

## 2022-05-01 NOTE — Telephone Encounter (Signed)
Actually allergy would be better for allergy symptoms - is that ok?

## 2022-05-01 NOTE — Telephone Encounter (Signed)
Ok done  - referral to allergy

## 2022-05-01 NOTE — Telephone Encounter (Signed)
Patient states okay for allergy referral

## 2022-05-05 ENCOUNTER — Encounter
Payer: Commercial Managed Care - HMO | Attending: Physical Medicine & Rehabilitation | Admitting: Physical Medicine & Rehabilitation

## 2022-05-05 ENCOUNTER — Telehealth: Payer: Self-pay | Admitting: *Deleted

## 2022-05-05 ENCOUNTER — Encounter: Payer: Self-pay | Admitting: Physical Medicine & Rehabilitation

## 2022-05-05 VITALS — BP 122/72 | HR 83 | Ht 65.0 in | Wt 315.0 lb

## 2022-05-05 DIAGNOSIS — M47812 Spondylosis without myelopathy or radiculopathy, cervical region: Secondary | ICD-10-CM | POA: Insufficient documentation

## 2022-05-05 DIAGNOSIS — Q0700 Arnold-Chiari syndrome without spina bifida or hydrocephalus: Secondary | ICD-10-CM | POA: Diagnosis not present

## 2022-05-05 DIAGNOSIS — G894 Chronic pain syndrome: Secondary | ICD-10-CM | POA: Diagnosis not present

## 2022-05-05 DIAGNOSIS — Z5181 Encounter for therapeutic drug level monitoring: Secondary | ICD-10-CM | POA: Insufficient documentation

## 2022-05-05 DIAGNOSIS — Z79891 Long term (current) use of opiate analgesic: Secondary | ICD-10-CM | POA: Diagnosis not present

## 2022-05-05 DIAGNOSIS — M47816 Spondylosis without myelopathy or radiculopathy, lumbar region: Secondary | ICD-10-CM | POA: Insufficient documentation

## 2022-05-05 DIAGNOSIS — M170 Bilateral primary osteoarthritis of knee: Secondary | ICD-10-CM | POA: Diagnosis present

## 2022-05-05 MED ORDER — GABAPENTIN 300 MG PO CAPS: 600.0000 mg | ORAL_CAPSULE | Freq: Three times a day (TID) | ORAL | 2 refills | Status: AC
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
## 2022-05-05 NOTE — Telephone Encounter (Signed)
Pt called stating she has been waiting on a referral to neurologist that Dr. Otelia Sergeant says he was going to refer her too. I see nothing in chart stating neurology referral. Please advice.

## 2022-05-10 LAB — TOXASSURE SELECT,+ANTIDEPR,UR

## 2022-05-12 ENCOUNTER — Telehealth: Payer: Self-pay | Admitting: Physical Medicine & Rehabilitation

## 2022-05-12 NOTE — Telephone Encounter (Signed)
Patient does not want to be on a pain contract with [place].  She is going to follow up with her orthopedic doctor.

## 2022-05-13 NOTE — Telephone Encounter (Signed)
Contract cancelled.

## 2022-05-16 ENCOUNTER — Telehealth: Payer: Self-pay | Admitting: *Deleted

## 2022-05-16 NOTE — Telephone Encounter (Signed)
Urine drug screen is consistent for having no controlled substances or alcohol.

## 2022-05-28 ENCOUNTER — Ambulatory Visit (INDEPENDENT_AMBULATORY_CARE_PROVIDER_SITE_OTHER): Payer: 59 | Admitting: Primary Care

## 2022-06-17 ENCOUNTER — Other Ambulatory Visit: Payer: Self-pay | Admitting: Radiology

## 2022-06-17 ENCOUNTER — Telehealth: Payer: Self-pay | Admitting: Specialist

## 2022-06-17 MED ORDER — DICLOFENAC SODIUM 2 % EX SOLN: 2.0000 | Freq: Two times a day (BID) | CUTANEOUS | 6 refills | Status: DC | PRN

## 2022-06-17 NOTE — Telephone Encounter (Signed)
Pt called stating Dr. Otelia Sergeant has prescribe a spray for pain. She can't remember name of medication and spray is not list on her medication list. Please call pt about this matter at [phone].

## 2022-06-17 NOTE — Telephone Encounter (Signed)
I called and she wants Pennsaid---I sent request to Dr. Otelia Sergeant, I advised that I did not know if he would refill it or not, but that he is out of the office till next week and that it will have to wait till then for him to address it.

## 2022-07-15 ENCOUNTER — Telehealth: Payer: Self-pay | Admitting: Neurology

## 2022-07-15 ENCOUNTER — Encounter: Payer: Self-pay | Admitting: Neurology

## 2022-07-15 ENCOUNTER — Ambulatory Visit (INDEPENDENT_AMBULATORY_CARE_PROVIDER_SITE_OTHER): Payer: Commercial Managed Care - HMO | Admitting: Neurology

## 2022-07-15 VITALS — BP 151/82 | HR 66 | Ht 60.0 in | Wt 329.0 lb

## 2022-07-15 DIAGNOSIS — G8929 Other chronic pain: Secondary | ICD-10-CM

## 2022-07-15 DIAGNOSIS — Q0700 Arnold-Chiari syndrome without spina bifida or hydrocephalus: Secondary | ICD-10-CM

## 2022-07-15 DIAGNOSIS — R519 Headache, unspecified: Secondary | ICD-10-CM

## 2022-07-15 MED ORDER — RIZATRIPTAN BENZOATE 10 MG PO TBDP: 10.0000 mg | ORAL_TABLET | ORAL | 11 refills | Status: DC | PRN

## 2022-07-15 MED ORDER — TOPIRAMATE 100 MG PO TABS: 100.0000 mg | ORAL_TABLET | Freq: Two times a day (BID) | ORAL | 11 refills | Status: DC

## 2022-07-15 NOTE — Telephone Encounter (Signed)
Patient told check-out she wants to go under general anesthesia for this MRI. Please update the MRI.
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
## 2022-07-21 NOTE — Telephone Encounter (Signed)
Vanessa [name]: [id number] exp. 07/21/22-01/17/23 sent to [facility] for general anesthesia

## 2022-07-22 ENCOUNTER — Ambulatory Visit: Payer: 59 | Admitting: Internal Medicine
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
## 2022-07-24 ENCOUNTER — Ambulatory Visit: Payer: Commercial Managed Care - HMO | Admitting: Allergy

## 2022-07-28 ENCOUNTER — Telehealth: Payer: Self-pay | Admitting: Neurology

## 2022-07-28 ENCOUNTER — Other Ambulatory Visit: Payer: Self-pay

## 2022-07-28 ENCOUNTER — Encounter (HOSPITAL_COMMUNITY): Payer: Self-pay | Admitting: *Deleted

## 2022-07-28 NOTE — Progress Notes (Signed)
Spoke with pt for pre-op call. Pt has had 2 previous MRI's with anesthesia this year. She states she knows what she needs to do. Pt is diabetic. Last A1C was 6.4 on 01/28/22. Pt states she doesn't take her Toujeo until almost lunch time, she states she will take it after she gets home form the procedure. Instructed her to check her blood sugar when she wakes up in the AM. If blood sugar is 70 or below, treat with 1/2 cup of clear juice (apple or cranberry) and recheck blood sugar 15 minutes after drinking juice. If blood sugar continues to be 70 or below, call the Short Stay department and ask to speak to a nurse. Pt voiced understanding.  Reviewed shower instructions with her.

## 2022-07-28 NOTE — Telephone Encounter (Signed)
Medication viewable in epic, nothing further needed on this.

## 2022-07-28 NOTE — Telephone Encounter (Signed)
Pt said, [facility] a requesting medication list for MRI scheduled 07/29/22; due to pt being sedated during MRI. Would like a call from the nurse.

## 2022-07-29 ENCOUNTER — Ambulatory Visit (HOSPITAL_COMMUNITY)
Admission: RE | Admit: 2022-07-29 | Discharge: 2022-07-29 | Disposition: A | Discharge status: Home / Self Care | Payer: Commercial Managed Care - HMO | Source: Ambulatory Visit | Attending: Neurology | Admitting: Neurology

## 2022-07-29 ENCOUNTER — Telehealth: Payer: Self-pay

## 2022-07-29 ENCOUNTER — Encounter (HOSPITAL_COMMUNITY): Admission: RE | Disposition: A | Discharge status: Home / Self Care | Payer: Self-pay | Source: Ambulatory Visit

## 2022-07-29 ENCOUNTER — Ambulatory Visit (HOSPITAL_BASED_OUTPATIENT_CLINIC_OR_DEPARTMENT_OTHER): Payer: Commercial Managed Care - HMO | Admitting: Certified Registered Nurse Anesthetist

## 2022-07-29 ENCOUNTER — Ambulatory Visit (HOSPITAL_COMMUNITY): Payer: Commercial Managed Care - HMO | Admitting: Certified Registered Nurse Anesthetist

## 2022-07-29 ENCOUNTER — Encounter (HOSPITAL_COMMUNITY): Payer: Self-pay

## 2022-07-29 DIAGNOSIS — Z794 Long term (current) use of insulin: Secondary | ICD-10-CM | POA: Diagnosis not present

## 2022-07-29 DIAGNOSIS — R519 Headache, unspecified: Secondary | ICD-10-CM | POA: Insufficient documentation

## 2022-07-29 DIAGNOSIS — E119 Type 2 diabetes mellitus without complications: Secondary | ICD-10-CM | POA: Diagnosis not present

## 2022-07-29 DIAGNOSIS — G4730 Sleep apnea, unspecified: Secondary | ICD-10-CM | POA: Insufficient documentation

## 2022-07-29 DIAGNOSIS — Z6841 Body Mass Index (BMI) 40.0 and over, adult: Secondary | ICD-10-CM | POA: Insufficient documentation

## 2022-07-29 DIAGNOSIS — E039 Hypothyroidism, unspecified: Secondary | ICD-10-CM | POA: Insufficient documentation

## 2022-07-29 DIAGNOSIS — G935 Compression of brain: Secondary | ICD-10-CM | POA: Insufficient documentation

## 2022-07-29 DIAGNOSIS — M25561 Pain in right knee: Secondary | ICD-10-CM | POA: Diagnosis not present

## 2022-07-29 DIAGNOSIS — D649 Anemia, unspecified: Secondary | ICD-10-CM | POA: Diagnosis not present

## 2022-07-29 DIAGNOSIS — I10 Essential (primary) hypertension: Secondary | ICD-10-CM

## 2022-07-29 DIAGNOSIS — J322 Chronic ethmoidal sinusitis: Secondary | ICD-10-CM | POA: Diagnosis not present

## 2022-07-29 DIAGNOSIS — J45909 Unspecified asthma, uncomplicated: Secondary | ICD-10-CM | POA: Insufficient documentation

## 2022-07-29 DIAGNOSIS — Z01818 Encounter for other preprocedural examination: Secondary | ICD-10-CM

## 2022-07-29 DIAGNOSIS — F32A Depression, unspecified: Secondary | ICD-10-CM | POA: Insufficient documentation

## 2022-07-29 DIAGNOSIS — R269 Unspecified abnormalities of gait and mobility: Secondary | ICD-10-CM | POA: Diagnosis not present

## 2022-07-29 DIAGNOSIS — M542 Cervicalgia: Secondary | ICD-10-CM | POA: Diagnosis not present

## 2022-07-29 DIAGNOSIS — F419 Anxiety disorder, unspecified: Secondary | ICD-10-CM | POA: Insufficient documentation

## 2022-07-29 DIAGNOSIS — Q0700 Arnold-Chiari syndrome without spina bifida or hydrocephalus: Secondary | ICD-10-CM

## 2022-07-29 DIAGNOSIS — M25562 Pain in left knee: Secondary | ICD-10-CM | POA: Insufficient documentation

## 2022-07-29 DIAGNOSIS — G8929 Other chronic pain: Secondary | ICD-10-CM

## 2022-07-29 HISTORY — PX: RADIOLOGY WITH ANESTHESIA: SHX6223

## 2022-07-29 LAB — BASIC METABOLIC PANEL
Anion gap: 5 (ref 5–15)
BUN: 15 mg/dL (ref 6–20)
CO2: 26 mmol/L (ref 22–32)
Calcium: 8.7 mg/dL — ABNORMAL LOW (ref 8.9–10.3)
Chloride: 104 mmol/L (ref 98–111)
Creatinine, Ser: 0.95 mg/dL (ref 0.44–1.00)
GFR, Estimated: >60 mL/min (ref >60)
Glucose, Bld: 111 mg/dL — ABNORMAL HIGH (ref 70–99)
Potassium: 4.3 mmol/L (ref 3.5–5.1)
Sodium: 135 mmol/L (ref 135–145)

## 2022-07-29 LAB — GLUCOSE, CAPILLARY
Glucose-Capillary: 111 mg/dL — ABNORMAL HIGH (ref 70–99)
Glucose-Capillary: 105 mg/dL — ABNORMAL HIGH (ref 70–99)

## 2022-07-29 SURGERY — MRI WITH ANESTHESIA
Anesthesia: General

## 2022-07-29 MED ORDER — INSULIN ASPART 100 UNIT/ML IJ SOLN: 0.0000 [IU] | INTRAMUSCULAR | Status: DC | PRN

## 2022-07-29 MED ORDER — ACETAMINOPHEN 10 MG/ML IV SOLN
INTRAVENOUS | Status: AC
  Filled 2022-07-29: qty 100

## 2022-07-29 MED ORDER — SUGAMMADEX SODIUM 200 MG/2ML IV SOLN
INTRAVENOUS | Status: DC | PRN
  Administered 2022-07-29: 400 mg via INTRAVENOUS

## 2022-07-29 MED ORDER — ACETAMINOPHEN 500 MG PO TABS: 1000.0000 mg | ORAL_TABLET | Freq: Once | ORAL | Status: DC | PRN

## 2022-07-29 MED ORDER — MIDAZOLAM HCL 2 MG/2ML IJ SOLN
INTRAMUSCULAR | Status: DC | PRN
  Administered 2022-07-29: 2 mg via INTRAVENOUS

## 2022-07-29 MED ORDER — LIDOCAINE 2% (20 MG/ML) 5 ML SYRINGE
INTRAMUSCULAR | Status: DC | PRN
  Administered 2022-07-29: 80 mg via INTRAVENOUS

## 2022-07-29 MED ORDER — PHENYLEPHRINE HCL-NACL 20-0.9 MG/250ML-% IV SOLN
INTRAVENOUS | Status: DC | PRN
  Administered 2022-07-29: 25 ug/min via INTRAVENOUS

## 2022-07-29 MED ORDER — PROPOFOL 10 MG/ML IV BOLUS
INTRAVENOUS | Status: DC | PRN
  Administered 2022-07-29: 200 mg via INTRAVENOUS

## 2022-07-29 MED ORDER — CHLORHEXIDINE GLUCONATE 0.12 % MT SOLN
15.0000 mL | Freq: Once | OROMUCOSAL | Status: DC
  Filled 2022-07-29: qty 15

## 2022-07-29 MED ORDER — ROCURONIUM BROMIDE 10 MG/ML (PF) SYRINGE
PREFILLED_SYRINGE | INTRAVENOUS | Status: DC | PRN
  Administered 2022-07-29: 80 mg via INTRAVENOUS

## 2022-07-29 MED ORDER — DEXAMETHASONE SODIUM PHOSPHATE 10 MG/ML IJ SOLN
INTRAMUSCULAR | Status: DC | PRN
  Administered 2022-07-29: 10 mg via INTRAVENOUS

## 2022-07-29 MED ORDER — GADOPICLENOL 0.5 MMOL/ML IV SOLN
10.0000 mL | Freq: Once | INTRAVENOUS | Status: AC | PRN
  Administered 2022-07-29: 10 mL via INTRAVENOUS

## 2022-07-29 MED ORDER — FENTANYL CITRATE (PF) 100 MCG/2ML IJ SOLN
INTRAMUSCULAR | Status: DC | PRN
  Administered 2022-07-29: 100 ug via INTRAVENOUS

## 2022-07-29 MED ORDER — LACTATED RINGERS IV SOLN: INTRAVENOUS | Status: DC

## 2022-07-29 MED ORDER — ACETAMINOPHEN 160 MG/5ML PO SOLN: 1000.0000 mg | Freq: Once | ORAL | Status: DC | PRN

## 2022-07-29 MED ORDER — ACETAMINOPHEN 10 MG/ML IV SOLN
1000.0000 mg | Freq: Once | INTRAVENOUS | Status: DC | PRN
  Administered 2022-07-29: 1000 mg via INTRAVENOUS

## 2022-07-29 MED ORDER — ORAL CARE MOUTH RINSE: 15.0000 mL | Freq: Once | OROMUCOSAL | Status: DC

## 2022-07-29 MED ORDER — ONDANSETRON HCL 4 MG/2ML IJ SOLN
INTRAMUSCULAR | Status: DC | PRN
  Administered 2022-07-29: 4 mg via INTRAVENOUS

## 2022-07-29 NOTE — Anesthesia Procedure Notes (Signed)
Procedure Name: Intubation Date/Time: 07/29/2022 8:32 AM  Performed by: Harden Mo, CRNAPre-anesthesia Checklist: Patient identified, Emergency Drugs available, Suction available and Patient being monitored Patient Re-evaluated:Patient Re-evaluated prior to induction Oxygen Delivery Method: Circle System Utilized Preoxygenation: Pre-oxygenation with 100% oxygen Induction Type: IV induction Ventilation: Mask ventilation without difficulty Laryngoscope Size: Mac and 3 Grade View: Grade I Tube type: Oral Tube size: 7.0 mm Number of attempts: 1 Airway Equipment and Method: Stylet and Oral airway Placement Confirmation: ETT inserted through vocal cords under direct vision, positive ETCO2 and breath sounds checked- equal and bilateral Secured at: 22 cm Tube secured with: Tape Dental Injury: Teeth and Oropharynx as per pre-operative assessment

## 2022-07-29 NOTE — Telephone Encounter (Signed)
Patient LVM in regards to rescheduling missed appt. Do you guys mind r/s her appt please? Thank you

## 2022-07-29 NOTE — Transfer of Care (Signed)
Immediate Anesthesia Transfer of Care Note  Patient: Vanessa [name]  Procedure(s) Performed: MRI OF BRAIN WITH AND WITHOUT CONTRAST  Patient Location: PACU  Anesthesia Type:General  Level of Consciousness: awake, alert  and oriented  Airway & Oxygen Therapy: Patient Spontanous Breathing  Post-op Assessment: Report given to RN, Post -op Vital signs reviewed and stable and Patient moving all extremities X 4  Post vital signs: Reviewed and stable  Last Vitals:  Vitals Value Taken Time  BP 118/93   Temp    Pulse 68 07/29/22 0930  Resp 18 07/29/22 0930  SpO2 91 % 07/29/22 0930  Vitals shown include unvalidated device data.  Last Pain:  Vitals:   07/29/22 0714  TempSrc:   PainSc: 7       Patients Stated Pain Goal: 3 (61/95/09 3267)  Complications: No notable events documented.

## 2022-07-29 NOTE — Anesthesia Preprocedure Evaluation (Signed)
Anesthesia Evaluation  Patient identified by MRN, date of birth, ID band Patient awake    Reviewed: Allergy & Precautions, NPO status , Patient's Chart, lab work & pertinent test results  History of Anesthesia Complications Negative for: history of anesthetic complications  Airway Mallampati: II  TM Distance: >3 FB Neck ROM: Full    Dental  (+) Dental Advisory Given, Teeth Intact   Pulmonary asthma , sleep apnea ,    Pulmonary exam normal        Cardiovascular hypertension, Pt. on medications (-) angina(-) Past MI and (-) CHF Normal cardiovascular exam     Neuro/Psych  Headaches, PSYCHIATRIC DISORDERS Anxiety Depression  Neuromuscular disease    GI/Hepatic negative GI ROS, Neg liver ROS,   Endo/Other  diabetes, Type 2, Insulin DependentHypothyroidism Morbid obesityLab Results      Component                Value               Date                      HGBA1C                   6.4 (A)             01/28/2022             Renal/GU negative Renal ROS  negative genitourinary   Musculoskeletal negative musculoskeletal ROS (+)   Abdominal   Peds  Hematology  (+) Blood dyscrasia, anemia , Lab Results      Component                Value               Date                      WBC                      9.9                 09/20/2021                HGB                      11.6 (L)            09/20/2021                HCT                      36.2                09/20/2021                MCV                      76.5 (L)            09/20/2021                PLT                      375.0               09/20/2021              Anesthesia Other Findings   Reproductive/Obstetrics  Anesthesia Physical Anesthesia Plan  ASA: 3  Anesthesia Plan: General   Post-op Pain Management: Minimal or no pain anticipated   Induction: Intravenous  PONV Risk Score and Plan: 3 and  Ondansetron and Dexamethasone  Airway Management Planned: Oral ETT  Additional Equipment: None  Intra-op Plan:   Post-operative Plan: Extubation in OR  Informed Consent: I have reviewed the patients History and Physical, chart, labs and discussed the procedure including the risks, benefits and alternatives for the proposed anesthesia with the patient or authorized representative who has indicated his/her understanding and acceptance.     Dental advisory given  Plan Discussed with: CRNA  Anesthesia Plan Comments:         Anesthesia Quick Evaluation

## 2022-07-30 ENCOUNTER — Telehealth: Payer: Self-pay | Admitting: Neurology

## 2022-07-30 ENCOUNTER — Encounter (HOSPITAL_COMMUNITY): Payer: Self-pay | Admitting: Radiology

## 2022-07-30 NOTE — Anesthesia Postprocedure Evaluation (Signed)
Anesthesia Post Note  Patient: Vanessa [name]  Procedure(s) Performed: MRI OF BRAIN WITH AND WITHOUT CONTRAST     Patient location during evaluation: PACU Anesthesia Type: General Level of consciousness: awake and alert Pain management: pain level controlled Vital Signs Assessment: post-procedure vital signs reviewed and stable Respiratory status: spontaneous breathing, nonlabored ventilation, respiratory function stable and patient connected to nasal cannula oxygen Cardiovascular status: blood pressure returned to baseline and stable Postop Assessment: no apparent nausea or vomiting Anesthetic complications: no   No notable events documented.  Last Vitals:  Vitals:   07/29/22 0945 07/29/22 1000  BP: 101/84 101/84  Pulse: (!) 55 62  Resp: 13 15  Temp:  36.7 C  SpO2: 95% 95%    Last Pain:  Vitals:   07/29/22 1000  TempSrc:   PainSc: 3    Pain Goal: Patients Stated Pain Goal: 3 (07/29/22 0714)                 Lior Cartelli

## 2022-07-30 NOTE — Telephone Encounter (Signed)
I called pt and relayed message. She verbalized understanding and appreciation for the call. F/u made for 10/15/2022 with Hedwig Morton, NP for 830 am

## 2022-07-30 NOTE — Telephone Encounter (Signed)
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  7 mm nonenhancing or hypoenhancing lesion was noted at the mid to posterior aspect of the pituitary gland, the imaging feature most suggestive of a cyst.  Gave her a follow-up visit with nurse practitioner in 3 to 4 months, may check. 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 As before, the cerebellar tonsils extend 10-11 mm below the level foramen magnum. 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 Alternatively, cerebellar tonsillar ectopia can be seen in the setting of intracranial hypotension or idiopathic intracranial hypertension (pseudotumor cerebri). Associated crowding at the level of foramen magnum.   7 mm non-enhancing or hypoenhancing lesion within the mid-to-posterior aspect of the pituitary gland. The imaging features are most suggestive of a cyst. However, a pituitary microadenoma is difficult to definitively exclude.No further imaging evaluation or imaging follow-up is necessary. Consider endocrine function tests and correlate for history of pituitary hypersecretion. This follows ACR consensus guidelines: Management of Incidental Pituitary Findings on CT, MRI and F18-FDG PET: A White Paper of the ACR Incidental Findings Committee. J Am Coll Radiol 2018; 15: 259-56.   Few nonspecific punctate T2 FLAIR hyperintense remote insults scattered within the bilateral cerebral white matter.   Mild left ethmoid sinusitis.

## 2022-08-03 NOTE — Telephone Encounter (Signed)
error 

## 2022-08-25 ENCOUNTER — Telehealth: Payer: Self-pay | Admitting: Specialist

## 2022-08-25 NOTE — Telephone Encounter (Signed)
Printed and gave to Nitka 

## 2022-08-25 NOTE — Telephone Encounter (Signed)
Patient called asked if Dr.Nitka would call her in some pain patches (Lidocaine patches) Patient said her right foot is swollen real bad and she can not get around. Patient said she is confined to her room. The number to contact patient is [phone]

## 2022-08-27 ENCOUNTER — Telehealth: Payer: Self-pay | Admitting: Specialist

## 2022-08-27 NOTE — Telephone Encounter (Signed)
See note from 10/23 still pending response from Dr Louanne Skye

## 2022-08-27 NOTE — Telephone Encounter (Signed)
Pt called requesting an update about patches. Please send patches to pharmacy on file. Pt states she hasn't been to get out of bed other then to go to bathroom. Haven't been able to eat due to pain. Please call pt about this matter at [phone]

## 2022-08-28 ENCOUNTER — Telehealth: Payer: Self-pay | Admitting: Internal Medicine

## 2022-08-28 NOTE — Telephone Encounter (Signed)
Caller & Relationship to patient: PT  Call back number: [phone]   Date of last office visit: 02/10/22  Date of next office visit: N/A  Medication(s) to be refilled:   cyclobenzaprine (FLEXERIL) 10 MG tablet     Preferred Pharmacy:   [facility] [address], [place]

## 2022-08-28 NOTE — Telephone Encounter (Signed)
I called and advised on James' response, I offered her multiple appts with Dr. Rolena Infante, but none of them would work due to her Daughters work schedule. I advised that if she decides to she can call the office back and make an appointment.

## 2022-08-29 ENCOUNTER — Other Ambulatory Visit: Payer: Self-pay | Admitting: Internal Medicine

## 2022-08-29 DIAGNOSIS — I10 Essential (primary) hypertension: Secondary | ICD-10-CM

## 2022-09-01 ENCOUNTER — Encounter (INDEPENDENT_AMBULATORY_CARE_PROVIDER_SITE_OTHER): Payer: Self-pay

## 2022-09-01 MED ORDER — CYCLOBENZAPRINE HCL 10 MG PO TABS: ORAL_TABLET | ORAL | 3 refills | Status: DC

## 2022-09-01 NOTE — Telephone Encounter (Signed)
Refill sent to pharmacy, patient informed.

## 2022-09-03 ENCOUNTER — Other Ambulatory Visit: Payer: Self-pay

## 2022-09-03 MED ORDER — LEVOTHYROXINE SODIUM 200 MCG PO TABS: ORAL_TABLET | ORAL | 0 refills | Status: DC

## 2022-10-14 ENCOUNTER — Ambulatory Visit (INDEPENDENT_AMBULATORY_CARE_PROVIDER_SITE_OTHER): Payer: Commercial Managed Care - HMO | Admitting: Internal Medicine

## 2022-10-14 ENCOUNTER — Encounter: Payer: Self-pay | Admitting: Internal Medicine

## 2022-10-14 VITALS — BP 144/92 | HR 59 | Ht 65.0 in | Wt 339.4 lb

## 2022-10-14 DIAGNOSIS — E785 Hyperlipidemia, unspecified: Secondary | ICD-10-CM | POA: Diagnosis not present

## 2022-10-14 DIAGNOSIS — E1169 Type 2 diabetes mellitus with other specified complication: Secondary | ICD-10-CM

## 2022-10-14 DIAGNOSIS — E039 Hypothyroidism, unspecified: Secondary | ICD-10-CM | POA: Diagnosis not present

## 2022-10-14 DIAGNOSIS — E1165 Type 2 diabetes mellitus with hyperglycemia: Secondary | ICD-10-CM

## 2022-10-14 LAB — POCT GLYCOSYLATED HEMOGLOBIN (HGB A1C): Hemoglobin A1C: 6 % — AB (ref 4.0–5.6)

## 2022-10-14 NOTE — Patient Instructions (Addendum)
Please continue: - Toujeo 62 units daily in am  Please continue Levothyroxine 200 mcg daily.  Take the thyroid hormone every day, with water, at least 30 minutes before breakfast, separated by at least 4 hours from: - acid reflux medications - calcium - iron - multivitamins  Please stop at the lab.  Please return in 6 months.
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
## 2022-10-15 ENCOUNTER — Ambulatory Visit: Payer: Commercial Managed Care - HMO | Admitting: Adult Health

## 2022-10-15 ENCOUNTER — Telehealth: Payer: Self-pay

## 2022-10-15 LAB — LIPID PANEL
Cholesterol: 224 mg/dL — ABNORMAL HIGH (ref 0–200)
HDL: 72.4 mg/dL (ref >39.00)
LDL Cholesterol: 120 mg/dL — ABNORMAL HIGH (ref 0–99)
NonHDL: 151.46
Total CHOL/HDL Ratio: 3
Triglycerides: 156 mg/dL — ABNORMAL HIGH (ref 0.0–149.0)
VLDL: 31.2 mg/dL (ref 0.0–40.0)

## 2022-10-15 LAB — T4, FREE: Free T4: 0.41 ng/dL — ABNORMAL LOW (ref 0.60–1.60)

## 2022-10-15 LAB — TSH: TSH: 69.4 u[IU]/mL — ABNORMAL HIGH (ref 0.35–5.50)

## 2022-10-15 NOTE — Telephone Encounter (Signed)
Pt contacted and advised Dr Elvera Lennox reviewed her lab results and says Your lipid levels are abnormal, however, your thyroid tests are much worse than before, and this can be related.  It appears that you are missing more levothyroxine doses than you think.  Please move the levothyroxine on the nightstand and put it on your phone.  You absolutely need to take this every morning and not miss any dose.  Let's repeat the thyroid tests in 1.5 months. Please call our main office number [phone]) to schedule a lab appointment. Pt's lab appt scheduled.

## 2022-10-15 NOTE — Telephone Encounter (Signed)
Pt called to discuss results.

## 2022-10-31 ENCOUNTER — Telehealth: Payer: Self-pay | Admitting: Internal Medicine

## 2022-10-31 MED ORDER — ALBUTEROL SULFATE HFA 108 (90 BASE) MCG/ACT IN AERS: 2.0000 | INHALATION_SPRAY | Freq: Four times a day (QID) | RESPIRATORY_TRACT | 2 refills | Status: DC | PRN

## 2022-10-31 NOTE — Telephone Encounter (Signed)
Done erx 

## 2022-10-31 NOTE — Telephone Encounter (Signed)
Caller & Relationship to patient: Self   Call back number: [phone]   Date of last office visit: 4.10.23  Date of next office visit: N/A  Medication(s) to be refilled:  albuterol (VENTOLIN HFA) 108 (90 Base) MCG/ACT inhaler   Preferred Pharmacy:   [facility] [phone]   Phone: [phone]  Fax: [phone]

## 2022-11-04 ENCOUNTER — Telehealth: Payer: Self-pay | Admitting: Internal Medicine

## 2022-11-04 NOTE — Telephone Encounter (Signed)
Caller & Relationship to patient: Vanessa [name] - patient  Call back number: [phone]  Date of last office visit: 02-10-22  Date of next office visit: N/A  Medication(s) to be refilled: beclomethasone (QVAR) 80 MCG/ACT inhaler   Preferred Pharmacy: [facility] [place], [place]

## 2022-11-05 ENCOUNTER — Other Ambulatory Visit: Payer: Self-pay

## 2022-11-05 MED ORDER — QVAR REDIHALER 80 MCG/ACT IN AERB: 1.0000 | INHALATION_SPRAY | Freq: Two times a day (BID) | RESPIRATORY_TRACT | 2 refills | Status: DC

## 2022-11-05 NOTE — Telephone Encounter (Signed)
Please send in new Rx for Beclomethasone

## 2022-11-05 NOTE — Telephone Encounter (Signed)
Done erx 

## 2022-11-10 ENCOUNTER — Telehealth: Payer: Self-pay

## 2022-11-10 NOTE — Telephone Encounter (Signed)
Pt PA for Aerosol is send to plan, waiting for approval  (Key: [id number])

## 2022-11-13 ENCOUNTER — Ambulatory Visit: Payer: Commercial Managed Care - HMO | Admitting: Adult Health

## 2022-11-13 ENCOUNTER — Encounter: Payer: Self-pay | Admitting: Adult Health

## 2022-11-13 VITALS — BP 147/85 | HR 66 | Ht 62.0 in | Wt 340.8 lb

## 2022-11-13 DIAGNOSIS — G4733 Obstructive sleep apnea (adult) (pediatric): Secondary | ICD-10-CM | POA: Diagnosis not present

## 2022-11-13 DIAGNOSIS — Q0700 Arnold-Chiari syndrome without spina bifida or hydrocephalus: Secondary | ICD-10-CM | POA: Diagnosis not present

## 2022-11-13 DIAGNOSIS — E236 Other disorders of pituitary gland: Secondary | ICD-10-CM | POA: Diagnosis not present

## 2022-11-13 DIAGNOSIS — R519 Headache, unspecified: Secondary | ICD-10-CM

## 2022-11-13 DIAGNOSIS — G8929 Other chronic pain: Secondary | ICD-10-CM

## 2022-11-13 NOTE — Telephone Encounter (Signed)
Pharmacy Patient Advocate Encounter Received notification from [place] that the request for prior authorization for Qvar 80 mcg/act has been denied due to .    You may call [phone] or fax  [phone], to appeal.  Please be advised we currently do not have a Pharmacist to review denials. If you would like [place] to submit it on your behalf, please provide clinical information to support your reason for appeal and any pertinent information you would like [place] to include with the appeal request. Appeals may take longer 5 business days to be submitted as we prepares necessary documentation. Thanks for your support.  How would you like to proceed?

## 2022-11-13 NOTE — Progress Notes (Signed)
f °
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
## 2022-11-14 MED ORDER — FLUTICASONE PROPIONATE HFA 110 MCG/ACT IN AERO: 2.0000 | INHALATION_SPRAY | Freq: Two times a day (BID) | RESPIRATORY_TRACT | 3 refills | Status: DC

## 2022-11-14 NOTE — Telephone Encounter (Signed)
Qvar changed to flovent HFA

## 2022-11-14 NOTE — Telephone Encounter (Signed)
Notified pt MD sent flovent to [facility].Marland KitchenJohny Chess

## 2022-11-14 NOTE — Addendum Note (Signed)
Addended by: Biagio Borg on: 11/14/2022 09:57 AM   Modules accepted: Orders

## 2022-11-15 LAB — PROLACTIN: Prolactin: 6.3 ng/mL (ref 4.8–33.4)

## 2022-11-15 LAB — INSULIN-LIKE GROWTH FACTOR: Insulin-Like GF-1: 90 ng/mL (ref 70–225)

## 2022-11-15 LAB — GROWTH HORMONE: Growth Hormone: 1.2 ng/mL (ref 0.0–10.0)

## 2022-11-15 LAB — FOLLICLE STIMULATING HORMONE: FSH: 7.2 m[IU]/mL

## 2022-11-15 LAB — LUTEINIZING HORMONE: LH: 8.2 m[IU]/mL

## 2022-11-26 ENCOUNTER — Other Ambulatory Visit: Payer: Commercial Managed Care - HMO

## 2022-11-26 ENCOUNTER — Ambulatory Visit: Payer: Commercial Managed Care - HMO | Admitting: Internal Medicine

## 2022-12-03 ENCOUNTER — Telehealth: Payer: Self-pay | Admitting: Internal Medicine

## 2022-12-03 MED ORDER — ALBUTEROL SULFATE HFA 108 (90 BASE) MCG/ACT IN AERS: 2.0000 | INHALATION_SPRAY | Freq: Four times a day (QID) | RESPIRATORY_TRACT | 5 refills | Status: DC | PRN

## 2022-12-03 MED ORDER — GUAIFENESIN-DM 100-10 MG/5ML PO SYRP: 5.0000 mL | ORAL_SOLUTION | ORAL | 0 refills | Status: DC | PRN

## 2022-12-03 NOTE — Telephone Encounter (Signed)
Patient called states she has bronchitis and would like a cough syrup prescribed for her and she mentioned getting a refill on her inhaler as well.

## 2022-12-03 NOTE — Telephone Encounter (Signed)
Ok this is done 

## 2022-12-03 NOTE — Telephone Encounter (Signed)
Are you able to send in something for a cough and refill an inhaler for bronchitis or does this require an OV please advise

## 2022-12-09 ENCOUNTER — Ambulatory Visit: Payer: Commercial Managed Care - HMO | Admitting: Internal Medicine

## 2022-12-11 ENCOUNTER — Ambulatory Visit: Payer: Commercial Managed Care - HMO | Admitting: Internal Medicine

## 2022-12-15 ENCOUNTER — Other Ambulatory Visit (INDEPENDENT_AMBULATORY_CARE_PROVIDER_SITE_OTHER): Payer: Commercial Managed Care - HMO

## 2022-12-15 DIAGNOSIS — E039 Hypothyroidism, unspecified: Secondary | ICD-10-CM | POA: Diagnosis not present

## 2022-12-16 LAB — TSH: TSH: 35.38 u[IU]/mL — ABNORMAL HIGH (ref 0.35–5.50)

## 2022-12-16 LAB — T4, FREE: Free T4: 0.91 ng/dL (ref 0.60–1.60)

## 2022-12-17 ENCOUNTER — Encounter: Payer: Self-pay | Admitting: Internal Medicine

## 2022-12-17 NOTE — Progress Notes (Deleted)
Subjective:    Patient ID: Vanessa [name], female    DOB: 07-10-1971, 52 y.o.   MRN: [id number]      HPI Vanessa [name] is here for No chief complaint on file.   She is here for an acute visit for cold symptoms.   Her symptoms started   She is experiencing   She has tried taking       Medications and allergies reviewed with patient and updated if appropriate.  Current Outpatient Medications on File Prior to Visit  Medication Sig Dispense Refill   acetaminophen-codeine (TYLENOL #3) 300-30 MG tablet Take 1 tablet by mouth every 6 (six) hours as needed for moderate pain. 30 tablet 0   albuterol (VENTOLIN HFA) 108 (90 Base) MCG/ACT inhaler Inhale 2 puffs into the lungs every 6 (six) hours as needed for wheezing or shortness of breath. 8 g 5   ALPRAZolam (XANAX) 0.5 MG tablet      beclomethasone (QVAR REDIHALER) 80 MCG/ACT inhaler Inhale 1 puff into the lungs 2 (two) times daily. 1 each 2   beclomethasone (QVAR) 80 MCG/ACT inhaler INHALE 1 PUFF INTO THE LUNGS Daily (Patient taking differently: Inhale 1 puff into the lungs daily.) 8.7 g 11   blood glucose meter kit and supplies KIT Dispense based on patient and insurance preference. Use up to four times daily as directed. 1 each 0   cetirizine (ZYRTEC) 10 MG tablet      citalopram (CELEXA) 20 MG tablet TAKE 1 TABLET(20 MG) BY MOUTH DAILY 90 tablet 1   clonazePAM (KLONOPIN) 0.5 MG tablet Take 1 tablet (0.5 mg total) by mouth 2 (two) times daily as needed for anxiety. 60 tablet 1   Continuous Blood Gluc Sensor (FREESTYLE LIBRE 2 SENSOR) MISC 1 Device by Does not apply route every 14 (fourteen) days. 6 each 3   diclofenac (VOLTAREN) 50 MG EC tablet      diclofenac Sodium (VOLTAREN) 1 % GEL      fluticasone (CUTIVATE) 0.05 % cream Apply 1 application. topically 2 (two) times daily as needed (irritation). Apply topically twice a day to irritation area 30 g 1   fluticasone (FLOVENT HFA) 110 MCG/ACT inhaler Inhale 2 puffs into the lungs in  the morning and at bedtime. 3 each 3   gabapentin (NEURONTIN) 300 MG capsule Take 2 capsules (600 mg total) by mouth 3 (three) times daily. 180 capsule 2   glucose blood (RELION PREMIER TEST) test strip Use as instructed once daily E11.9 100 each 12   guaiFENesin-dextromethorphan (ROBITUSSIN DM) 100-10 MG/5ML syrup Take 5 mLs by mouth every 4 (four) hours as needed for cough. 118 mL 0   hydrochlorothiazide (HYDRODIURIL) 25 MG tablet TAKE 1 TABLET(25 MG) BY MOUTH DAILY 90 tablet 1   HYDROcodone bit-homatropine (HYCODAN) 5-1.5 MG/5ML syrup      HYDROcodone-acetaminophen (NORCO/VICODIN) 5-325 MG tablet Take 1 tablet by mouth every 6 (six) hours as needed for moderate pain. 30 tablet 0   hydrocortisone (ANUSOL-HC) 2.5 % rectal cream Place 1 application. rectally 2 (two) times daily. (Patient taking differently: Place 1 application  rectally 2 (two) times daily as needed for hemorrhoids.) 30 g 1   ibuprofen (ADVIL) 600 MG tablet TAKE 1 TABLET BY MOUTH EVERY 6 HOURS AS NEEDED FOR 10 DAYS     insulin glargine, 2 Unit Dial, (TOUJEO MAX SOLOSTAR) 300 UNIT/ML Solostar Pen Inject 62 Units into the skin every morning. And pen needles 1/day. 24 mL 3   levothyroxine (SYNTHROID) 175 MCG tablet  losartan (COZAAR) 50 MG tablet Take 50 mg by mouth daily.     montelukast (SINGULAIR) 10 MG tablet Take 1 tablet (10 mg total) by mouth daily. 90 tablet 3   naproxen (NAPROSYN) 500 MG tablet      ReliOn Ultra Thin Lancets 30G MISC Use as directed once daily E11.9 100 each 11   rizatriptan (MAXALT-MLT) 10 MG disintegrating tablet Take 1 tablet (10 mg total) by mouth as needed for migraine. May repeat in 2 hours if needed 10 tablet 11   SUMAtriptan (IMITREX) 100 MG tablet TAKE 1 TABLET BY MOUTH AS NEEDED FOR MIGRAINE OR HEADACHE.* MAY REPEAT IN 2 HOURS IF NEEDED 10 tablet 11   tiZANidine (ZANAFLEX) 4 MG tablet      topiramate (TOPAMAX) 100 MG tablet Take 1 tablet (100 mg total) by mouth 2 (two) times daily. 60 tablet 11    traZODone (DESYREL) 100 MG tablet 1/2 - 1 tab by mouth at bedtime as needed for sleep (Patient taking differently: Take 50 mg by mouth at bedtime as needed for sleep.) 90 tablet 1   triamcinolone (NASACORT) 55 MCG/ACT AERO nasal inhaler Place 2 sprays into the nose daily. (Patient taking differently: Place 2 sprays into the nose daily as needed (allergies).) 1 Inhaler 12   triamcinolone cream (KENALOG) 0.1 % Apply 1 application. topically 2 (two) times daily. (Patient taking differently: Apply 1 application  topically 2 (two) times daily as needed (irritation).) 30 g 1   No current facility-administered medications on file prior to visit.    Review of Systems     Objective:  There were no vitals filed for this visit. BP Readings from Last 3 Encounters:  11/13/22 (!) 147/85  10/14/22 (!) 144/92  07/29/22 101/84   Wt Readings from Last 3 Encounters:  11/13/22 (!) 340 lb 12.8 oz (154.6 kg)  10/14/22 (!) 339 lb 6.4 oz (154 kg)  07/29/22 300 lb (136.1 kg)   There is no height or weight on file to calculate BMI.    Physical Exam         Assessment & Plan:    See Problem List for Assessment and Plan of chronic medical problems.

## 2022-12-18 ENCOUNTER — Ambulatory Visit: Payer: Commercial Managed Care - HMO | Admitting: Internal Medicine

## 2022-12-31 ENCOUNTER — Telehealth: Payer: Self-pay | Admitting: Neurology

## 2022-12-31 NOTE — Telephone Encounter (Signed)
Unable to LVM, no VM box, sent mychart msg informing pt of need to reschedule 01/13/23 appointment - MD out

## 2023-01-12 ENCOUNTER — Institutional Professional Consult (permissible substitution): Payer: Commercial Managed Care - HMO | Admitting: Neurology

## 2023-01-13 ENCOUNTER — Institutional Professional Consult (permissible substitution): Payer: Commercial Managed Care - HMO | Admitting: Neurology

## 2023-01-25 ENCOUNTER — Other Ambulatory Visit: Payer: Self-pay | Admitting: Internal Medicine

## 2023-01-28 ENCOUNTER — Ambulatory Visit (INDEPENDENT_AMBULATORY_CARE_PROVIDER_SITE_OTHER): Payer: Commercial Managed Care - HMO | Admitting: Neurology

## 2023-01-28 ENCOUNTER — Encounter: Payer: Self-pay | Admitting: Neurology

## 2023-01-28 ENCOUNTER — Other Ambulatory Visit: Payer: Self-pay | Admitting: Internal Medicine

## 2023-01-28 VITALS — BP 133/88 | HR 80 | Ht 62.0 in | Wt 339.0 lb

## 2023-01-28 DIAGNOSIS — G4719 Other hypersomnia: Secondary | ICD-10-CM

## 2023-01-28 DIAGNOSIS — G4733 Obstructive sleep apnea (adult) (pediatric): Secondary | ICD-10-CM | POA: Diagnosis not present

## 2023-01-28 DIAGNOSIS — R351 Nocturia: Secondary | ICD-10-CM | POA: Diagnosis not present

## 2023-01-28 DIAGNOSIS — R519 Headache, unspecified: Secondary | ICD-10-CM | POA: Diagnosis not present

## 2023-01-28 DIAGNOSIS — Z6841 Body Mass Index (BMI) 40.0 and over, adult: Secondary | ICD-10-CM

## 2023-01-28 DIAGNOSIS — Z79899 Other long term (current) drug therapy: Secondary | ICD-10-CM

## 2023-01-28 DIAGNOSIS — Z79891 Long term (current) use of opiate analgesic: Secondary | ICD-10-CM
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
## 2023-01-28 NOTE — Patient Instructions (Addendum)

## 2023-01-29 ENCOUNTER — Telehealth: Payer: Self-pay | Admitting: Internal Medicine

## 2023-01-29 NOTE — Telephone Encounter (Signed)
LOV 02/10/22, this medication was last prescribed by an historical provider

## 2023-01-29 NOTE — Telephone Encounter (Signed)
I think pt was just seen and had thyroid labs with endo Dr Renne Crigler in feb 2024   Should be ok to ask pt to request refill from there, thanks

## 2023-01-29 NOTE — Telephone Encounter (Signed)
Prescription Request  01/29/2023  LOV: 02/10/2022  What is the name of the medication or equipment? levothyroxine (SYNTHROID) 175 MCG tablet   Have you contacted your pharmacy to request a refill? Yes   Which pharmacy would you like this sent to?  [facility] [phone] - [address], [address] - [address] [place] [address] Phone: [phone] Fax: [phone]    Patient notified that their request is being sent to the clinical staff for review and that they should receive a response within 2 business days.   Please advise at [place] [phone] (mobile)   Pt is out of medication.

## 2023-02-02 ENCOUNTER — Other Ambulatory Visit: Payer: Self-pay

## 2023-02-02 DIAGNOSIS — E039 Hypothyroidism, unspecified: Secondary | ICD-10-CM

## 2023-02-02 MED ORDER — LEVOTHYROXINE SODIUM 175 MCG PO TABS: 175.0000 ug | ORAL_TABLET | Freq: Every day | ORAL | 0 refills | Status: DC

## 2023-02-02 NOTE — Telephone Encounter (Signed)
Pt called requesting a refill on thyroid medication Levothyroxine 175 mcg previously prescribed by PCP.

## 2023-02-05 ENCOUNTER — Ambulatory Visit (INDEPENDENT_AMBULATORY_CARE_PROVIDER_SITE_OTHER): Payer: Medicaid Other | Admitting: Internal Medicine

## 2023-02-05 VITALS — BP 130/76 | HR 70 | Temp 97.9°F | Ht 62.0 in | Wt 339.0 lb

## 2023-02-05 DIAGNOSIS — J4541 Moderate persistent asthma with (acute) exacerbation: Secondary | ICD-10-CM | POA: Diagnosis not present

## 2023-02-05 DIAGNOSIS — Z0001 Encounter for general adult medical examination with abnormal findings: Secondary | ICD-10-CM | POA: Diagnosis not present

## 2023-02-05 DIAGNOSIS — I10 Essential (primary) hypertension: Secondary | ICD-10-CM | POA: Diagnosis not present

## 2023-02-05 DIAGNOSIS — E039 Hypothyroidism, unspecified: Secondary | ICD-10-CM | POA: Diagnosis not present

## 2023-02-05 DIAGNOSIS — E1169 Type 2 diabetes mellitus with other specified complication: Secondary | ICD-10-CM | POA: Diagnosis not present

## 2023-02-05 DIAGNOSIS — E785 Hyperlipidemia, unspecified: Secondary | ICD-10-CM | POA: Diagnosis not present

## 2023-02-05 DIAGNOSIS — H9202 Otalgia, left ear: Secondary | ICD-10-CM | POA: Diagnosis not present

## 2023-02-05 DIAGNOSIS — E559 Vitamin D deficiency, unspecified: Secondary | ICD-10-CM | POA: Diagnosis not present

## 2023-02-05 DIAGNOSIS — E1165 Type 2 diabetes mellitus with hyperglycemia: Secondary | ICD-10-CM

## 2023-02-05 DIAGNOSIS — E538 Deficiency of other specified B group vitamins: Secondary | ICD-10-CM

## 2023-02-05 LAB — URINALYSIS, ROUTINE W REFLEX MICROSCOPIC
Bilirubin Urine: NEGATIVE
Hgb urine dipstick: NEGATIVE
Ketones, ur: NEGATIVE
Leukocytes,Ua: NEGATIVE
Nitrite: NEGATIVE
Specific Gravity, Urine: 1.025 (ref 1.000–1.030)
Total Protein, Urine: NEGATIVE
Urine Glucose: NEGATIVE
Urobilinogen, UA: 0.2 (ref 0.0–1.0)
pH: 6 (ref 5.0–8.0)

## 2023-02-05 LAB — BASIC METABOLIC PANEL
BUN: 10 mg/dL (ref 6–23)
CO2: 28 mEq/L (ref 19–32)
Calcium: 8.5 mg/dL (ref 8.4–10.5)
Chloride: 107 mEq/L (ref 96–112)
Creatinine, Ser: 0.81 mg/dL (ref 0.40–1.20)
GFR: 84.28 mL/min (ref >60.00)
Glucose, Bld: 115 mg/dL — ABNORMAL HIGH (ref 70–99)
Potassium: 4.2 mEq/L (ref 3.5–5.1)
Sodium: 142 mEq/L (ref 135–145)

## 2023-02-05 LAB — CBC WITH DIFFERENTIAL/PLATELET
Basophils Absolute: 0 10*3/uL (ref 0.0–0.1)
Basophils Relative: 0.4 % (ref 0.0–3.0)
Eosinophils Absolute: 0.2 10*3/uL (ref 0.0–0.7)
Eosinophils Relative: 1.7 % (ref 0.0–5.0)
HCT: 39.6 % (ref 36.0–46.0)
Hemoglobin: 13 g/dL (ref 12.0–15.0)
Lymphocytes Relative: 35.5 % (ref 12.0–46.0)
Lymphs Abs: 3 10*3/uL (ref 0.7–4.0)
MCHC: 32.7 g/dL (ref 30.0–36.0)
MCV: 86.6 fl (ref 78.0–100.0)
Monocytes Absolute: 0.7 10*3/uL (ref 0.1–1.0)
Monocytes Relative: 7.7 % (ref 3.0–12.0)
Neutro Abs: 4.7 10*3/uL (ref 1.4–7.7)
Neutrophils Relative %: 54.7 % (ref 43.0–77.0)
Platelets: 269 10*3/uL (ref 150.0–400.0)
RBC: 4.58 Mil/uL (ref 3.87–5.11)
RDW: 17.4 % — ABNORMAL HIGH (ref 11.5–15.5)
WBC: 8.6 10*3/uL (ref 4.0–10.5)

## 2023-02-05 LAB — HEPATIC FUNCTION PANEL
ALT: 11 U/L (ref 0–35)
AST: 15 U/L (ref 0–37)
Albumin: 3.6 g/dL (ref 3.5–5.2)
Alkaline Phosphatase: 85 U/L (ref 39–117)
Bilirubin, Direct: 0.1 mg/dL (ref 0.0–0.3)
Total Bilirubin: 0.2 mg/dL (ref 0.2–1.2)
Total Protein: 7.9 g/dL (ref 6.0–8.3)

## 2023-02-05 LAB — TSH: TSH: 37.91 u[IU]/mL — ABNORMAL HIGH (ref 0.35–5.50)

## 2023-02-05 LAB — VITAMIN D 25 HYDROXY (VIT D DEFICIENCY, FRACTURES): VITD: 9.49 ng/mL — ABNORMAL LOW (ref 30.00–100.00)

## 2023-02-05 LAB — MICROALBUMIN / CREATININE URINE RATIO
Creatinine,U: 141.3 mg/dL
Microalb Creat Ratio: 0.5 mg/g (ref 0.0–30.0)
Microalb, Ur: 0.7 mg/dL (ref 0.0–1.9)

## 2023-02-05 LAB — LIPID PANEL
Cholesterol: 179 mg/dL (ref 0–200)
HDL: 61 mg/dL (ref >39.00)
LDL Cholesterol: 93 mg/dL (ref 0–99)
NonHDL: 118
Total CHOL/HDL Ratio: 3
Triglycerides: 123 mg/dL (ref 0.0–149.0)
VLDL: 24.6 mg/dL (ref 0.0–40.0)

## 2023-02-05 LAB — HEMOGLOBIN A1C: Hgb A1c MFr Bld: 6.5 % (ref 4.6–6.5)

## 2023-02-05 LAB — VITAMIN B12: Vitamin B-12: 373 pg/mL (ref 211–911)

## 2023-02-05 MED ORDER — ALBUTEROL SULFATE 0.63 MG/3ML IN NEBU: 1.0000 | INHALATION_SOLUTION | Freq: Four times a day (QID) | RESPIRATORY_TRACT | 12 refills | Status: AC | PRN

## 2023-02-05 MED ORDER — METHYLPREDNISOLONE 4 MG PO TBPK
ORAL_TABLET | ORAL | 0 refills | Status: DC
Start: 2023-02-05 — End: 2023-07-29

## 2023-02-05 MED ORDER — ALBUTEROL SULFATE HFA 108 (90 BASE) MCG/ACT IN AERS: 2.0000 | INHALATION_SPRAY | Freq: Four times a day (QID) | RESPIRATORY_TRACT | 0 refills | Status: DC | PRN

## 2023-02-05 MED ORDER — MOMETASONE FURO-FORMOTEROL FUM 200-5 MCG/ACT IN AERO: 2.0000 | INHALATION_SPRAY | Freq: Two times a day (BID) | RESPIRATORY_TRACT | 11 refills | Status: DC

## 2023-02-05 MED ORDER — HYDROCODONE BIT-HOMATROP MBR 5-1.5 MG/5ML PO SOLN: 5.0000 mL | Freq: Four times a day (QID) | ORAL | 0 refills | Status: AC | PRN
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
## 2023-02-05 NOTE — Patient Instructions (Signed)
Please take all new medication as prescribed - the medrol pack, and cough medicine  Ok to change the qvar to [facility] inhaler  Please continue all other medications as before, and refills have been done for the nebulizer and albuterol inhalers  Please have the pharmacy call with any other refills you may need.  Please continue your efforts at being more active, low cholesterol diet, and weight control.  You are otherwise up to date with prevention measures today.  Please keep your appointments with your specialists as you may have planned  You will be contacted regarding the referral for: ENT  Please go to the LAB at the blood drawing area for the tests to be done  You will be contacted by phone if any changes need to be made immediately.  Otherwise, you will receive a letter about your results with an explanation, but please check with MyChart first.  Please remember to sign up for MyChart if you have not done so, as this will be important to you in the future with finding out test results, communicating by private email, and scheduling acute appointments online when needed.  Please make an Appointment to return in 6 months

## 2023-02-06 ENCOUNTER — Telehealth: Payer: Self-pay | Admitting: Internal Medicine

## 2023-02-06 ENCOUNTER — Ambulatory Visit: Payer: Commercial Managed Care - HMO | Admitting: Sports Medicine

## 2023-02-06 DIAGNOSIS — J4541 Moderate persistent asthma with (acute) exacerbation: Secondary | ICD-10-CM

## 2023-02-06 NOTE — Telephone Encounter (Signed)
Ok done hardcopy to cma 

## 2023-02-06 NOTE — Telephone Encounter (Signed)
PT calls post visit with Dr.John in regards to the nebulizer solution prescribed to her. PT forgot to note yesterday that she does not have a nebulizer within her home. She is requesting an order be placed for a nebulizer when possible.  CB: [phone]

## 2023-02-07 ENCOUNTER — Encounter: Payer: Self-pay | Admitting: Internal Medicine

## 2023-02-07 DIAGNOSIS — H9202 Otalgia, left ear: Secondary | ICD-10-CM | POA: Insufficient documentation

## 2023-02-07 DIAGNOSIS — E1169 Type 2 diabetes mellitus with other specified complication: Secondary | ICD-10-CM | POA: Insufficient documentation

## 2023-02-07 NOTE — Assessment & Plan Note (Signed)
Last vitamin D Lab Results  Component Value Date   VD25OH 9.49 (L) 02/05/2023   Low, to start oral replacement

## 2023-02-07 NOTE — Assessment & Plan Note (Signed)
Age and sex appropriate education and counseling updated with regular exercise and diet Referrals for preventative services - pt to call for eye exam, GYN exam for pap and mammogram soon, declines colonoscopy Immunizations addressed - none needed Smoking counseling  - none needed Evidence for depression or other mood disorder - none significant Most recent labs reviewed. I have personally reviewed and have noted: 1) the patient's medical and social history 2) The patient's current medications and supplements 3) The patient's height, weight, and BMI have been recorded in the chart

## 2023-02-07 NOTE — Assessment & Plan Note (Signed)
BP Readings from Last 3 Encounters:  02/05/23 130/76  01/28/23 133/88  11/13/22 (!) 147/85   Stable, pt to continue medical treatment losartan 50 qd, hct 12.5 qd

## 2023-02-07 NOTE — Assessment & Plan Note (Signed)
Lab Results  Component Value Date   TSH 37.91 (H) 02/05/2023   uncontrolled, pt to continue levothyroxine 175 mcg qd as chronic and pt prefers f/u with endo

## 2023-02-07 NOTE — Assessment & Plan Note (Signed)
Lab Results  Component Value Date   HGBA1C 6.5 02/05/2023   Stable, pt to continue current medical treatment toujeo 62 u qd,

## 2023-02-07 NOTE — Assessment & Plan Note (Signed)
Mild persistent unocntrolled, for start medrol pack, home albuterol nebs, cough med prn limited rx, and add dulera inhaler asd

## 2023-02-07 NOTE — Assessment & Plan Note (Signed)
Also referral to ENT per pt reqeust

## 2023-02-09 NOTE — Telephone Encounter (Signed)
Notified pt on Friday rx for nebulizer was faxed to [facility].Marland KitchenRaechel Chute

## 2023-02-10 ENCOUNTER — Ambulatory Visit: Payer: Self-pay | Admitting: Sports Medicine

## 2023-02-12 NOTE — Telephone Encounter (Signed)
Ethelene Browns from [facility] called stated they have tried reaching out to patient 3 times with no response in regards to the nebulizer and they will be placing the order on hold until they hear back from the patient. If any question a good callback number is [phone].

## 2023-02-12 NOTE — Telephone Encounter (Signed)
Called pt gave her msg below. Pt states she only talk to them once. She had been having trouble with her phone. Gave her Ethelene Browns # w/ [employer] so she can call him.Marland KitchenRaechel Chute

## 2023-02-18 ENCOUNTER — Telehealth: Payer: Self-pay | Admitting: Neurology

## 2023-02-18 NOTE — Telephone Encounter (Signed)
[facility] [employer] no auth req   Patient is scheduled at [facility] for 03/10/23 at 3:30 pm.  Mailed packet to the patient.

## 2023-02-25 ENCOUNTER — Ambulatory Visit: Payer: Medicaid Other | Admitting: Family Medicine

## 2023-03-09 ENCOUNTER — Other Ambulatory Visit (INDEPENDENT_AMBULATORY_CARE_PROVIDER_SITE_OTHER): Payer: Medicaid Other

## 2023-03-09 ENCOUNTER — Encounter: Payer: Self-pay | Admitting: Sports Medicine

## 2023-03-09 ENCOUNTER — Ambulatory Visit (INDEPENDENT_AMBULATORY_CARE_PROVIDER_SITE_OTHER): Payer: Medicaid Other | Admitting: Sports Medicine

## 2023-03-09 DIAGNOSIS — Z794 Long term (current) use of insulin: Secondary | ICD-10-CM | POA: Diagnosis not present

## 2023-03-09 DIAGNOSIS — M79641 Pain in right hand: Secondary | ICD-10-CM

## 2023-03-09 DIAGNOSIS — M25562 Pain in left knee: Secondary | ICD-10-CM

## 2023-03-09 DIAGNOSIS — E11610 Type 2 diabetes mellitus with diabetic neuropathic arthropathy: Secondary | ICD-10-CM | POA: Diagnosis not present

## 2023-03-09 DIAGNOSIS — M79642 Pain in left hand: Secondary | ICD-10-CM

## 2023-03-09 DIAGNOSIS — M65332 Trigger finger, left middle finger: Secondary | ICD-10-CM | POA: Diagnosis not present

## 2023-03-09 DIAGNOSIS — M1712 Unilateral primary osteoarthritis, left knee: Secondary | ICD-10-CM | POA: Diagnosis not present

## 2023-03-09 DIAGNOSIS — G8929 Other chronic pain: Secondary | ICD-10-CM | POA: Diagnosis not present

## 2023-03-09 MED ORDER — MELOXICAM 15 MG PO TABS: 15.0000 mg | ORAL_TABLET | Freq: Every day | ORAL | 0 refills | Status: DC

## 2023-03-09 NOTE — Progress Notes (Signed)
Left hand pain Cramping type sensation History of surgery (carpal tunnel) Does not wear any type of brace on it  Left knee pain Years of pain Complaining of swelling No surgery History of taking tylenol #3 for this in past

## 2023-03-09 NOTE — Addendum Note (Signed)
Addended by: Ellis Savage on: 03/09/2023 02:44 PM   Modules accepted: Orders
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
## 2023-03-10 ENCOUNTER — Other Ambulatory Visit: Payer: Self-pay | Admitting: Internal Medicine

## 2023-03-10 ENCOUNTER — Ambulatory Visit: Payer: Medicaid Other | Admitting: Neurology

## 2023-03-10 DIAGNOSIS — R519 Headache, unspecified: Secondary | ICD-10-CM

## 2023-03-10 DIAGNOSIS — E039 Hypothyroidism, unspecified: Secondary | ICD-10-CM

## 2023-03-10 DIAGNOSIS — Z79899 Other long term (current) drug therapy: Secondary | ICD-10-CM

## 2023-03-10 DIAGNOSIS — Z79891 Long term (current) use of opiate analgesic: Secondary | ICD-10-CM

## 2023-03-10 DIAGNOSIS — G4733 Obstructive sleep apnea (adult) (pediatric): Secondary | ICD-10-CM | POA: Diagnosis not present

## 2023-03-10 DIAGNOSIS — R351 Nocturia: Secondary | ICD-10-CM

## 2023-03-10 DIAGNOSIS — G4719 Other hypersomnia: Secondary | ICD-10-CM

## 2023-03-11 ENCOUNTER — Other Ambulatory Visit: Payer: Self-pay | Admitting: Sports Medicine

## 2023-03-11 ENCOUNTER — Telehealth: Payer: Self-pay | Admitting: Sports Medicine

## 2023-03-11 MED ORDER — LIDOCAINE 4 % EX PTCH: 1.0000 | MEDICATED_PATCH | CUTANEOUS | 0 refills | Status: DC

## 2023-03-11 NOTE — Telephone Encounter (Signed)
Patient called in requesting lidocaine patches for her knee please advise

## 2023-03-12 NOTE — Progress Notes (Signed)
See procedure note.

## 2023-03-16 ENCOUNTER — Telehealth: Payer: Self-pay | Admitting: Neurology

## 2023-03-16 NOTE — Telephone Encounter (Signed)
Please call the patient and let her know that it looks like Dr Frances Furbish has to read the sleep study. I checked and I don't see any results for the patient quite yet but Dr Frances Furbish will likely be reading them this week. I would give it until Thursday morning and then reach back out to [place] if she hasn't heard yet.

## 2023-03-16 NOTE — Telephone Encounter (Signed)
Pt called and LVM stating that she saw that her Sleep Study results popped up on Mychart but she is not able to access them. She would like to be called with the results. Please advise.

## 2023-03-16 NOTE — Addendum Note (Signed)
Addended by: Huston Foley on: 03/16/2023 06:18 PM   Modules accepted: Orders

## 2023-03-16 NOTE — Procedures (Signed)
[facility]  HOME SLEEP TEST (Watch PAT) REPORT  STUDY DATE: 03/10/2023  DOB: 22-Apr-1971  MRN: [id number]  ORDERING CLINICIAN: Huston Foley, MD, PhD   REFERRING CLINICIAN: Dr. Ida Rogue, NP  CLINICAL INFORMATION/HISTORY: 52 year old female with an underlying medical history of migraine headaches, anemia, arthritis, asthma, depression, diabetes, hypertension, sciatica, allergies, hypothyroidism, and morbid obesity with a BMI of over 60, who was previously diagnosed with obstructive sleep apnea. She has not been on PAP therapy.   Epworth sleepiness score: 19/24.  BMI: 62.5 kg/m  FINDINGS:   Sleep Summary:   Total Recording Time (hours, min): 8 hours, 13 min  Total Sleep Time (hours, min):  6 hours, 10 min  Percent REM (%):    N/A inconclusive REM detected.  Respiratory Indices:   Calculated pAHI (per hour):  21.3/hour         REM pAHI:    N/A       NREM pAHI: 21.3/hour  Central pAHI: 0/hour  Oxygen Saturation Statistics:    Oxygen Saturation (%) Mean: 95%   Minimum oxygen saturation (%):                 82%   O2 Saturation Range (%): 82 - 100%    O2 Saturation (minutes) <=88%: 1.6 min (O2 sensor dislodged for about a quarter of the study)  Pulse Rate Statistics:   Pulse Mean (bpm):    66/min    Pulse Range (51 - 106/min)   IMPRESSION: OSA (obstructive sleep apnea), moderate  RECOMMENDATION:  This home sleep test demonstrates moderate obstructive sleep apnea with a total AHI of 21.3/hour and O2 nadir of 82%.  REM sleep was not detected during this test and the oxygen sensor dislodged for about a quarter of the study, possibly under estimating her sleep disordered breathing.  The snore and position sensor was not detected during this test.  Treatment with a positive airway pressure (PAP) device is recommended. The patient will be advised to proceed with an autoPAP titration/trial at home for now. A full night titration study  may be considered to optimize treatment settings, monitor proper oxygen saturations and aid with improvement of tolerance and adherence, if needed down the road. Alternative treatment options may include a dental device through dentistry or orthodontics in selected patients or Inspire (hypoglossal nerve stimulator) in carefully selected patients (meeting inclusion criteria).  Concomitant weight loss is highly recommended (where clinically appropriate). Please note that untreated obstructive sleep apnea may carry additional perioperative morbidity. Patients with significant obstructive sleep apnea should receive perioperative PAP therapy and the surgeons and particularly the anesthesiologist should be informed of the diagnosis and the severity of the sleep disordered breathing. The patient should be cautioned not to drive, work at heights, or operate dangerous or heavy equipment when tired or sleepy. Review and reiteration of good sleep hygiene measures should be pursued with any patient. Other causes of the patient's symptoms, including circadian rhythm disturbances, an underlying mood disorder, medication effect and/or an underlying medical problem cannot be ruled out based on this test. Clinical correlation is recommended.  The patient and her referring provider will be notified of the test results. The patient will be seen in follow up in sleep clinic at [facility].  I certify that I have reviewed the raw data recording prior to the issuance of this report in accordance with the standards of the American Academy of Sleep Medicine (AASM).  INTERPRETING PHYSICIAN:   Huston Foley, MD, PhD Medical Director, [facility]  Sleep at [facility]) Diplomat, ABPN (Neurology and Sleep)   [facility] [address], [address] [place], [place] [address] [phone]

## 2023-03-17 NOTE — Telephone Encounter (Signed)
I spoke with the patient we discussed her sleep study results.  The patient is amenable to proceeding with AutoPap.  Her questions were answered.  We discussed the insurance compliance requirements which includes using the machine at least 4 hours at night and also being seen in our office between 30 and 90 days after set up on the machine.  The patient did not have a preference for DME company so I am referring her to [facility] here in [place].  Patient was appreciative and she will watch for a call from their office.  I sent her their contact information through MyChart.  Her initial follow-up which will be combined with headache follow-up has been scheduled for August 15 at 11:30 AM.   Referral faxed to [facility]. Received a receipt of confirmation.

## 2023-03-17 NOTE — Telephone Encounter (Signed)
Huston Foley, MD 03/16/2023  6:18 PM EDT Back to Top    Patient referred by MM and Dr. Terrace Arabia, seen by me on 01/28/2023 for reevaluation of her sleep apnea, she has not been on PAP therapy, patient had a HST on 03/10/2023.     Please call and notify the patient that the recent home sleep test showed obstructive sleep apnea in the moderate range. I recommend treatment in the form of autoPAP, which means, that we don't have to bring her in for a sleep study with CPAP, but will let her start using a so called autoPAP machine at home, which is a CPAP-like machine with self-adjusting pressures. We will send the order to a local DME company (of her choice, or as per insurance requirement). The DME representative will fit her with a mask, educate her on how to use the machine, how to put the mask on, etc. I have placed an order in the chart. Please send the order, talk to patient, send report to referring MD. We will need a FU in sleep clinic for 10 weeks post-PAP set up, please arrange that with me or one of our NPs. Also reinforce the need for compliance with treatment. Thanks,   Huston Foley, MD, PhD [facility])

## 2023-04-01 DIAGNOSIS — G4733 Obstructive sleep apnea (adult) (pediatric): Secondary | ICD-10-CM | POA: Diagnosis not present

## 2023-04-04 DIAGNOSIS — G4733 Obstructive sleep apnea (adult) (pediatric): Secondary | ICD-10-CM | POA: Diagnosis not present

## 2023-04-13 ENCOUNTER — Ambulatory Visit: Payer: Medicaid Other | Admitting: Physical Therapy

## 2023-04-22 DIAGNOSIS — H9202 Otalgia, left ear: Secondary | ICD-10-CM | POA: Diagnosis not present

## 2023-04-28 ENCOUNTER — Telehealth: Payer: Self-pay

## 2023-04-28 NOTE — Telephone Encounter (Signed)
Patient called to reschedule her appt to another day. Could someone please assist with this. Thanks.,

## 2023-04-29 ENCOUNTER — Ambulatory Visit: Payer: Medicaid Other

## 2023-04-30 ENCOUNTER — Ambulatory Visit: Payer: Commercial Managed Care - HMO | Admitting: Internal Medicine

## 2023-05-04 DIAGNOSIS — G4733 Obstructive sleep apnea (adult) (pediatric): Secondary | ICD-10-CM | POA: Diagnosis not present

## 2023-05-06 ENCOUNTER — Ambulatory Visit: Payer: Medicaid Other | Admitting: Physical Therapy

## 2023-05-18 ENCOUNTER — Encounter: Payer: Self-pay | Admitting: Internal Medicine

## 2023-05-18 ENCOUNTER — Other Ambulatory Visit: Payer: Self-pay | Admitting: Internal Medicine

## 2023-05-18 ENCOUNTER — Ambulatory Visit (INDEPENDENT_AMBULATORY_CARE_PROVIDER_SITE_OTHER): Payer: Medicaid Other | Admitting: Internal Medicine

## 2023-05-18 VITALS — BP 128/80 | HR 75 | Ht 62.0 in | Wt 352.6 lb

## 2023-05-18 DIAGNOSIS — E119 Type 2 diabetes mellitus without complications: Secondary | ICD-10-CM

## 2023-05-18 DIAGNOSIS — Z794 Long term (current) use of insulin: Secondary | ICD-10-CM

## 2023-05-18 DIAGNOSIS — E785 Hyperlipidemia, unspecified: Secondary | ICD-10-CM | POA: Diagnosis not present

## 2023-05-18 DIAGNOSIS — E1165 Type 2 diabetes mellitus with hyperglycemia: Secondary | ICD-10-CM

## 2023-05-18 DIAGNOSIS — E039 Hypothyroidism, unspecified: Secondary | ICD-10-CM | POA: Diagnosis not present

## 2023-05-18 DIAGNOSIS — E1169 Type 2 diabetes mellitus with other specified complication: Secondary | ICD-10-CM

## 2023-05-18 LAB — T4, FREE: Free T4: 0.54 ng/dL — ABNORMAL LOW (ref 0.60–1.60)

## 2023-05-18 LAB — HEMOGLOBIN A1C: Hemoglobin A1C: 6.1

## 2023-05-18 LAB — TSH: TSH: 48.54 u[IU]/mL — ABNORMAL HIGH (ref 0.35–5.50)

## 2023-05-18 MED ORDER — LEVOTHYROXINE SODIUM 200 MCG PO TABS
200.0000 ug | ORAL_TABLET | Freq: Every day | ORAL | 3 refills | Status: DC
Start: 2023-05-18 — End: 2023-09-03
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
## 2023-05-18 NOTE — Patient Instructions (Addendum)
Please continue: - Toujeo 62 units daily in am   Please continue Levothyroxine 175 mcg daily.   Take the thyroid hormone every day, with water, at least 30 minutes before breakfast, separated by at least 4 hours from: - acid reflux medications - calcium - iron - multivitamins   Please stop at the lab.   Please return in 6 months.

## 2023-05-25 ENCOUNTER — Ambulatory Visit: Payer: Medicaid Other | Admitting: Sports Medicine

## 2023-05-28 ENCOUNTER — Ambulatory Visit: Payer: Commercial Managed Care - HMO | Admitting: Adult Health
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
## 2023-06-04 ENCOUNTER — Ambulatory Visit: Payer: Medicaid Other | Attending: Sports Medicine

## 2023-06-04 ENCOUNTER — Ambulatory Visit (INDEPENDENT_AMBULATORY_CARE_PROVIDER_SITE_OTHER): Payer: Medicaid Other | Admitting: Sports Medicine

## 2023-06-04 ENCOUNTER — Other Ambulatory Visit: Payer: Self-pay

## 2023-06-04 ENCOUNTER — Encounter: Payer: Self-pay | Admitting: Sports Medicine

## 2023-06-04 DIAGNOSIS — Z794 Long term (current) use of insulin: Secondary | ICD-10-CM

## 2023-06-04 DIAGNOSIS — M65332 Trigger finger, left middle finger: Secondary | ICD-10-CM

## 2023-06-04 DIAGNOSIS — G8929 Other chronic pain: Secondary | ICD-10-CM | POA: Diagnosis not present

## 2023-06-04 DIAGNOSIS — R262 Difficulty in walking, not elsewhere classified: Secondary | ICD-10-CM | POA: Insufficient documentation

## 2023-06-04 DIAGNOSIS — M25562 Pain in left knee: Secondary | ICD-10-CM | POA: Diagnosis not present

## 2023-06-04 DIAGNOSIS — M6281 Muscle weakness (generalized): Secondary | ICD-10-CM | POA: Insufficient documentation

## 2023-06-04 DIAGNOSIS — M1712 Unilateral primary osteoarthritis, left knee: Secondary | ICD-10-CM

## 2023-06-04 DIAGNOSIS — E11610 Type 2 diabetes mellitus with diabetic neuropathic arthropathy: Secondary | ICD-10-CM

## 2023-06-04 DIAGNOSIS — G4733 Obstructive sleep apnea (adult) (pediatric): Secondary | ICD-10-CM | POA: Diagnosis not present

## 2023-06-04 MED ORDER — ACETAMINOPHEN-CODEINE 300-30 MG PO TABS: 1.0000 | ORAL_TABLET | Freq: Four times a day (QID) | ORAL | 0 refills | Status: DC | PRN

## 2023-06-04 MED ORDER — CYCLOBENZAPRINE HCL 10 MG PO TABS: 10.0000 mg | ORAL_TABLET | Freq: Two times a day (BID) | ORAL | 0 refills | Status: AC | PRN

## 2023-06-04 NOTE — Progress Notes (Signed)
Has forms that needs to be filled out but needed to be seen for this before these can be filled out.  Wants something for pain for her sciatica flare-up if at all possible. Also left hand is really still bothering her a lot. Going to PT for her first visit today.

## 2023-06-04 NOTE — Patient Instructions (Signed)
Aquatic Therapy at [facility]-  What to Expect!  Where:   [facility] [address] [facility] [place], [place] [address] Rehab phone [phone]  NOTE:  You will receive an automated phone message reminding you of your appt and it will say the appointment is at the [address] [facility] [address] [facility] clinic.          How to Prepare: Please make sure you drink 8 ounces of water about one hour prior to your pool session A caregiver may attend if needed with the patient to help assist as needed. A caregiver can sit in the pool room on chair. Please arrive IN YOUR SUIT and 15 minutes prior to your appointment - this helps to avoid delays in starting your session. Please make sure to attend to any toileting needs prior to entering the pool Locker rooms for changing are provided.   There is direct access to the pool deck form the locker room.  You can lock your belongings in a locker with lock provided. Once on the pool deck your therapist will ask if you have signed the Patient  Consent and Assignment of Benefits form before beginning treatment Your therapist may take your blood pressure prior to, during and after your session if indicated We usually try and create a home exercise program based on activities we do in the pool.  Please be thinking about who might be able to assist you in the pool should you need to participate in an aquatic home exercise program at the time of discharge if you need assistance.  Some patients do not want to or do not have the ability to participate in an aquatic home program - this is not a barrier in any way to you participating in aquatic therapy as part of your current therapy plan! After Discharge from PT, you can continue using home program at  the [facility]/, there is a drop-in fee for $5 ($45 a month)or for 60 years  or older $4.00 ($40 a month for seniors ) or any local [employer] pool.  Memberships for purchase are  available for gym/pool at [facility]  IT IS VERY IMPORTANT THAT YOUR LAST VISIT BE IN THE CLINIC AT [address] AFTER YOUR LAST AQUATIC VISIT.  PLEASE MAKE SURE THAT YOU HAVE A [address]  APPOINTMENT SCHEDULED.   About the pool: Pool is located approximately 500 FT from the entrance of the building.  Please bring a support person if you need assistance traveling this      distance.   Your therapist will assist you in entering the water; there are two ways to           enter: stairs with railings, and a mechanical lift. Your therapist will determine the most appropriate way for you.  Water temperature is usually between 88-90 degrees  There may be up to 2 other swimmers in the pool at the same time  The pool deck is tile, please wear shoes with good traction if you prefer not to be barefoot.    Contact Info:  For appointment scheduling and cancellations:         Please call the [facility]  PH:[phone]              Aquatic Therapy  [facility]       All sessions are 45 minutes                                                    
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
## 2023-06-05 DIAGNOSIS — H04123 Dry eye syndrome of bilateral lacrimal glands: Secondary | ICD-10-CM | POA: Diagnosis not present

## 2023-06-05 DIAGNOSIS — H35033 Hypertensive retinopathy, bilateral: Secondary | ICD-10-CM | POA: Diagnosis not present

## 2023-06-05 DIAGNOSIS — H5213 Myopia, bilateral: Secondary | ICD-10-CM | POA: Diagnosis not present

## 2023-06-05 DIAGNOSIS — E119 Type 2 diabetes mellitus without complications: Secondary | ICD-10-CM | POA: Diagnosis not present

## 2023-06-05 DIAGNOSIS — H0288B Meibomian gland dysfunction left eye, upper and lower eyelids: Secondary | ICD-10-CM | POA: Diagnosis not present

## 2023-06-05 DIAGNOSIS — H0288A Meibomian gland dysfunction right eye, upper and lower eyelids: Secondary | ICD-10-CM | POA: Diagnosis not present

## 2023-06-05 DIAGNOSIS — H524 Presbyopia: Secondary | ICD-10-CM | POA: Diagnosis not present

## 2023-06-05 LAB — HM DIABETES EYE EXAM

## 2023-06-08 ENCOUNTER — Telehealth: Payer: Self-pay | Admitting: Sports Medicine

## 2023-06-08 ENCOUNTER — Other Ambulatory Visit: Payer: Self-pay | Admitting: Sports Medicine

## 2023-06-08 DIAGNOSIS — M25562 Pain in left knee: Secondary | ICD-10-CM

## 2023-06-08 DIAGNOSIS — M1712 Unilateral primary osteoarthritis, left knee: Secondary | ICD-10-CM

## 2023-06-08 MED ORDER — LIDOCAINE 5 % EX PTCH
1.0000 | MEDICATED_PATCH | CUTANEOUS | 0 refills | Status: DC
Start: 2023-06-08 — End: 2023-07-29

## 2023-06-08 NOTE — Telephone Encounter (Signed)
Pt called in requesting Lidocaine patch .5  because Medicaid will pay for that one,Please advise
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
## 2023-06-15 ENCOUNTER — Ambulatory Visit: Payer: Medicaid Other | Admitting: Physical Therapy

## 2023-06-15 ENCOUNTER — Encounter: Payer: Self-pay | Admitting: Physical Therapy

## 2023-06-15 DIAGNOSIS — M25562 Pain in left knee: Secondary | ICD-10-CM | POA: Diagnosis not present

## 2023-06-15 DIAGNOSIS — M6281 Muscle weakness (generalized): Secondary | ICD-10-CM

## 2023-06-15 DIAGNOSIS — G8929 Other chronic pain: Secondary | ICD-10-CM

## 2023-06-15 DIAGNOSIS — R262 Difficulty in walking, not elsewhere classified: Secondary | ICD-10-CM

## 2023-06-17 ENCOUNTER — Ambulatory Visit: Payer: Medicaid Other

## 2023-06-17 DIAGNOSIS — G8929 Other chronic pain: Secondary | ICD-10-CM

## 2023-06-17 DIAGNOSIS — M25562 Pain in left knee: Secondary | ICD-10-CM | POA: Diagnosis not present

## 2023-06-17 DIAGNOSIS — R262 Difficulty in walking, not elsewhere classified: Secondary | ICD-10-CM

## 2023-06-17 DIAGNOSIS — M6281 Muscle weakness (generalized): Secondary | ICD-10-CM

## 2023-06-17 NOTE — Patient Instructions (Addendum)
Aquatic Therapy at [facility]-  What to Expect!  Where:   [facility] [address] [place], [place] [address] Rehab phone [phone]  NOTE:  You will receive an automated phone message reminding you of your appt and it will say the appointment is at the [facility] [facility] clinic.          How to Prepare: Please make sure you drink 8 ounces of water about one hour prior to your pool session A caregiver may attend if needed with the patient to help assist as needed. A caregiver can sit in the pool room on chair. Please arrive IN YOUR SUIT and 15 minutes prior to your appointment - this helps to avoid delays in starting your session. Please make sure to attend to any toileting needs prior to entering the pool Erma rooms for changing are provided.   There is direct access to the pool deck form the locker room.  You can lock your belongings in a locker with lock provided. Once on the pool deck your therapist will ask if you have signed the Patient  Consent and Assignment of Benefits form before beginning treatment Your therapist may take your blood pressure prior to, during and after your session if indicated We usually try and create a home exercise program based on activities we do in the pool.  Please be thinking about who might be able to assist you in the pool should you need to participate in an aquatic home exercise program at the time of discharge if you need assistance.  Some patients do not want to or do not have the ability to participate in an aquatic home program - this is not a barrier in any way to you participating in aquatic therapy as part of your current therapy plan! After Discharge from PT, you can continue using home program at  the [facility], there is a drop-in fee for $5 ($45 a month)or for 60 years  or older $4.00 ($40 a month for seniors ) or any local [employer] pool.  Memberships for purchase are  available for gym/pool at [facility]  IT IS VERY IMPORTANT THAT YOUR LAST VISIT BE IN THE CLINIC AT [facility] STREET AFTER YOUR LAST AQUATIC VISIT.  PLEASE MAKE SURE THAT YOU HAVE A [address]  APPOINTMENT SCHEDULED.   About the pool: Pool is located approximately 500 FT from the entrance of the building.  Please bring a support person if you need assistance traveling this      distance.   Your therapist will assist you in entering the water; there are two ways to           enter: stairs with railings, and a mechanical lift. Your therapist will determine the most appropriate way for you.  Water temperature is usually between 88-90 degrees  There may be up to 2 other swimmers in the pool at the same time  The pool deck is tile, please wear shoes with good traction if you prefer not to be barefoot.    Contact Info:  For appointment scheduling and cancellations:         Please call the [facility]  PH:[phone]              Aquatic Therapy  Outpatient Rehabilitation @ [facility]       All sessions are 45 minutes  Trigger Point Dry Needling  What is Trigger Point Dry Needling (DN)? DN is a physical therapy technique used to treat muscle pain and dysfunction. Specifically, DN helps deactivate muscle trigger points (muscle knots).  A thin filiform needle is used to penetrate the skin and stimulate the underlying trigger point. The goal is for a local twitch response (LTR) to occur and for the trigger point to relax. No medication of any kind is injected during the procedure.   What Does Trigger Point Dry Needling Feel Like?  The procedure feels different for each individual patient. Some patients report that they do not actually feel the needle enter the skin and overall the process is not painful. Very mild bleeding may occur. However, many patients feel a deep cramping in the muscle in which the  needle was inserted. This is the local twitch response.   How Will I feel after the treatment? Soreness is normal, and the onset of soreness may not occur for a few hours. Typically this soreness does not last longer than two days.  Bruising is uncommon, however; ice can be used to decrease any possible bruising.  In rare cases feeling tired or nauseous after the treatment is normal. In addition, your symptoms may get worse before they get better, this period will typically not last longer than 24 hours.   What Can I do After My Treatment? Increase your hydration by drinking more water for the next 24 hours. You may place ice or heat on the areas treated that have become sore, however, do not use heat on inflamed or bruised areas. Heat often brings more relief post needling. You can continue your regular activities, but vigorous activity is not recommended initially after the treatment for 24 hours. DN is best combined with other physical therapy such as strengthening, stretching, and other therapies.
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
## 2023-06-17 NOTE — Progress Notes (Unsigned)
I contacted pt regarding compliance.  She stated she has not been using machine in the last month because she feels like too much air is being forced and it hurts her chest.  She stated she spoke with DME and was informed we could change the pressure settings.  Patient never contacted [place] to inform us of this or that she was having intolerance to it.  I informed her yes, we could have changed the pressure settings if we would have known sooner. She is now almost at the end of her compliance period.  I informed her at this point since she has not been compliant, insurance may not cover for the machine.  She may have to return the equipment to DME.  Patient verbally understood, will discuss this with provider when she comes in for appointment.

## 2023-06-18 ENCOUNTER — Encounter: Payer: Self-pay | Admitting: Adult Health

## 2023-06-18 ENCOUNTER — Ambulatory Visit: Payer: Medicaid Other | Admitting: Adult Health

## 2023-06-18 VITALS — BP 140/87 | HR 64 | Ht 65.0 in | Wt 358.0 lb

## 2023-06-18 DIAGNOSIS — G4733 Obstructive sleep apnea (adult) (pediatric): Secondary | ICD-10-CM | POA: Diagnosis not present
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
## 2023-06-22 ENCOUNTER — Ambulatory Visit: Payer: Medicaid Other | Admitting: Physical Therapy

## 2023-06-22 ENCOUNTER — Encounter: Payer: Self-pay | Admitting: Physical Therapy

## 2023-06-22 DIAGNOSIS — G8929 Other chronic pain: Secondary | ICD-10-CM

## 2023-06-22 DIAGNOSIS — R262 Difficulty in walking, not elsewhere classified: Secondary | ICD-10-CM

## 2023-06-22 DIAGNOSIS — M25562 Pain in left knee: Secondary | ICD-10-CM | POA: Diagnosis not present

## 2023-06-22 DIAGNOSIS — M6281 Muscle weakness (generalized): Secondary | ICD-10-CM
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
## 2023-06-24 ENCOUNTER — Encounter: Payer: Medicaid Other | Admitting: Physical Therapy

## 2023-06-24 ENCOUNTER — Ambulatory Visit: Payer: Medicaid Other | Admitting: Physical Therapy

## 2023-06-24 ENCOUNTER — Encounter: Payer: Self-pay | Admitting: Physical Therapy

## 2023-06-24 DIAGNOSIS — R262 Difficulty in walking, not elsewhere classified: Secondary | ICD-10-CM

## 2023-06-24 DIAGNOSIS — M25562 Pain in left knee: Secondary | ICD-10-CM | POA: Diagnosis not present

## 2023-06-24 DIAGNOSIS — M6281 Muscle weakness (generalized): Secondary | ICD-10-CM

## 2023-06-24 DIAGNOSIS — G8929 Other chronic pain: Secondary | ICD-10-CM

## 2023-06-24 NOTE — Progress Notes (Addendum)
pressure change Received: Today Bobbye Morton, CMA  Zott, Stacy; Gamble, Tammy New orders have been placed for the above pt, DOB: 07-18-2071 Thanks  Zott, Stacy  Anni Hocevar, Abbe Amsterdam, CMA; Melvern Sample Got It

## 2023-06-29 ENCOUNTER — Encounter: Payer: Self-pay | Admitting: Physical Therapy

## 2023-06-29 ENCOUNTER — Ambulatory Visit: Payer: Medicaid Other | Admitting: Physical Therapy

## 2023-06-29 DIAGNOSIS — R262 Difficulty in walking, not elsewhere classified: Secondary | ICD-10-CM

## 2023-06-29 DIAGNOSIS — M25562 Pain in left knee: Secondary | ICD-10-CM | POA: Diagnosis not present

## 2023-06-29 DIAGNOSIS — M6281 Muscle weakness (generalized): Secondary | ICD-10-CM

## 2023-06-29 DIAGNOSIS — G8929 Other chronic pain: Secondary | ICD-10-CM
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
## 2023-07-01 ENCOUNTER — Ambulatory Visit: Payer: Medicaid Other | Admitting: Physical Therapy

## 2023-07-01 ENCOUNTER — Encounter: Payer: Self-pay | Admitting: Physical Therapy

## 2023-07-01 DIAGNOSIS — M6281 Muscle weakness (generalized): Secondary | ICD-10-CM

## 2023-07-01 DIAGNOSIS — M25562 Pain in left knee: Secondary | ICD-10-CM | POA: Diagnosis not present

## 2023-07-01 DIAGNOSIS — R262 Difficulty in walking, not elsewhere classified: Secondary | ICD-10-CM

## 2023-07-01 DIAGNOSIS — G8929 Other chronic pain: Secondary | ICD-10-CM

## 2023-07-05 DIAGNOSIS — G4733 Obstructive sleep apnea (adult) (pediatric): Secondary | ICD-10-CM | POA: Diagnosis not present
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
## 2023-07-07 ENCOUNTER — Ambulatory Visit: Payer: Medicaid Other | Admitting: Physical Therapy

## 2023-07-13 ENCOUNTER — Ambulatory Visit: Payer: Medicaid Other | Admitting: Physical Therapy

## 2023-07-13 ENCOUNTER — Ambulatory Visit (INDEPENDENT_AMBULATORY_CARE_PROVIDER_SITE_OTHER): Payer: Medicaid Other | Admitting: Orthopedic Surgery

## 2023-07-13 DIAGNOSIS — M65332 Trigger finger, left middle finger: Secondary | ICD-10-CM | POA: Diagnosis not present
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
## 2023-07-15 ENCOUNTER — Telehealth: Payer: Self-pay | Admitting: Adult Health

## 2023-07-15 DIAGNOSIS — G4733 Obstructive sleep apnea (adult) (pediatric): Secondary | ICD-10-CM

## 2023-07-15 NOTE — Telephone Encounter (Signed)
Pt reports that a piece of her CPAP broke, she reported that to DME who informed her that due to her not being compliant they dropped her account. In order to reactivate she needs another sleep study, please call pt to discuss.

## 2023-07-15 NOTE — Telephone Encounter (Signed)
CM sent to [facility]

## 2023-07-16 ENCOUNTER — Ambulatory Visit: Payer: Medicaid Other | Attending: Sports Medicine | Admitting: Physical Therapy

## 2023-07-16 ENCOUNTER — Encounter: Payer: Self-pay | Admitting: Physical Therapy

## 2023-07-16 DIAGNOSIS — R262 Difficulty in walking, not elsewhere classified: Secondary | ICD-10-CM | POA: Diagnosis present

## 2023-07-16 DIAGNOSIS — M6281 Muscle weakness (generalized): Secondary | ICD-10-CM | POA: Diagnosis present

## 2023-07-16 DIAGNOSIS — G8929 Other chronic pain: Secondary | ICD-10-CM | POA: Diagnosis present

## 2023-07-16 DIAGNOSIS — M25562 Pain in left knee: Secondary | ICD-10-CM | POA: Diagnosis present
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
## 2023-07-16 NOTE — Telephone Encounter (Signed)
Vanessa [name] has not quite received response back from [facility] about this.

## 2023-07-20 ENCOUNTER — Ambulatory Visit: Payer: Medicaid Other | Admitting: Physical Therapy

## 2023-07-20 ENCOUNTER — Ambulatory Visit (INDEPENDENT_AMBULATORY_CARE_PROVIDER_SITE_OTHER): Payer: Medicaid Other

## 2023-07-20 ENCOUNTER — Ambulatory Visit (INDEPENDENT_AMBULATORY_CARE_PROVIDER_SITE_OTHER): Payer: Medicaid Other | Admitting: Sports Medicine

## 2023-07-20 ENCOUNTER — Encounter: Payer: Self-pay | Admitting: Sports Medicine

## 2023-07-20 ENCOUNTER — Encounter: Payer: Self-pay | Admitting: Physical Therapy

## 2023-07-20 DIAGNOSIS — R2689 Other abnormalities of gait and mobility: Secondary | ICD-10-CM

## 2023-07-20 DIAGNOSIS — M6281 Muscle weakness (generalized): Secondary | ICD-10-CM

## 2023-07-20 DIAGNOSIS — E11610 Type 2 diabetes mellitus with diabetic neuropathic arthropathy: Secondary | ICD-10-CM | POA: Diagnosis not present

## 2023-07-20 DIAGNOSIS — Z794 Long term (current) use of insulin: Secondary | ICD-10-CM | POA: Diagnosis not present

## 2023-07-20 DIAGNOSIS — G8929 Other chronic pain: Secondary | ICD-10-CM

## 2023-07-20 DIAGNOSIS — M25572 Pain in left ankle and joints of left foot: Secondary | ICD-10-CM | POA: Diagnosis not present

## 2023-07-20 DIAGNOSIS — M1712 Unilateral primary osteoarthritis, left knee: Secondary | ICD-10-CM | POA: Diagnosis not present

## 2023-07-20 DIAGNOSIS — M25562 Pain in left knee: Secondary | ICD-10-CM

## 2023-07-20 DIAGNOSIS — R262 Difficulty in walking, not elsewhere classified: Secondary | ICD-10-CM

## 2023-07-20 MED ORDER — MELOXICAM 15 MG PO TABS: 15.0000 mg | ORAL_TABLET | Freq: Every day | ORAL | 0 refills | Status: DC
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
## 2023-07-20 NOTE — Progress Notes (Signed)
Patient states that last week she began having pain in her left ankle after physical therapy. She says that one exercise flared it up (demonstrates calf raises using the chair for balance assistance). She states that she was unable to walk until after the weekend (pain lasted about a week) and she cancelled her Monday physical therapy appointment due to pain. She points to the ATFL area of her left ankle when describing the pain, and says it is sometimes sharp, sometimes dull and achy. Patient says that she has been taking Tylenol 3 but that it does not seem to help. Patient does not have popping or numbness and tingling in the ankle.

## 2023-07-21 NOTE — Telephone Encounter (Addendum)
I spoke with Darryl @ [facility]. He confirmed with manager that patient does need to have another sleep study. It can be either diagnostic or a titration.  The visit she just had in August should be sufficient as it does address her noncompliance.  I tried to look at [facility] just to see what her most recent compliance is but the website is down at the moment.

## 2023-07-22 DIAGNOSIS — H524 Presbyopia: Secondary | ICD-10-CM | POA: Diagnosis not present

## 2023-07-22 NOTE — Addendum Note (Signed)
Addended by: Bertram Savin on: 07/22/2023 04:12 PM   Modules accepted: Orders

## 2023-07-22 NOTE — Telephone Encounter (Signed)
HST

## 2023-07-22 NOTE — Telephone Encounter (Signed)
I called the pt and let her know HST has been ordered. She will watch for a call from our office to schedule the test. She asked if she can keep her machine and I advised her to talk w/ [facility] about that. She was appreciative.

## 2023-07-23 ENCOUNTER — Ambulatory Visit (HOSPITAL_COMMUNITY)
Admission: EM | Admit: 2023-07-23 | Discharge: 2023-07-23 | Disposition: A | Discharge status: Home / Self Care | Payer: Medicaid Other | Attending: Family Medicine | Admitting: Family Medicine

## 2023-07-23 ENCOUNTER — Encounter (HOSPITAL_COMMUNITY): Payer: Self-pay | Admitting: *Deleted

## 2023-07-23 ENCOUNTER — Ambulatory Visit (INDEPENDENT_AMBULATORY_CARE_PROVIDER_SITE_OTHER): Payer: Medicaid Other

## 2023-07-23 ENCOUNTER — Ambulatory Visit: Payer: Medicaid Other | Admitting: Physical Therapy

## 2023-07-23 DIAGNOSIS — S93491A Sprain of other ligament of right ankle, initial encounter: Secondary | ICD-10-CM | POA: Diagnosis not present

## 2023-07-23 DIAGNOSIS — M25571 Pain in right ankle and joints of right foot: Secondary | ICD-10-CM | POA: Diagnosis not present

## 2023-07-23 MED ORDER — HYDROCODONE-ACETAMINOPHEN 5-325 MG PO TABS
ORAL_TABLET | ORAL | Status: AC
  Filled 2023-07-23: qty 1

## 2023-07-23 MED ORDER — HYDROCODONE-ACETAMINOPHEN 5-325 MG PO TABS
1.0000 | ORAL_TABLET | Freq: Once | ORAL | Status: AC
  Administered 2023-07-23: 1 via ORAL

## 2023-07-23 NOTE — ED Triage Notes (Signed)
Pt states she has right ankle pain. She states Tuesday her foot slide down into a board on a ramp.   She is already in a brace and PT for her left ankle and foot. She has some tylenol #3 for her pain
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
## 2023-07-23 NOTE — Discharge Instructions (Signed)
Continue to take Tylenol 3 at home which was prescribed by orthopedic office.   Ice the ankle at least nightly for 15 min at a time, elevate when resting, and compression Wear the ankle brace for support when walking or doing daily activities.  Follow up with orthopedic office in 2 weeks for reassessment Once able to bear weight without brace, transition out and start tracing the alphabet with your foot every day.

## 2023-07-27 ENCOUNTER — Telehealth: Payer: Self-pay | Admitting: Sports Medicine

## 2023-07-27 ENCOUNTER — Encounter: Payer: Medicaid Other | Admitting: Physical Therapy

## 2023-07-27 NOTE — Telephone Encounter (Signed)
Patient called wanting to know can she get a boot for left ankle? She said it hurts so bad. ZO#[phone]

## 2023-07-29 ENCOUNTER — Ambulatory Visit: Payer: Medicaid Other | Admitting: Physical Therapy

## 2023-07-30 DIAGNOSIS — S93491A Sprain of other ligament of right ankle, initial encounter: Secondary | ICD-10-CM | POA: Diagnosis not present

## 2023-08-03 ENCOUNTER — Ambulatory Visit: Payer: Medicaid Other | Admitting: Physical Therapy

## 2023-08-03 ENCOUNTER — Encounter: Payer: Self-pay | Admitting: Physical Therapy

## 2023-08-03 ENCOUNTER — Ambulatory Visit (INDEPENDENT_AMBULATORY_CARE_PROVIDER_SITE_OTHER): Payer: Medicaid Other | Admitting: Sports Medicine

## 2023-08-03 ENCOUNTER — Encounter: Payer: Self-pay | Admitting: Sports Medicine

## 2023-08-03 DIAGNOSIS — M6281 Muscle weakness (generalized): Secondary | ICD-10-CM

## 2023-08-03 DIAGNOSIS — M1712 Unilateral primary osteoarthritis, left knee: Secondary | ICD-10-CM

## 2023-08-03 DIAGNOSIS — M25572 Pain in left ankle and joints of left foot: Secondary | ICD-10-CM | POA: Diagnosis not present

## 2023-08-03 DIAGNOSIS — M25472 Effusion, left ankle: Secondary | ICD-10-CM | POA: Diagnosis not present

## 2023-08-03 DIAGNOSIS — G8929 Other chronic pain: Secondary | ICD-10-CM

## 2023-08-03 DIAGNOSIS — R262 Difficulty in walking, not elsewhere classified: Secondary | ICD-10-CM

## 2023-08-03 DIAGNOSIS — S93401A Sprain of unspecified ligament of right ankle, initial encounter: Secondary | ICD-10-CM | POA: Diagnosis not present

## 2023-08-03 DIAGNOSIS — M25562 Pain in left knee: Secondary | ICD-10-CM | POA: Diagnosis not present
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
## 2023-08-03 NOTE — Progress Notes (Signed)
Patient states that left ankle feels a bit better since getting the boot last week and wearing it over the weekend. She says that it is still swollen.  Patient states that right ankle is still tender and painful. She has been using a lace-up ankle brace for this ankle.  Patient was instructed in 10 minutes of therapeutic exercises for bilateral ankles to improve strength, ROM and function according to my instructions and plan of care by a Certified Athletic Trainer during the office visit. A customized handout was provided and demonstration of proper technique shown and discussed. Patient did perform exercises and demonstrate understanding through teachback.  All questions discussed and answered.

## 2023-08-04 DIAGNOSIS — G4733 Obstructive sleep apnea (adult) (pediatric): Secondary | ICD-10-CM | POA: Diagnosis not present

## 2023-08-05 ENCOUNTER — Ambulatory Visit: Payer: Medicaid Other | Admitting: Physical Therapy

## 2023-08-06 ENCOUNTER — Ambulatory Visit: Payer: Commercial Managed Care - HMO | Admitting: Internal Medicine

## 2023-08-13 ENCOUNTER — Encounter (HOSPITAL_COMMUNITY): Payer: Self-pay | Admitting: Orthopedic Surgery

## 2023-08-13 NOTE — Progress Notes (Signed)
SDW call  Patient was given pre-op instructions over the phone. Patient verbalized understanding of instructions provided.     PCP - Dr. Oliver Barre Cardiologist -  Pulmonary:    PPM/ICD - denies Device Orders - na Rep Notified - na   Chest x-ray - na EKG -  DOS, 08/14/2023 Stress Test - ECHO -  Cardiac Cath -   Sleep Study/sleep apnea/CPAP: Diagnosed with sleep apnea and  wears CPAP.  States she is having another sleep study to get fitted for a new CPAP  Type II diabetic Fasting Blood sugar range: 97-120 How often check sugars: daily Insulin glargine (Toujeo solostar), instructed to take 31 units DOS, this is 50% of her regular dose.    Blood Thinner Instructions: denies Aspirin Instructions:denies   ERAS Protcol - NPO  COVID TEST- na    Anesthesia review: Yes. HTN, DM, OSA with CPAP, heart murmur   Patient denies shortness of breath, fever, cough and chest pain over the phone call  Your procedure is scheduled on  Friday August 14, 2023   Report to [facility] Main Entrance "A" at  0530   A.M., then check in with the Admitting office.  Call this number if you have problems the morning of surgery:  [phone]   If you have any questions prior to your surgery date call [phone]: Open Monday-Friday 8am-4pm If you experience any cold or flu symptoms such as cough, fever, chills, shortness of breath, etc. between now and your scheduled surgery, please notify us at the above number     Remember:  Do not eat or drink after midnight the night before your surgery  Take these medicines the morning of surgery with A SIP OF WATER:  Celexa, levothyroxine  As needed: Tylenol, albuterol neb/inhaler, Qvar inhaler, zyrtec, flexeril, gabapentin, dulera, singulair, maxalt, imitrex, nasacort  As of today, STOP taking any Aspirin (unless otherwise instructed by your surgeon) Aleve, Naproxen, Ibuprofen, Motrin, Advil, Goody's, BC's, all herbal medications, fish oil, and all  vitamins.  This includes your Mobic.

## 2023-08-13 NOTE — Anesthesia Preprocedure Evaluation (Addendum)
Anesthesia Evaluation  Patient identified by MRN, date of birth, ID band Patient awake    Reviewed: Allergy & Precautions, NPO status , Patient's Chart, lab work & pertinent test results  History of Anesthesia Complications (+) history of anesthetic complications  Airway Mallampati: I  TM Distance: >3 FB Neck ROM: Full    Dental no notable dental hx. (+) Dental Advisory Given, Teeth Intact   Pulmonary asthma , sleep apnea    Pulmonary exam normal breath sounds clear to auscultation       Cardiovascular hypertension, Pt. on medications (-) angina (-) Past MI and (-) CHF Normal cardiovascular exam+ Valvular Problems/Murmurs  Rhythm:Regular Rate:Normal     Neuro/Psych  Headaches PSYCHIATRIC DISORDERS Anxiety Depression     Neuromuscular disease    GI/Hepatic negative GI ROS, Neg liver ROS,,,  Endo/Other  diabetes, Type 2, Insulin DependentHypothyroidism  Morbid obesity  Renal/GU negative Renal ROS  negative genitourinary   Musculoskeletal  (+) Arthritis ,    Abdominal  (+) + obese  Peds  Hematology  (+) Blood dyscrasia, anemia   Anesthesia Other Findings   Reproductive/Obstetrics                             Anesthesia Physical Anesthesia Plan  ASA: 3  Anesthesia Plan: MAC   Post-op Pain Management: Minimal or no pain anticipated   Induction: Intravenous  PONV Risk Score and Plan: 2 and Propofol infusion, Ondansetron and Midazolam  Airway Management Planned: Natural Airway and Simple Face Mask  Additional Equipment: None  Intra-op Plan:   Post-operative Plan:   Informed Consent: I have reviewed the patients History and Physical, chart, labs and discussed the procedure including the risks, benefits and alternatives for the proposed anesthesia with the patient or authorized representative who has indicated his/her understanding and acceptance.     Dental advisory given  Plan  Discussed with: CRNA and Anesthesiologist  Anesthesia Plan Comments:         Anesthesia Quick Evaluation

## 2023-08-14 ENCOUNTER — Other Ambulatory Visit: Payer: Self-pay

## 2023-08-14 ENCOUNTER — Encounter (HOSPITAL_COMMUNITY): Payer: Self-pay | Admitting: Orthopedic Surgery

## 2023-08-14 ENCOUNTER — Ambulatory Visit (HOSPITAL_COMMUNITY): Payer: Medicaid Other | Admitting: Vascular Surgery

## 2023-08-14 ENCOUNTER — Ambulatory Visit (HOSPITAL_COMMUNITY)
Admission: RE | Admit: 2023-08-14 | Discharge: 2023-08-14 | Disposition: A | Discharge status: Home / Self Care | Payer: Medicaid Other | Attending: Orthopedic Surgery | Admitting: Orthopedic Surgery

## 2023-08-14 ENCOUNTER — Encounter (HOSPITAL_COMMUNITY)
Admission: RE | Disposition: A | Discharge status: Home / Self Care | Payer: Self-pay | Source: Home / Self Care | Attending: Orthopedic Surgery

## 2023-08-14 DIAGNOSIS — Z6841 Body Mass Index (BMI) 40.0 and over, adult: Secondary | ICD-10-CM | POA: Insufficient documentation

## 2023-08-14 DIAGNOSIS — E890 Postprocedural hypothyroidism: Secondary | ICD-10-CM | POA: Insufficient documentation

## 2023-08-14 DIAGNOSIS — E785 Hyperlipidemia, unspecified: Secondary | ICD-10-CM | POA: Diagnosis not present

## 2023-08-14 DIAGNOSIS — E1165 Type 2 diabetes mellitus with hyperglycemia: Secondary | ICD-10-CM | POA: Diagnosis not present

## 2023-08-14 DIAGNOSIS — M65332 Trigger finger, left middle finger: Secondary | ICD-10-CM | POA: Diagnosis not present

## 2023-08-14 DIAGNOSIS — Z8249 Family history of ischemic heart disease and other diseases of the circulatory system: Secondary | ICD-10-CM | POA: Insufficient documentation

## 2023-08-14 DIAGNOSIS — E1169 Type 2 diabetes mellitus with other specified complication: Secondary | ICD-10-CM | POA: Insufficient documentation

## 2023-08-14 DIAGNOSIS — G4733 Obstructive sleep apnea (adult) (pediatric): Secondary | ICD-10-CM | POA: Insufficient documentation

## 2023-08-14 DIAGNOSIS — I10 Essential (primary) hypertension: Secondary | ICD-10-CM | POA: Insufficient documentation

## 2023-08-14 DIAGNOSIS — J45909 Unspecified asthma, uncomplicated: Secondary | ICD-10-CM | POA: Insufficient documentation

## 2023-08-14 DIAGNOSIS — Z794 Long term (current) use of insulin: Secondary | ICD-10-CM | POA: Insufficient documentation

## 2023-08-14 HISTORY — PX: TRIGGER FINGER RELEASE: SHX641

## 2023-08-14 LAB — BASIC METABOLIC PANEL
Anion gap: 8 (ref 5–15)
BUN: 14 mg/dL (ref 6–20)
CO2: 24 mmol/L (ref 22–32)
Calcium: 8.7 mg/dL — ABNORMAL LOW (ref 8.9–10.3)
Chloride: 107 mmol/L (ref 98–111)
Creatinine, Ser: 0.87 mg/dL (ref 0.44–1.00)
GFR, Estimated: >60 mL/min (ref >60)
Glucose, Bld: 122 mg/dL — ABNORMAL HIGH (ref 70–99)
Potassium: 4.1 mmol/L (ref 3.5–5.1)
Sodium: 139 mmol/L (ref 135–145)

## 2023-08-14 LAB — CBC
HCT: 38.9 % (ref 36.0–46.0)
Hemoglobin: 12.4 g/dL (ref 12.0–15.0)
MCH: 28.2 pg (ref 26.0–34.0)
MCHC: 31.9 g/dL (ref 30.0–36.0)
MCV: 88.6 fL (ref 80.0–100.0)
Platelets: 253 10*3/uL (ref 150–400)
RBC: 4.39 MIL/uL (ref 3.87–5.11)
RDW: 15.4 % (ref 11.5–15.5)
WBC: 7.8 10*3/uL (ref 4.0–10.5)
nRBC: 0 % (ref 0.0–0.2)

## 2023-08-14 LAB — NO BLOOD PRODUCTS

## 2023-08-14 LAB — GLUCOSE, CAPILLARY
Glucose-Capillary: 130 mg/dL — ABNORMAL HIGH (ref 70–99)
Glucose-Capillary: 110 mg/dL — ABNORMAL HIGH (ref 70–99)

## 2023-08-14 SURGERY — RELEASE, A1 PULLEY, FOR TRIGGER FINGER
Anesthesia: Monitor Anesthesia Care | Laterality: Left

## 2023-08-14 MED ORDER — LACTATED RINGERS IV SOLN
INTRAVENOUS | Status: DC | PRN
Start: 2023-08-14 — End: 2023-08-14

## 2023-08-14 MED ORDER — LIDOCAINE HCL (PF) 1 % IJ SOLN
INTRAMUSCULAR | Status: DC | PRN
Start: 2023-08-14 — End: 2023-08-14
  Administered 2023-08-14: 10 mL

## 2023-08-14 MED ORDER — CEFAZOLIN IN SODIUM CHLORIDE 3-0.9 GM/100ML-% IV SOLN
3.0000 g | INTRAVENOUS | Status: AC
  Administered 2023-08-14: 3 g via INTRAVENOUS
  Filled 2023-08-14: qty 100

## 2023-08-14 MED ORDER — PROPOFOL 10 MG/ML IV BOLUS
INTRAVENOUS | Status: DC | PRN
  Administered 2023-08-14 (×2): 50 mg via INTRAVENOUS

## 2023-08-14 MED ORDER — PROPOFOL 500 MG/50ML IV EMUL
INTRAVENOUS | Status: DC | PRN
  Administered 2023-08-14: 25 ug/kg/min via INTRAVENOUS

## 2023-08-14 MED ORDER — PROPOFOL 1000 MG/100ML IV EMUL
INTRAVENOUS | Status: AC
  Filled 2023-08-14: qty 100

## 2023-08-14 MED ORDER — LIDOCAINE HCL (PF) 1 % IJ SOLN
INTRAMUSCULAR | Status: AC
  Filled 2023-08-14: qty 30

## 2023-08-14 MED ORDER — GLYCOPYRROLATE 0.2 MG/ML IJ SOLN
INTRAMUSCULAR | Status: DC | PRN
Start: 2023-08-14 — End: 2023-08-14
  Administered 2023-08-14: .1 mg via INTRAVENOUS

## 2023-08-14 MED ORDER — INSULIN ASPART 100 UNIT/ML IJ SOLN: 0.0000 [IU] | INTRAMUSCULAR | Status: DC | PRN

## 2023-08-14 MED ORDER — LACTATED RINGERS IV SOLN: INTRAVENOUS | Status: DC

## 2023-08-14 MED ORDER — PHENYLEPHRINE 80 MCG/ML (10ML) SYRINGE FOR IV PUSH (FOR BLOOD PRESSURE SUPPORT)
PREFILLED_SYRINGE | INTRAVENOUS | Status: DC | PRN
  Administered 2023-08-14: 80 ug via INTRAVENOUS

## 2023-08-14 MED ORDER — MIDAZOLAM HCL 2 MG/2ML IJ SOLN
INTRAMUSCULAR | Status: AC
  Filled 2023-08-14: qty 2

## 2023-08-14 MED ORDER — CHLORHEXIDINE GLUCONATE 0.12 % MT SOLN
OROMUCOSAL | Status: AC
  Administered 2023-08-14: 15 mL via OROMUCOSAL
  Filled 2023-08-14: qty 15

## 2023-08-14 MED ORDER — BUPIVACAINE HCL (PF) 0.25 % IJ SOLN
INTRAMUSCULAR | Status: AC
  Filled 2023-08-14: qty 30

## 2023-08-14 MED ORDER — ORAL CARE MOUTH RINSE: 15.0000 mL | Freq: Once | OROMUCOSAL | Status: AC

## 2023-08-14 MED ORDER — MIDAZOLAM HCL 2 MG/2ML IJ SOLN
INTRAMUSCULAR | Status: DC | PRN
  Administered 2023-08-14: 2 mg via INTRAVENOUS

## 2023-08-14 MED ORDER — DEXMEDETOMIDINE HCL IN NACL 80 MCG/20ML IV SOLN
INTRAVENOUS | Status: DC | PRN
Start: 2023-08-14 — End: 2023-08-14
  Administered 2023-08-14 (×2): 8 ug via INTRAVENOUS
  Administered 2023-08-14: 4 ug via INTRAVENOUS

## 2023-08-14 MED ORDER — ONDANSETRON HCL 4 MG/2ML IJ SOLN
INTRAMUSCULAR | Status: AC
  Filled 2023-08-14: qty 2

## 2023-08-14 MED ORDER — KETAMINE HCL 50 MG/5ML IJ SOSY
PREFILLED_SYRINGE | INTRAMUSCULAR | Status: AC
  Filled 2023-08-14: qty 5

## 2023-08-14 MED ORDER — LIDOCAINE 2% (20 MG/ML) 5 ML SYRINGE
INTRAMUSCULAR | Status: DC | PRN
  Administered 2023-08-14: 20 mg via INTRAVENOUS

## 2023-08-14 MED ORDER — KETAMINE HCL 10 MG/ML IJ SOLN
INTRAMUSCULAR | Status: DC | PRN
Start: 2023-08-14 — End: 2023-08-14
  Administered 2023-08-14: 20 mg via INTRAVENOUS

## 2023-08-14 MED ORDER — CHLORHEXIDINE GLUCONATE 0.12 % MT SOLN: 15.0000 mL | Freq: Once | OROMUCOSAL | Status: AC

## 2023-08-14 MED ORDER — PHENYLEPHRINE HCL-NACL 20-0.9 MG/250ML-% IV SOLN
INTRAVENOUS | Status: AC
  Filled 2023-08-14: qty 250

## 2023-08-14 SURGICAL SUPPLY — 52 items
BAG COUNTER SPONGE SURGICOUNT (BAG) ×1 IMPLANT
BAG SPNG CNTER NS LX DISP (BAG) ×1
BNDG CMPR 5X3 KNIT ELC UNQ LF (GAUZE/BANDAGES/DRESSINGS)
BNDG CMPR 5X4 KNIT ELC UNQ LF (GAUZE/BANDAGES/DRESSINGS) ×2
BNDG COHESIVE 1X5 TAN STRL LF (GAUZE/BANDAGES/DRESSINGS) IMPLANT
BNDG ELASTIC 3INX 5YD STR LF (GAUZE/BANDAGES/DRESSINGS) IMPLANT
BNDG ELASTIC 4INX 5YD STR LF (GAUZE/BANDAGES/DRESSINGS) IMPLANT
BNDG ELASTIC 4X5.8 VLCR STR LF (GAUZE/BANDAGES/DRESSINGS) IMPLANT
BNDG GAUZE DERMACEA FLUFF 4 (GAUZE/BANDAGES/DRESSINGS) ×2 IMPLANT
BNDG GZE DERMACEA 4 6PLY (GAUZE/BANDAGES/DRESSINGS) ×2
CORD BIPOLAR FORCEPS 12FT (ELECTRODE) ×1 IMPLANT
COVER SURGICAL LIGHT HANDLE (MISCELLANEOUS) ×1 IMPLANT
CUFF TOURN SGL QUICK 18X4 (TOURNIQUET CUFF) IMPLANT
CUFF TOURN SGL QUICK 24 (TOURNIQUET CUFF) ×1
CUFF TRNQT CYL 24X4X16.5-23 (TOURNIQUET CUFF) IMPLANT
DRAPE SURG 17X23 STRL (DRAPES) ×1 IMPLANT
GAUZE SPONGE 2X2 8PLY STRL LF (GAUZE/BANDAGES/DRESSINGS) IMPLANT
GAUZE SPONGE 4X4 12PLY STRL (GAUZE/BANDAGES/DRESSINGS) IMPLANT
GAUZE SPONGE 4X4 12PLY STRL LF (GAUZE/BANDAGES/DRESSINGS) IMPLANT
GAUZE XEROFORM 1X8 LF (GAUZE/BANDAGES/DRESSINGS) IMPLANT
GLOVE BIO SURGEON STRL SZ7.5 (GLOVE) IMPLANT
GLOVE BIOGEL M 8.0 STRL (GLOVE) ×1 IMPLANT
GLOVE BIOGEL PI IND STRL 7.5 (GLOVE) IMPLANT
GLOVE SS BIOGEL STRL SZ 8 (GLOVE) ×1 IMPLANT
GOWN STRL REUS W/ TWL LRG LVL3 (GOWN DISPOSABLE) ×2 IMPLANT
GOWN STRL REUS W/ TWL XL LVL3 (GOWN DISPOSABLE) ×3 IMPLANT
GOWN STRL REUS W/TWL LRG LVL3 (GOWN DISPOSABLE) ×2
GOWN STRL REUS W/TWL XL LVL3 (GOWN DISPOSABLE)
KIT BASIN OR (CUSTOM PROCEDURE TRAY) ×1 IMPLANT
KIT TURNOVER KIT B (KITS) ×1 IMPLANT
MANIFOLD NEPTUNE II (INSTRUMENTS) ×1 IMPLANT
NDL HYPO 25GX1X1/2 BEV (NEEDLE) IMPLANT
NEEDLE HYPO 25GX1X1/2 BEV (NEEDLE) ×1
NS IRRIG 1000ML POUR BTL (IV SOLUTION) ×1 IMPLANT
PACK ORTHO EXTREMITY (CUSTOM PROCEDURE TRAY) ×1 IMPLANT
PAD ARMBOARD 7.5X6 YLW CONV (MISCELLANEOUS) ×2 IMPLANT
PAD CAST 4YDX4 CTTN HI CHSV (CAST SUPPLIES) IMPLANT
PADDING CAST COTTON 4X4 STRL (CAST SUPPLIES)
SOL PREP POV-IOD 4OZ 10% (MISCELLANEOUS) ×3 IMPLANT
SPECIMEN JAR SMALL (MISCELLANEOUS) ×1 IMPLANT
SPIKE FLUID TRANSFER (MISCELLANEOUS) ×1 IMPLANT
SUCTION TUBE FRAZIER 10FR DISP (SUCTIONS) IMPLANT
SUT MERSILENE 4 0 P 3 (SUTURE) IMPLANT
SUT PROLENE 4 0 PS 2 18 (SUTURE) IMPLANT
SUT VIC AB 2-0 CT1 27 (SUTURE)
SUT VIC AB 2-0 CT1 TAPERPNT 27 (SUTURE) IMPLANT
SYR CONTROL 10ML LL (SYRINGE) IMPLANT
TOWEL GREEN STERILE (TOWEL DISPOSABLE) ×1 IMPLANT
TOWEL GREEN STERILE FF (TOWEL DISPOSABLE) ×1 IMPLANT
TUBE CONNECTING 12X1/4 (SUCTIONS) IMPLANT
UNDERPAD 30X36 HEAVY ABSORB (UNDERPADS AND DIAPERS) ×1 IMPLANT
WATER STERILE IRR 1000ML POUR (IV SOLUTION) ×1 IMPLANT

## 2023-08-14 NOTE — Anesthesia Postprocedure Evaluation (Signed)
Anesthesia Post Note  Patient: Vanessa [name]  Procedure(s) Performed: LEFT MIDDLE DIGIT RELEASE TRIGGER FINGER/A-1 PULLEY (Left)     Patient location during evaluation: PACU Anesthesia Type: MAC Level of consciousness: awake and alert Pain management: pain level controlled Vital Signs Assessment: post-procedure vital signs reviewed and stable Respiratory status: spontaneous breathing, nonlabored ventilation, respiratory function stable and patient connected to nasal cannula oxygen Cardiovascular status: stable and blood pressure returned to baseline Postop Assessment: no apparent nausea or vomiting Anesthetic complications: no   No notable events documented.  Last Vitals:  Vitals:   08/14/23 0830 08/14/23 0845  BP: 124/63 118/80  Pulse: (!) 59 (!) 57  Resp: (!) 26 (!) 23  Temp:  36.5 C  SpO2: 94% 93%    Last Pain:  Vitals:   08/14/23 0845  TempSrc:   PainSc: 0-No pain                 Ellice Boultinghouse
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
## 2023-08-14 NOTE — Transfer of Care (Signed)
Immediate Anesthesia Transfer of Care Note  Patient: Vanessa [name]  Procedure(s) Performed: LEFT MIDDLE DIGIT RELEASE TRIGGER FINGER/A-1 PULLEY (Left)  Patient Location: PACU  Anesthesia Type:MAC  Level of Consciousness: awake, alert , and oriented  Airway & Oxygen Therapy: Patient Spontanous Breathing  Post-op Assessment: Report given to RN, Post -op Vital signs reviewed and stable, and Patient moving all extremities  Post vital signs: Reviewed and stable  Last Vitals:  Vitals Value Taken Time  BP 124/69 08/14/23 0821  Temp    Pulse 72 08/14/23 0821  Resp 28 08/14/23 0821  SpO2 94 % 08/14/23 0821  Vitals shown include unfiled device data.  Last Pain:  Vitals:   08/14/23 0608  TempSrc: Oral  PainSc:          Complications: No notable events documented.

## 2023-08-14 NOTE — Progress Notes (Signed)
Orthopedic Tech Progress Note Patient Details:  Vanessa [name] 10-17-1971 [id number]  Ortho Devices Type of Ortho Device: Arm sling Ortho Device/Splint Location: LUE Ortho Device/Splint Interventions: Application, Ordered   Post Interventions Patient Tolerated: Well  Koray Soter A Meir Elwood 08/14/2023, 9:19 AM

## 2023-08-14 NOTE — Op Note (Signed)
NAME: Vanessa [name] MEDICAL RECORD NO: [id number] DATE OF BIRTH: Mar 11, 1971 FACILITY: [facility] LOCATION: [facility] OR PHYSICIAN: Samuella Cota, MD   OPERATIVE REPORT   DATE OF PROCEDURE: 08/14/23    PREOPERATIVE DIAGNOSIS: Left long finger trigger digit   POSTOPERATIVE DIAGNOSIS: Left long finger trigger digit   PROCEDURE: .  Left long finger trigger digit release   SURGEON:  Samuella Cota, M.D.   ASSISTANT: None   ANESTHESIA:  Local with sedation   INTRAVENOUS FLUIDS:  Per anesthesia flow sheet.   ESTIMATED BLOOD LOSS:  Minimal.   COMPLICATIONS:  None.   SPECIMENS:  none   TOURNIQUET TIME:    Total Tourniquet Time Documented: Upper Arm (Left) - 8 minutes Total: Upper Arm (Left) - 8 minutes    DISPOSITION:  Stable to PACU.   INDICATIONS: This is a 52 year old female seen in the outpatient setting and found to have left long finger trigger digit release with significant stenosing tenosynovitis.  Patient was indicated for left long finger trigger digit release.  Risks and benefits of surgery were discussed including the risks of infection, bleeding, scarring, stiffness, nerve injury, vascular injury, tendon injury, need for subsequent operation, , recurrence.  She voiced understanding of these risks and elected to proceed.  OPERATIVE COURSE: Patient was seen and identified in the preoperative area and marked appropriately.  Surgical consent had been signed. Preoperative IV antibiotic prophylaxis was given. She was transferred to the operating room and placed in supine position with the Left upper extremity on an arm board.  Sedation was induced by the anesthesiologist.10 cc of 1% lidocaine plain was utilized for anesthetic purposes at the planned incisional site.  Left upper extremity was prepped and draped in normal sterile orthopedic fashion.  A surgical pause was performed between the surgeons, anesthesia, and operating room staff and all were in agreement as to the patient,  procedure, and site of procedure.  Tourniquet was placed and padded appropriately to the left upper arm.  A 2 cm oblique incision was designed at the base of the long finger.  This incision was carried down to the subcutaneous tissues.  The A1 pulley was identified and sharply divided utilizing a Beaver blade.  Tenotomy scissors were then used to confirm complete release of the A1 pulley.  The proximal portion of the A2 pulley was also released, not exceeding 25% of its substance.  Following tendon sheath incision, traction tenolysis of both FDP and FDS tendons was performed utilizing Ragnell retractors.  Smooth gliding to the tendon surface was noted.  The adherent flexor tenosynovial tissues were identified, sharply divided and sharply excised.    Patient was lightened from sedation at this juncture and asked to flex and extend the long finger to confirm no residual clicking or locking.  Tourniquet was subsequently deflated, bipolar electrocautery was utilized for hemostasis.  Copious irrigation was performed.  Wound was closed utilizing 4-0 nylon in horizontal mattress fashion.  I will see her back in the office in  2 weeks  for postoperative followup.     Samuella Cota, MD Electronically signed, 08/14/23

## 2023-08-14 NOTE — Discharge Instructions (Signed)
    Hand Surgery Postop Instructions    Dressings: Maintain postoperative dressing for 5 days.   After 5 days, it is okay to unwrap postoperative dressing and apply Band-Aid or rewrap.   Keep operative site clean and dry until orthopedic follow-up.  Wound Care: Keep your hand elevated above the level of your heart.  Do not allow it to dangle by your side. Moving your fingers is advised to stimulate circulation but will depend on the site of your surgery.  If you have a splint applied, your doctor will advise you regarding movement.  Activity: Do not drive or operate machinery until clearance given from physician. No heavy lifting with operative extremity.  Diet:  Drink liquids today or eat a light diet.  You may resume a regular diet tomorrow.    General expectations: Pain for two to three days. Take prescribed medication if given, transition to over-the-counter medication as quickly as possible. Fingers may become slightly swollen.  Call your doctor if any of the following occur: Severe pain not relieved by pain medication. Elevated temperature. Dressing soaked with blood. Inability to move fingers. White or bluish color to fingers.   Ashlynn Gunnels Trevor Mace, M.D. Hand Surgery [facility]

## 2023-08-15 ENCOUNTER — Encounter (HOSPITAL_COMMUNITY): Payer: Self-pay | Admitting: Orthopedic Surgery

## 2023-08-17 ENCOUNTER — Encounter: Payer: Medicaid Other | Admitting: Orthopedic Surgery

## 2023-08-18 ENCOUNTER — Encounter: Payer: Self-pay | Admitting: Internal Medicine

## 2023-08-18 ENCOUNTER — Ambulatory Visit: Payer: Medicaid Other | Admitting: Internal Medicine

## 2023-08-18 VITALS — BP 138/74 | HR 75 | Ht 62.0 in | Wt 363.4 lb

## 2023-08-18 DIAGNOSIS — E1165 Type 2 diabetes mellitus with hyperglycemia: Secondary | ICD-10-CM | POA: Diagnosis not present

## 2023-08-18 DIAGNOSIS — E1169 Type 2 diabetes mellitus with other specified complication: Secondary | ICD-10-CM | POA: Diagnosis not present

## 2023-08-18 DIAGNOSIS — E039 Hypothyroidism, unspecified: Secondary | ICD-10-CM | POA: Diagnosis not present

## 2023-08-18 DIAGNOSIS — Z794 Long term (current) use of insulin: Secondary | ICD-10-CM

## 2023-08-18 DIAGNOSIS — E785 Hyperlipidemia, unspecified: Secondary | ICD-10-CM | POA: Diagnosis not present

## 2023-08-18 LAB — POCT GLYCOSYLATED HEMOGLOBIN (HGB A1C): Hemoglobin A1C: 6.4 % — AB (ref 4.0–5.6)

## 2023-08-18 NOTE — Patient Instructions (Signed)
Please continue: - Toujeo 62 units daily in am  Please continue Levothyroxine 200 mcg daily.  Take the thyroid hormone every day, with water, at least 30 minutes before breakfast, separated by at least 4 hours from: - acid reflux medications - calcium - iron - multivitamins  Please stop at the lab.  Please return in 3 months.
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
## 2023-08-28 ENCOUNTER — Ambulatory Visit (INDEPENDENT_AMBULATORY_CARE_PROVIDER_SITE_OTHER): Payer: Medicaid Other | Admitting: Orthopedic Surgery

## 2023-08-28 DIAGNOSIS — M65332 Trigger finger, left middle finger: Secondary | ICD-10-CM | POA: Diagnosis not present

## 2023-08-28 NOTE — Progress Notes (Signed)
Vanessa [name] - 52 y.o. female MRN [id number]  Date of birth: 01-Apr-1971  Office Visit Note: Visit Date: 08/28/2023 PCP: Corwin Levins, MD Referred by: Corwin Levins, MD  Subjective:  HPI: Vanessa [name] is a 52 y.o. female who presents today for follow up 2 weeks status post left middle trigger release.  She is doing very well postoperatively, does have some ongoing stiffness.  Pain is well-controlled.  No residual clicking or locking.  Pertinent ROS were reviewed with the patient and found to be negative unless otherwise specified above in HPI.   Assessment & Plan: Visit Diagnoses: No diagnosis found.  Plan: Sutures removed today.  She should continue with range of motion exercises.  We will have her seen by occupational therapy in order to help facilitate range of motion exercises with transition to home exercise program as tolerated.  Follow-up in approximately 4 to 6 weeks to track progress.  Follow-up: No follow-ups on file.   Meds & Orders: No orders of the defined types were placed in this encounter.  No orders of the defined types were placed in this encounter.    Procedures: No procedures performed       Objective:   Vital Signs: LMP  (LMP Unknown)   Ortho Exam Left long finger - Well-healed incision at the base over the A1 pulley, sutures in place, skin is well-approximated, no erythema or drainage - No residual clicking or locking with deep flexion of the digit - Hand is warm well-perfused, digital sensation preserved  Imaging: No results found.   Adysen Raphael Trevor Mace, M.D. [facility] 10:35 AM

## 2023-08-31 ENCOUNTER — Telehealth: Payer: Self-pay

## 2023-08-31 DIAGNOSIS — E039 Hypothyroidism, unspecified: Secondary | ICD-10-CM

## 2023-08-31 NOTE — Telephone Encounter (Signed)
I called and spoke with the patient. She has people that bring her,so she was unable to stay and wait. Her orders have been reordered. She will come on Wednesday..  Please put patient on the lab schedule.

## 2023-08-31 NOTE — Telephone Encounter (Signed)
Received this message for Dr. Elvera Lennox:This patient has very uncontrolled hypothyroidism.  She was just here for a visit but did not stop at the lab.  Can you please have her back for labs?  Will need a TSH and free T4 but these were ordered from before so she should not need any new orders.  Ty!

## 2023-09-02 ENCOUNTER — Other Ambulatory Visit (INDEPENDENT_AMBULATORY_CARE_PROVIDER_SITE_OTHER): Payer: Medicaid Other

## 2023-09-02 DIAGNOSIS — E039 Hypothyroidism, unspecified: Secondary | ICD-10-CM | POA: Diagnosis not present

## 2023-09-02 LAB — TSH: TSH: 0.25 u[IU]/mL — ABNORMAL LOW (ref 0.35–5.50)

## 2023-09-02 LAB — T4, FREE: Free T4: 1.66 ng/dL — ABNORMAL HIGH (ref 0.60–1.60)

## 2023-09-03 MED ORDER — LEVOTHYROXINE SODIUM 175 MCG PO TABS
175.0000 ug | ORAL_TABLET | Freq: Every day | ORAL | 3 refills | Status: DC
Start: 2023-09-03 — End: 2023-11-18

## 2023-09-03 NOTE — Addendum Note (Signed)
Addended by: Carlus Pavlov on: 09/03/2023 12:53 PM   Modules accepted: Orders

## 2023-09-04 DIAGNOSIS — G4733 Obstructive sleep apnea (adult) (pediatric): Secondary | ICD-10-CM | POA: Diagnosis not present

## 2023-09-09 ENCOUNTER — Ambulatory Visit: Payer: Medicaid Other | Admitting: Neurology

## 2023-09-09 DIAGNOSIS — G4733 Obstructive sleep apnea (adult) (pediatric): Secondary | ICD-10-CM

## 2023-09-09 DIAGNOSIS — G4731 Primary central sleep apnea: Secondary | ICD-10-CM

## 2023-09-10 ENCOUNTER — Telehealth: Payer: Self-pay | Admitting: *Deleted

## 2023-09-10 NOTE — Procedures (Signed)
[facility]  HOME SLEEP TEST (Watch PAT) REPORT  STUDY DATE: 09/09/23  DOB: Sep 09, 1971  MRN: [id number]  ORDERING CLINICIAN: Huston Foley, MD, PhD   REFERRING CLINICIAN: Butch Penny, NP  CLINICAL INFORMATION/HISTORY: 52 year old female with an underlying medical history of migraine headaches, anemia, arthritis, asthma, depression, diabetes, hypertension, sciatica, allergies, hypothyroidism, and morbid obesity with a BMI of over 60, who was previously diagnosed with obstructive sleep apnea. She has not been consistent with her autoPAP of 7 to 14 cm and presents for re-establishing her diagnosis, in order to restart PAP therapy.   Epworth sleepiness score: 19/24.  BMI: 66.5 kg/m  FINDINGS:   Sleep Summary:   Total Recording Time (hours, min): 6 hours, 34 min  Total Sleep Time (hours, min):  4 hours, 48 min  Percent REM (%):    23.3%   Respiratory Indices:   Calculated pAHI (per hour):  39.1/hour         REM pAHI:    42.5/hour       NREM pAHI: 38/hour  Central pAHI: 6.9/hour  Oxygen Saturation Statistics:    Oxygen Saturation (%) Mean: 95%   Minimum oxygen saturation (%):                 77%   O2 Saturation Range (%): 77 - 100%    O2 Saturation (minutes) <=88%: 0.3 min  Pulse Rate Statistics:   Pulse Mean (bpm):    64/min    Pulse Range (42 - 92/min)   IMPRESSION:   OSA (obstructive sleep apnea), severe Central Sleep Apnea  RECOMMENDATION:  This home sleep test demonstrates severe obstructive sleep apnea with a total AHI of 39.1/hour and O2 nadir of 77%. There was a mild central sleep apnea component as well. Snoring was detected, and appeared variable. Sleep appeared interrupted. Ongoing treatment with a positive airway pressure device is highly recommended. The patient will be advised to restart her autoPAP of 7-14 cm via mask of choice, sized to fit, EPR of 2 or as per patient tolerance. A laboratory attended titration study can be  considered in the future for optimization of treatment settings and to improve tolerance and compliance, if needed, down the road. Alternative treatment options are limited secondary to the severity of the patient's sleep disordered breathing, but may include surgical treatment with an implantable hypoglossal nerve stimulator (in carefully selected candidates, meeting criteria).  Concomitant weight loss is recommended (where clinically appropriate). Please note, that untreated obstructive sleep apnea may carry additional perioperative morbidity. Patients with significant obstructive sleep apnea should receive perioperative PAP therapy and the surgeons and particularly the anesthesiologist should be informed of the diagnosis and the severity of the sleep disordered breathing. The patient should be cautioned not to drive, work at heights, or operate dangerous or heavy equipment when tired or sleepy. Review and reiteration of good sleep hygiene measures should be pursued with any patient. Other causes of the patient's symptoms, including circadian rhythm disturbances, an underlying mood disorder, medication effect and/or an underlying medical problem cannot be ruled out based on this test. Clinical correlation is recommended.  The patient and her referring provider will be notified of the test results. The patient will be seen in follow up in sleep clinic at [facility].  I certify that I have reviewed the raw data recording prior to the issuance of this report in accordance with the standards of the American Academy of Sleep Medicine (AASM).    INTERPRETING PHYSICIAN:   Huston Foley, MD,  PhD Medical Director, [facility]) Diplomat, ABPN (Neurology and Sleep)   [facility] [address] [place], [place] [address] [phone]

## 2023-09-10 NOTE — Telephone Encounter (Signed)
Spoke to patient will start autopap again and will use same machine she currently has. Pt has f/u appointment 01/2024  Will send orders to [facility] this afternoon  Pt states will restart pap machine tomorrow night . Pt thanked me for calling

## 2023-09-10 NOTE — Addendum Note (Signed)
Addended by: Enedina Finner on: 09/10/2023 04:24 PM   Modules accepted: Orders

## 2023-09-10 NOTE — Telephone Encounter (Signed)
-----   Message from [facility] sent at 09/10/2023  4:24 PM EST ----- Restart her autoPAP of 7-14 cm via mask of choice, sized to fit, EPR of 2 or as per patient tolerance. Order placed

## 2023-09-10 NOTE — Progress Notes (Signed)
See procedure note.

## 2023-09-11 ENCOUNTER — Telehealth: Payer: Self-pay | Admitting: Sports Medicine

## 2023-09-11 NOTE — Telephone Encounter (Signed)
Called patient to let her know about over-the-counter patches that she can get at local pharmacies. She was okay with this option.

## 2023-09-11 NOTE — Telephone Encounter (Signed)
Patient called asked if a Rx can be sent in for the Lidocaine patches. Patient asked if the Rx can be sent in today? The number to contact patient is [phone]

## 2023-09-14 ENCOUNTER — Other Ambulatory Visit: Payer: Self-pay

## 2023-09-14 ENCOUNTER — Ambulatory Visit: Payer: Medicaid Other | Attending: Sports Medicine | Admitting: Occupational Therapy

## 2023-09-14 DIAGNOSIS — M65332 Trigger finger, left middle finger: Secondary | ICD-10-CM | POA: Insufficient documentation

## 2023-09-14 DIAGNOSIS — M6281 Muscle weakness (generalized): Secondary | ICD-10-CM | POA: Insufficient documentation

## 2023-09-14 DIAGNOSIS — R208 Other disturbances of skin sensation: Secondary | ICD-10-CM | POA: Insufficient documentation

## 2023-09-14 DIAGNOSIS — R29898 Other symptoms and signs involving the musculoskeletal system: Secondary | ICD-10-CM | POA: Diagnosis present

## 2023-09-14 DIAGNOSIS — R278 Other lack of coordination: Secondary | ICD-10-CM | POA: Insufficient documentation
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
## 2023-09-14 NOTE — Telephone Encounter (Signed)
Ok I faxed her sleep study to [facility]. Received a receipt of confirmation.

## 2023-09-14 NOTE — Telephone Encounter (Signed)
Pt has called to report DME is asking for a copy of her sleep study to be faxed to them at [phone] so they can get needed parts to her.

## 2023-09-21 DIAGNOSIS — G4733 Obstructive sleep apnea (adult) (pediatric): Secondary | ICD-10-CM | POA: Diagnosis not present

## 2023-09-28 ENCOUNTER — Other Ambulatory Visit: Payer: Self-pay

## 2023-09-28 MED ORDER — TOUJEO MAX SOLOSTAR 300 UNIT/ML ~~LOC~~ SOPN: 62.0000 [IU] | PEN_INJECTOR | SUBCUTANEOUS | 3 refills | Status: DC

## 2023-09-30 ENCOUNTER — Telehealth: Payer: Self-pay

## 2023-09-30 ENCOUNTER — Other Ambulatory Visit: Payer: Self-pay

## 2023-09-30 MED ORDER — BASAGLAR KWIKPEN 100 UNIT/ML ~~LOC~~ SOPN: 62.0000 [IU] | PEN_INJECTOR | Freq: Every day | SUBCUTANEOUS | 1 refills | Status: DC

## 2023-09-30 NOTE — Telephone Encounter (Signed)
Requested Prescriptions   Signed Prescriptions Disp Refills   Insulin Glargine (BASAGLAR KWIKPEN) 100 UNIT/ML 90 mL 1    Sig: Inject 62 Units into the skin daily.    Authorizing Provider: Carlus Pavlov    Ordering User: Pollie Meyer

## 2023-09-30 NOTE — Telephone Encounter (Signed)
Patient insurance will not cover [place]. Please advise if ok to send in [place] or [place] and if so please advise on dosing?  Evaristo Bury [phone] or [employer]

## 2023-11-17 ENCOUNTER — Encounter: Payer: Self-pay | Admitting: Internal Medicine

## 2023-11-17 ENCOUNTER — Ambulatory Visit (INDEPENDENT_AMBULATORY_CARE_PROVIDER_SITE_OTHER): Payer: Medicaid Other | Admitting: Internal Medicine

## 2023-11-17 VITALS — BP 122/70 | HR 76 | Ht 62.0 in | Wt 365.6 lb

## 2023-11-17 DIAGNOSIS — E785 Hyperlipidemia, unspecified: Secondary | ICD-10-CM

## 2023-11-17 DIAGNOSIS — E1165 Type 2 diabetes mellitus with hyperglycemia: Secondary | ICD-10-CM

## 2023-11-17 DIAGNOSIS — E1169 Type 2 diabetes mellitus with other specified complication: Secondary | ICD-10-CM

## 2023-11-17 DIAGNOSIS — E039 Hypothyroidism, unspecified: Secondary | ICD-10-CM

## 2023-11-17 LAB — POCT GLYCOSYLATED HEMOGLOBIN (HGB A1C): Hemoglobin A1C: 6.3 % — AB (ref 4.0–5.6)

## 2023-11-17 NOTE — Patient Instructions (Addendum)
 Please continue off the insulin .  Please continue Levothyroxine  175 mcg daily.  Take the thyroid  hormone every day, with water , at least 30 minutes before breakfast, separated by at least 4 hours from: - acid reflux medications - calcium - iron - multivitamins  Please stop at the lab.  Please return in 4 months.
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
## 2023-11-18 LAB — TSH: TSH: 1.95 m[IU]/L

## 2023-11-18 LAB — T4, FREE: Free T4: 1.6 ng/dL (ref 0.8–1.8)

## 2023-11-18 MED ORDER — LEVOTHYROXINE SODIUM 175 MCG PO TABS: 175.0000 ug | ORAL_TABLET | Freq: Every day | ORAL | 3 refills | Status: AC

## 2023-11-18 NOTE — Addendum Note (Signed)
 Addended by: Emilie Harden on: 11/18/2023 11:08 AM   Modules accepted: Orders

## 2023-11-19 ENCOUNTER — Ambulatory Visit: Payer: Medicaid Other | Admitting: Internal Medicine

## 2023-11-19 NOTE — Patient Instructions (Addendum)
       Medications changes include :   paxlovid  twice a day x 5 days, cough syrup   HOLD - xanax, trazodone, tylenol #3    Return if symptoms worsen or fail to improve.

## 2023-11-19 NOTE — Progress Notes (Signed)
Subjective:    Patient ID: Vanessa [name], female    DOB: Oct 11, 1971, 53 y.o.   MRN: [id number]      HPI Vanessa [name] is here for  Chief Complaint  Patient presents with   Cough    Cough, congestion in chest; throat sore (Bad on Tuesday but better today);post nasal drainage; pain and discomfort in left ear; Started bad on Tuesday    She is here for an acute visit for cold symptoms.   Her symptoms started Monday, 4 days ago  She is experiencing decreased appetite, fever, fatigue, nasal congestion, left ear pain, PND, sinus pressure, sore throat, cough that is dry and sometimes productive, chest tightness, wheezing, shortness of breath, nausea, initially diarrhea, headaches, lightheadedness and dizziness.  She has tried taking neb treatments, nyquil   She is drinking a lot of water.    Medications and allergies reviewed with patient and updated if appropriate.  Current Outpatient Medications on File Prior to Visit  Medication Sig Dispense Refill   acetaminophen-codeine (TYLENOL #3) 300-30 MG tablet Take 1 tablet by mouth every 6 (six) hours as needed for moderate pain. 30 tablet 0   albuterol (ACCUNEB) 0.63 MG/3ML nebulizer solution Take 3 mLs (0.63 mg total) by nebulization every 6 (six) hours as needed for wheezing. 75 mL 12   albuterol (VENTOLIN HFA) 108 (90 Base) MCG/ACT inhaler Inhale 2 puffs into the lungs every 6 (six) hours as needed for wheezing or shortness of breath. 8 g 0   ALPRAZolam (XANAX) 0.5 MG tablet Take 0.5 mg by mouth at bedtime as needed for sleep.     beclomethasone (QVAR REDIHALER) 80 MCG/ACT inhaler Inhale 1 puff into the lungs 2 (two) times daily. (Patient taking differently: Inhale 1 puff into the lungs 2 (two) times daily as needed (asthma).) 1 each 2   blood glucose meter kit and supplies KIT Dispense based on patient and insurance preference. Use up to four times daily as directed. 1 each 0   cetirizine (ZYRTEC) 10 MG tablet Take 10 mg by mouth daily as  needed for allergies.     citalopram (CELEXA) 20 MG tablet TAKE 1 TABLET(20 MG) BY MOUTH DAILY 90 tablet 1   Continuous Blood Gluc Sensor (FREESTYLE LIBRE 2 SENSOR) MISC 1 Device by Does not apply route every 14 (fourteen) days. 6 each 3   cyclobenzaprine (FLEXERIL) 10 MG tablet Take 1 tablet (10 mg total) by mouth 2 (two) times daily as needed. 45 tablet 0   fluticasone (CUTIVATE) 0.05 % cream Apply 1 application. topically 2 (two) times daily as needed (irritation). Apply topically twice a day to irritation area 30 g 1   gabapentin (NEURONTIN) 300 MG capsule Take 2 capsules (600 mg total) by mouth 3 (three) times daily. (Patient taking differently: Take 600 mg by mouth 3 (three) times daily as needed (nerve pain).) 180 capsule 2   glucose blood (RELION PREMIER TEST) test strip Use as instructed once daily E11.9 100 each 12   hydrochlorothiazide (HYDRODIURIL) 25 MG tablet TAKE 1 TABLET(25 MG) BY MOUTH DAILY 90 tablet 1   hydrocortisone (ANUSOL-HC) 2.5 % rectal cream Place 1 application. rectally 2 (two) times daily. (Patient taking differently: Place 1 application  rectally 2 (two) times daily as needed for hemorrhoids.) 30 g 1   levothyroxine (SYNTHROID) 175 MCG tablet Take 1 tablet (175 mcg total) by mouth daily before breakfast. 90 tablet 3   losartan (COZAAR) 50 MG tablet Take 50 mg by mouth daily.  meloxicam (MOBIC) 15 MG tablet Take 1 tablet (15 mg total) by mouth daily. 30 tablet 0   mometasone-formoterol (DULERA) 200-5 MCG/ACT AERO Inhale 2 puffs into the lungs 2 (two) times daily. (Patient taking differently: Inhale 2 puffs into the lungs 2 (two) times daily as needed for wheezing or shortness of breath.) 13 g 11   montelukast (SINGULAIR) 10 MG tablet Take 1 tablet (10 mg total) by mouth daily. (Patient taking differently: Take 10 mg by mouth daily as needed (allergies).) 90 tablet 3   naproxen (NAPROSYN) 500 MG tablet Take 500 mg by mouth 4 (four) times daily as needed for moderate pain.      ReliOn Ultra Thin Lancets 30G MISC Use as directed once daily E11.9 100 each 11   rizatriptan (MAXALT-MLT) 10 MG disintegrating tablet Take 1 tablet (10 mg total) by mouth as needed for migraine. May repeat in 2 hours if needed 10 tablet 11   SUMAtriptan (IMITREX) 100 MG tablet TAKE 1 TABLET BY MOUTH AS NEEDED FOR MIGRAINE OR HEADACHE.* MAY REPEAT IN 2 HOURS IF NEEDED 10 tablet 11   topiramate (TOPAMAX) 100 MG tablet Take 1 tablet (100 mg total) by mouth 2 (two) times daily. (Patient taking differently: Take 100 mg by mouth at bedtime as needed (migraines).) 60 tablet 11   traZODone (DESYREL) 100 MG tablet 1/2 - 1 tab by mouth at bedtime as needed for sleep (Patient taking differently: Take 100 mg by mouth at bedtime as needed for sleep.) 90 tablet 1   triamcinolone (NASACORT) 55 MCG/ACT AERO nasal inhaler Place 2 sprays into the nose daily. (Patient taking differently: Place 2 sprays into the nose daily as needed (allergies).) 1 Inhaler 12   No current facility-administered medications on file prior to visit.    Review of Systems  Constitutional:  Positive for appetite change (dec), fatigue and fever.  HENT:  Positive for congestion, ear pain (left ear), postnasal drip, sinus pressure and sore throat.        Change in taste  Respiratory:  Positive for cough (sometimes dry, sometimes some mucus), chest tightness, shortness of breath and wheezing.   Gastrointestinal:  Positive for diarrhea (at first) and nausea.  Musculoskeletal:  Positive for myalgias.  Neurological:  Positive for dizziness, light-headedness and headaches.       Objective:   Vitals:   11/20/23 0939  BP: 126/76  Pulse: 69  Temp: 98.4 F (36.9 C)  SpO2: 96%   BP Readings from Last 3 Encounters:  11/20/23 126/76  11/17/23 122/70  08/18/23 138/74   Wt Readings from Last 3 Encounters:  11/20/23 (!) 365 lb (165.6 kg)  11/17/23 (!) 365 lb 9.6 oz (165.8 kg)  08/18/23 (!) 363 lb 6.4 oz (164.8 kg)   Body mass index  is 66.76 kg/m.    Physical Exam Constitutional:      General: She is not in acute distress.    Appearance: Normal appearance. She is not ill-appearing.  HENT:     Head: Normocephalic and atraumatic.     Right Ear: Tympanic membrane, ear canal and external ear normal.     Left Ear: Tympanic membrane, ear canal and external ear normal.     Mouth/Throat:     Mouth: Mucous membranes are moist.     Pharynx: No oropharyngeal exudate or posterior oropharyngeal erythema.  Eyes:     Conjunctiva/sclera: Conjunctivae normal.  Cardiovascular:     Rate and Rhythm: Normal rate and regular rhythm.  Pulmonary:     Effort:  Pulmonary effort is normal. No respiratory distress.     Breath sounds: Normal breath sounds. No wheezing or rales.  Musculoskeletal:     Cervical back: Neck supple. No tenderness.  Lymphadenopathy:     Cervical: No cervical adenopathy.  Skin:    General: Skin is warm and dry.  Neurological:     Mental Status: She is alert.        COVID test here is positive    Assessment & Plan:    COVID: Acute COVID test here today is positive Symptoms started 4 days ago, today is day 5 Start Paxlovid 3 tabs twice daily x 5 days Discussed possible side effects of Paxlovid.  Advised that she needs to hold alprazolam, trazodone and Tylenol 3 while taking the medication-she takes all these medications as needed so she has no problem holding them Hycodan cough syrup as needed Can take over-the-counter cold medications Continue drinking plenty of fluids Increase rest She will call with any questions or if her symptoms are not improving

## 2023-11-20 ENCOUNTER — Ambulatory Visit (INDEPENDENT_AMBULATORY_CARE_PROVIDER_SITE_OTHER): Payer: Medicaid Other | Admitting: Internal Medicine

## 2023-11-20 ENCOUNTER — Encounter: Payer: Self-pay | Admitting: Internal Medicine

## 2023-11-20 VITALS — BP 126/76 | HR 69 | Temp 98.4°F | Ht 62.0 in | Wt 365.0 lb

## 2023-11-20 DIAGNOSIS — U071 COVID-19: Secondary | ICD-10-CM | POA: Diagnosis not present

## 2023-11-20 LAB — POC COVID19 BINAXNOW: SARS Coronavirus 2 Ag: POSITIVE — AB

## 2023-11-20 MED ORDER — NIRMATRELVIR/RITONAVIR (PAXLOVID)TABLET: 3.0000 | ORAL_TABLET | Freq: Two times a day (BID) | ORAL | 0 refills | Status: AC

## 2023-11-20 MED ORDER — HYDROCODONE BIT-HOMATROP MBR 5-1.5 MG/5ML PO SOLN: 5.0000 mL | Freq: Three times a day (TID) | ORAL | 0 refills | Status: DC | PRN

## 2023-12-03 ENCOUNTER — Telehealth: Payer: Self-pay | Admitting: Sports Medicine

## 2023-12-03 NOTE — Telephone Encounter (Signed)
Pt called requesting Dr Shon Baton to refill out forms for her to get [employer] transportation to get to and from here house. [employer] is asking for Dr Shon Baton to be more detailed about reason pt need to be picked up from home and not the bus stop. Please call pt about this matter at [phone].

## 2023-12-11 ENCOUNTER — Ambulatory Visit (INDEPENDENT_AMBULATORY_CARE_PROVIDER_SITE_OTHER): Payer: Medicaid Other | Admitting: Internal Medicine

## 2023-12-11 ENCOUNTER — Encounter: Payer: Self-pay | Admitting: Internal Medicine

## 2023-12-11 VITALS — BP 126/78 | HR 70 | Temp 98.5°F | Ht 62.0 in

## 2023-12-11 DIAGNOSIS — I10 Essential (primary) hypertension: Secondary | ICD-10-CM | POA: Diagnosis not present

## 2023-12-11 DIAGNOSIS — R059 Cough, unspecified: Secondary | ICD-10-CM

## 2023-12-11 DIAGNOSIS — J069 Acute upper respiratory infection, unspecified: Secondary | ICD-10-CM

## 2023-12-11 DIAGNOSIS — E559 Vitamin D deficiency, unspecified: Secondary | ICD-10-CM

## 2023-12-11 DIAGNOSIS — E1165 Type 2 diabetes mellitus with hyperglycemia: Secondary | ICD-10-CM | POA: Diagnosis not present

## 2023-12-11 LAB — POC COVID19 BINAXNOW: SARS Coronavirus 2 Ag: NEGATIVE

## 2023-12-11 MED ORDER — AMOXICILLIN 500 MG PO CAPS: 500.0000 mg | ORAL_CAPSULE | Freq: Three times a day (TID) | ORAL | 0 refills | Status: AC

## 2023-12-11 MED ORDER — HYDROCODONE BIT-HOMATROP MBR 5-1.5 MG/5ML PO SOLN: 5.0000 mL | Freq: Three times a day (TID) | ORAL | 0 refills | Status: DC | PRN

## 2023-12-11 NOTE — Patient Instructions (Signed)
 Please take all new medication as prescribed - the antibiotic, and cough medicine  Please continue all other medications as before, and refills have been done if requested.  Please have the pharmacy call with any other refills you may need.  Please keep your appointments with your specialists as you may have planned - endocrinology
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
## 2023-12-13 ENCOUNTER — Encounter: Payer: Self-pay | Admitting: Internal Medicine

## 2023-12-13 NOTE — Assessment & Plan Note (Signed)
 Lab Results  Component Value Date   HGBA1C 6.3 (A) 11/17/2023   Stable, pt to continue current medical treatment  - diet, wt control

## 2023-12-13 NOTE — Assessment & Plan Note (Signed)
 Mild to mod, for antibx course amoxil  500 tid, cough med prn,,  to f/u any worsening symptoms or concerns

## 2023-12-13 NOTE — Assessment & Plan Note (Addendum)
 BP Readings from Last 3 Encounters:  12/11/23 126/78  11/20/23 126/76  11/17/23 122/70   Stable, pt to continue medical treatment hct 25 every day, losartan  50 qd

## 2023-12-13 NOTE — Assessment & Plan Note (Signed)
Last vitamin D Lab Results  Component Value Date   VD25OH 9.49 (L) 02/05/2023   Low, to start oral replacement

## 2023-12-25 DIAGNOSIS — G4733 Obstructive sleep apnea (adult) (pediatric): Secondary | ICD-10-CM | POA: Diagnosis not present
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
## 2024-01-11 ENCOUNTER — Encounter: Payer: Self-pay | Admitting: Adult Health

## 2024-01-11 ENCOUNTER — Ambulatory Visit: Payer: Medicaid Other | Admitting: Adult Health

## 2024-01-11 ENCOUNTER — Telehealth: Payer: Self-pay

## 2024-01-11 VITALS — BP 135/85 | HR 88 | Ht 62.0 in | Wt 354.0 lb

## 2024-01-11 DIAGNOSIS — E236 Other disorders of pituitary gland: Secondary | ICD-10-CM | POA: Diagnosis not present

## 2024-01-11 DIAGNOSIS — G4733 Obstructive sleep apnea (adult) (pediatric): Secondary | ICD-10-CM

## 2024-01-11 DIAGNOSIS — Q0700 Arnold-Chiari syndrome without spina bifida or hydrocephalus: Secondary | ICD-10-CM | POA: Diagnosis not present

## 2024-01-11 NOTE — Telephone Encounter (Signed)
 Called patient to discuss if she was wanting to discuss if she still wanted to treat her sleep apnea. Pt stated she doesn't want to use her CPAP machine due to her mask that keeps her up. Patient was deciding if she was returning her CPAP because it not helpful to her. Patient agreed to come to her office visit to discuss other options.

## 2024-01-11 NOTE — Patient Instructions (Addendum)
 Your Plan:  Restart CPAP therapy MRI brain  Blood work today If your symptoms worsen or you develop new symptoms please let us know.   Thank you for coming to see [place] at [facility]. I hope we have been able to provide you high quality care today.  You may receive a patient satisfaction survey over the next few weeks. We would appreciate your feedback and comments so that we may continue to improve ourselves and the health of our patients.

## 2024-01-12 LAB — FOLLICLE STIMULATING HORMONE: FSH: 5.8 m[IU]/mL

## 2024-01-12 LAB — GROWTH HORMONE: Growth Hormone: 0.3 ng/mL (ref 0.0–10.0)

## 2024-01-12 LAB — PROLACTIN: Prolactin: 5.4 ng/mL (ref 3.6–25.2)

## 2024-01-12 LAB — INSULIN-LIKE GROWTH FACTOR: Insulin-Like GF-1: 68 ng/mL (ref 65–216)

## 2024-01-12 LAB — LUTEINIZING HORMONE: LH: 7.3 m[IU]/mL

## 2024-01-13 ENCOUNTER — Encounter: Payer: Self-pay | Admitting: Adult Health

## 2024-01-14 NOTE — Telephone Encounter (Signed)
 Noted.  Lets find out from the patient if she wants to restart the process.

## 2024-01-14 NOTE — Progress Notes (Signed)
 Zott, Daylene Katayama, Fermin Schwab, RN; Nash, Tammy; Zott, [place], unfortunately this is not an option for the patient. She failed to meet her initial 90 day compliance and she was give a restart and an additional 90 days to meet compliance which she again failed to meet.  She has had the equipment since  04/01/2023 and the insurance has not been covering it due to the compliance issues.  We have discussed this with her several times and at this point her options are to private pay for the equipment or return it and restart the process with new office notes, testing and order.  I will give her a call to discuss this with her. Thank You     Previous Messages    ----- Message ----- From: Guy Begin, RN Sent: 01/13/2024   4:58 PM EDT To: Melvern Sample; Stacy Zott Subject: to keep machine and retry using                New order in epic  Briannia C. Sitzman Female, 53 y.o., 17-Aug-1971 MRN: [id number]   Thanks,  Andrey Campanile

## 2024-01-14 NOTE — Telephone Encounter (Signed)
 Have attached note to OV.    My Note Signed 9:31 AM  Addend   Delete   Copy  Zott, Daylene Katayama, Fermin Schwab, RN; Fraser, Tammy; Zott, [place], unfortunately this is not an option for the patient. She failed to meet her initial 90 day compliance and she was give a restart and an additional 90 days to meet compliance which she again failed to meet.  She has had the equipment since  04/01/2023 and the insurance has not been covering it due to the compliance issues.  We have discussed this with her several times and at this point her options are to private pay for the equipment or return it and restart the process with new office notes, testing and order.  I will give her a call to discuss this with her. Thank You      Previous Messages     ----- Message ----- From: Guy Begin, RN Sent: 01/13/2024   4:58 PM EDT To: Melvern Sample; Stacy Zott Subject: to keep machine and retry using                 New order in epic   Vanessa [name] Female, 53 y.o., 1971/07/10 MRN: [id number]     Thanks,   Vanessa [name]

## 2024-01-18 NOTE — Telephone Encounter (Signed)
 I called pt to followup on her last visit. I relayed that if she returns the machine, can restart the process all over again.  She was willing to do this.  She was seen 01/11/2024, her last sleep study was 09-2023.  Ok to repeat HST?

## 2024-01-18 NOTE — Addendum Note (Signed)
 Addended by: Guy Begin on: 01/18/2024 04:18 PM   Modules accepted: Orders

## 2024-01-18 NOTE — Telephone Encounter (Signed)
 yes

## 2024-01-19 ENCOUNTER — Telehealth: Payer: Self-pay | Admitting: Adult Health

## 2024-01-19 NOTE — Telephone Encounter (Signed)
 Sent community message to [facility].

## 2024-01-19 NOTE — Telephone Encounter (Signed)
 I called pt and let her know that she needs to return her machine,  then HST to be redone.  Order in place for this,  she was seen in OV 01/11/2024.  Her lab results were unremarkable. Her MRI is still pending authorization.  The facility will call her once done.

## 2024-01-19 NOTE — Telephone Encounter (Signed)
 I called pt (see other phone message).  But I did send message to [facility] about this process.

## 2024-01-19 NOTE — Telephone Encounter (Signed)
 I called pt after sending message to [facility].  She needs to take her machine back to [facility].

## 2024-01-19 NOTE — Telephone Encounter (Signed)
 Zott, Hennie Duos, RN; [place], [place] Yes, she would need to return her equipment to [place] and then restart the process with office notes, new testing, and order.     Previous Messages    ----- Message ----- From: Guy Begin, RN Sent: 01/19/2024  11:19 AM EDT To: Melvern Sample; Stacy Zott Subject: turn machine  in and start process over?      Vanessa [name] Female, 53 y.o., Feb 29, 1971 MRN: [id number]  New order in for HST, (my understanding if pt turns her machine in she can start process over).  She saw Megan NP 01/11/2024 , home sleep test ordered.    Just wanted to confirm.    [employer]

## 2024-01-19 NOTE — Telephone Encounter (Signed)
 [employer] medicaid Berkley Harvey: [id number] exp. 03/04/24 sent to [facility] for anesthesia [phone]

## 2024-01-19 NOTE — Telephone Encounter (Signed)
 Pt called wanting to know what the provider is going to recommend regarding her cpap machine, She would like to know if she should turn it in ? Will she be needing to take the sleep study again or what is advised.

## 2024-01-26 ENCOUNTER — Encounter

## 2024-01-28 ENCOUNTER — Ambulatory Visit: Admitting: Sports Medicine

## 2024-02-02 ENCOUNTER — Other Ambulatory Visit: Payer: Self-pay | Admitting: Internal Medicine

## 2024-02-02 ENCOUNTER — Ambulatory Visit: Admitting: Neurology

## 2024-02-02 DIAGNOSIS — G4733 Obstructive sleep apnea (adult) (pediatric): Secondary | ICD-10-CM | POA: Diagnosis not present

## 2024-02-03 ENCOUNTER — Other Ambulatory Visit: Payer: Self-pay

## 2024-02-03 ENCOUNTER — Encounter (HOSPITAL_COMMUNITY): Payer: Self-pay | Admitting: *Deleted

## 2024-02-03 NOTE — Procedures (Signed)
 [facility]  HOME SLEEP TEST (Watch PAT) REPORT  STUDY DATE: 02/02/2024  DOB: 11/19/1970  MRN: [id number]  ORDERING CLINICIAN: Huston Foley, MD, PhD   REFERRING CLINICIAN: Butch Penny, NP  CLINICAL INFORMATION/HISTORY: 53 year old female with an underlying medical history of migraine headaches, anemia, arthritis, asthma, depression, diabetes, hypertension, sciatica, allergies, hypothyroidism, and morbid obesity with a BMI of over 60, who presents for reevaluation of her obstructive sleep apnea.  She has not been fully compliant with PAP therapy and presents for reassessment and to restart PAP therapy.  BMI: 64.8 kg/m  FINDINGS:   Sleep Summary:   Total Recording Time (hours, min): 7 hours, 23 min  Total Sleep Time (hours, min):  5 hours, 40 min  Percent REM (%):    17.9%   Respiratory Indices:   Calculated pAHI (per hour):  17.1/hour         REM pAHI:    39.7/hour       NREM pAHI: 12.2/hour  Central pAHI: 2.1/hour  Oxygen Saturation Statistics:    Oxygen Saturation (%) Mean: 95%   Minimum oxygen saturation (%):                 74%   O2 Saturation Range (%): 74 - 100%    O2 Saturation (minutes) <=88%: 6.1 min  Pulse Rate Statistics:   Pulse Mean (bpm):    72/min    Pulse Range (54 - 97/min)   IMPRESSION: OSA (obstructive sleep apnea), moderate  RECOMMENDATION:  This home sleep test demonstrates moderate obstructive sleep apnea with a total AHI of 17.1/hour and O2 nadir of 74%.  Variable snoring was detected, ranging from mild to louder.  Restarting treatment with a positive airway pressure (PAP) device is recommended. The patient will be advised to restart her AutoPap therapy with a pressure setting of 7 to 14 cm with EPR of 2 or as per patient preference, mask of choice, sized to fit. A full night titration study may be considered to optimize treatment settings, monitor proper oxygen saturations and aid with improvement of tolerance  and adherence, if needed down the road. Alternative treatment options may include a dental device through dentistry or orthodontics in selected patients or Inspire (hypoglossal nerve stimulator) in carefully selected patients (meeting inclusion criteria).  Concomitant weight loss is recommended (where clinically appropriate). Please note that untreated obstructive sleep apnea may carry additional perioperative morbidity. Patients with significant obstructive sleep apnea should receive perioperative PAP therapy and the surgeons and particularly the anesthesiologist should be informed of the diagnosis and the severity of the sleep disordered breathing. The patient should be cautioned not to drive, work at heights, or operate dangerous or heavy equipment when tired or sleepy. Review and reiteration of good sleep hygiene measures should be pursued with any patient. Other causes of the patient's symptoms, including circadian rhythm disturbances, an underlying mood disorder, medication effect and/or an underlying medical problem cannot be ruled out based on this test. Clinical correlation is recommended.  The patient and her referring provider will be notified of the test results. The patient will be seen in follow up in sleep clinic at [facility].  I certify that I have reviewed the raw data recording prior to the issuance of this report in accordance with the standards of the American Academy of Sleep Medicine (AASM).  INTERPRETING PHYSICIAN:   Huston Foley, MD, PhD Medical Director, [facility]) Diplomat, ABPN (Neurology and Sleep)   [facility]  [address] [place], [place] [address] [phone]

## 2024-02-03 NOTE — Progress Notes (Signed)
 See procedure note.

## 2024-02-03 NOTE — Progress Notes (Addendum)
 PCP - Dr. Oliver Barre Cardiologist - None EKG - 08/14/23 Chest x-ray -  ECHO - None Cardiac Cath - None CPAP - Yes, will start using again just took another sleep study DM - No longer.  Not taking any meds for Fasting Blood Sugar:   Checks Blood Sugar:  No more Blood Thinner Instructions: None Aspirin Instructions: None ERAS Protcol - None COVID TEST- N/A  Anesthesia review: No  -------------  SDW INSTRUCTIONS:  Your procedure is scheduled on 0800. Please report to [facility] Main Entrance "A" at 0530 A.M., and check in at the Admitting office. Call this number if you have problems the morning of surgery: [phone]   Remember: Do not eat or drink after midnight the night before your surgery  Medications to take morning of surgery with a sip of water include: Celexa, Synthroid, Singulair   IF NEEDED: Zyrtc, flexeril, neurontin, hycodan, maxalt, imitrex, nebulizer, inhaler  May bring inhaler day of      The Morning of Surgery Do not wear jewelry, make-up or nail polish. Do not wear lotions, powders, or perfumes/colognes, or deodorant Do not bring valuables to the hospital. [facility] is not responsible for any belongings or valuables.  If you are a smoker, DO NOT Smoke 24 hours prior to surgery  Remember that you must have someone to transport you home after your surgery, and remain with you for 24 hours if you are discharged the same day.  Please bring cases for contacts, glasses, hearing aids, dentures or bridgework because it cannot be worn into surgery.   Patients discharged the day of surgery will not be allowed to drive home.   Please shower the NIGHT BEFORE/MORNING OF SURGERY (use antibacterial soap like DIAL soap if possible). Wear comfortable clothes the morning of surgery. Oral Hygiene is also important to reduce your risk of infection.  Remember - BRUSH YOUR TEETH THE MORNING OF SURGERY WITH YOUR REGULAR TOOTHPASTE  Patient denies shortness of breath,  fever, cough and chest pain.

## 2024-02-03 NOTE — Anesthesia Preprocedure Evaluation (Signed)
 Anesthesia Evaluation  Patient identified by MRN, date of birth, ID band Patient awake    Reviewed: Allergy & Precautions, NPO status , Patient's Chart, lab work & pertinent test results  History of Anesthesia Complications (+) history of anesthetic complications (woke up during shoulder surgery in 2003)  Airway Mallampati: II  TM Distance: >3 FB Neck ROM: Full   Comment: Previous grade I view with MAC 3, easy mask Dental  (+) Dental Advisory Given   Pulmonary neg shortness of breath, asthma (last used inhaler 2 days ago) , sleep apnea (currently not using CPAP) , neg COPD, neg recent URI   Pulmonary exam normal breath sounds clear to auscultation       Cardiovascular hypertension (HCTZ, losartan), Pt. on medications (-) angina (-) Past MI, (-) Cardiac Stents and (-) CABG (-) dysrhythmias + Valvular Problems/Murmurs  Rhythm:Regular Rate:Normal  HLD   Neuro/Psych  Headaches, neg Seizures PSYCHIATRIC DISORDERS Anxiety Depression    Arnold-Chiari malformation  Neuromuscular disease (cervical and lumbar radiculopathy, sciatica)    GI/Hepatic negative GI ROS, Neg liver ROS,,,  Endo/Other  diabetes, Type 2Hypothyroidism  Class 4 obesityCyst of pituitary gland  Renal/GU negative Renal ROS     Musculoskeletal  (+) Arthritis ,    Abdominal  (+) + obese  Peds  Hematology  (+) Blood dyscrasia, anemia Lab Results      Component                Value               Date                      WBC                      7.8                 08/14/2023                HGB                      12.4                08/14/2023                HCT                      38.9                08/14/2023                MCV                      88.6                08/14/2023                PLT                      253                 08/14/2023              Anesthesia Other Findings   Reproductive/Obstetrics                              Anesthesia Physical Anesthesia Plan  ASA: 3  Anesthesia Plan: General   Post-op Pain Management:    Induction: Intravenous  PONV Risk Score and Plan: 3 and Ondansetron, Dexamethasone and Treatment may vary due to age or medical condition  Airway Management Planned: Oral ETT  Additional Equipment:   Intra-op Plan:   Post-operative Plan: Extubation in OR  Informed Consent: I have reviewed the patients History and Physical, chart, labs and discussed the procedure including the risks, benefits and alternatives for the proposed anesthesia with the patient or authorized representative who has indicated his/her understanding and acceptance.     Dental advisory given  Plan Discussed with: CRNA and Anesthesiologist  Anesthesia Plan Comments: (Risks of general anesthesia discussed including, but not limited to, sore throat, hoarse voice, chipped/damaged teeth, injury to vocal cords, nausea and vomiting, allergic reactions, lung infection, heart attack, stroke, and death. All questions answered. )        Anesthesia Quick Evaluation

## 2024-02-04 ENCOUNTER — Other Ambulatory Visit: Payer: Self-pay

## 2024-02-04 ENCOUNTER — Ambulatory Visit (HOSPITAL_COMMUNITY): Payer: Self-pay | Admitting: Physician Assistant

## 2024-02-04 ENCOUNTER — Ambulatory Visit (HOSPITAL_BASED_OUTPATIENT_CLINIC_OR_DEPARTMENT_OTHER): Payer: Self-pay | Admitting: Physician Assistant

## 2024-02-04 ENCOUNTER — Ambulatory Visit (HOSPITAL_COMMUNITY)
Admission: RE | Admit: 2024-02-04 | Discharge: 2024-02-04 | Disposition: A | Discharge status: Home / Self Care | Attending: *Deleted | Admitting: *Deleted

## 2024-02-04 ENCOUNTER — Encounter (HOSPITAL_COMMUNITY)
Admission: RE | Disposition: A | Discharge status: Home / Self Care | Payer: Self-pay | Source: Home / Self Care | Attending: *Deleted

## 2024-02-04 ENCOUNTER — Encounter (HOSPITAL_COMMUNITY): Payer: Self-pay | Admitting: *Deleted

## 2024-02-04 ENCOUNTER — Ambulatory Visit (HOSPITAL_COMMUNITY)
Admission: RE | Admit: 2024-02-04 | Discharge: 2024-02-04 | Discharge status: Home / Self Care | Source: Ambulatory Visit | Attending: Adult Health | Admitting: *Deleted

## 2024-02-04 ENCOUNTER — Other Ambulatory Visit: Payer: Self-pay | Admitting: Internal Medicine

## 2024-02-04 ENCOUNTER — Other Ambulatory Visit: Payer: Self-pay | Admitting: Adult Health

## 2024-02-04 ENCOUNTER — Encounter: Payer: Self-pay | Admitting: Adult Health

## 2024-02-04 DIAGNOSIS — Z6841 Body Mass Index (BMI) 40.0 and over, adult: Secondary | ICD-10-CM | POA: Insufficient documentation

## 2024-02-04 DIAGNOSIS — E039 Hypothyroidism, unspecified: Secondary | ICD-10-CM | POA: Insufficient documentation

## 2024-02-04 DIAGNOSIS — E236 Other disorders of pituitary gland: Secondary | ICD-10-CM | POA: Insufficient documentation

## 2024-02-04 DIAGNOSIS — F32A Depression, unspecified: Secondary | ICD-10-CM | POA: Insufficient documentation

## 2024-02-04 DIAGNOSIS — Q0700 Arnold-Chiari syndrome without spina bifida or hydrocephalus: Secondary | ICD-10-CM

## 2024-02-04 DIAGNOSIS — M5416 Radiculopathy, lumbar region: Secondary | ICD-10-CM | POA: Insufficient documentation

## 2024-02-04 DIAGNOSIS — F419 Anxiety disorder, unspecified: Secondary | ICD-10-CM | POA: Insufficient documentation

## 2024-02-04 DIAGNOSIS — E119 Type 2 diabetes mellitus without complications: Secondary | ICD-10-CM | POA: Diagnosis not present

## 2024-02-04 DIAGNOSIS — G4733 Obstructive sleep apnea (adult) (pediatric): Secondary | ICD-10-CM

## 2024-02-04 DIAGNOSIS — I10 Essential (primary) hypertension: Secondary | ICD-10-CM | POA: Insufficient documentation

## 2024-02-04 DIAGNOSIS — F418 Other specified anxiety disorders: Secondary | ICD-10-CM | POA: Diagnosis not present

## 2024-02-04 DIAGNOSIS — R519 Headache, unspecified: Secondary | ICD-10-CM | POA: Diagnosis not present

## 2024-02-04 DIAGNOSIS — E6689 Other obesity not elsewhere classified: Secondary | ICD-10-CM | POA: Diagnosis not present

## 2024-02-04 DIAGNOSIS — D649 Anemia, unspecified: Secondary | ICD-10-CM | POA: Insufficient documentation

## 2024-02-04 DIAGNOSIS — M1990 Unspecified osteoarthritis, unspecified site: Secondary | ICD-10-CM | POA: Diagnosis not present

## 2024-02-04 HISTORY — PX: RADIOLOGY WITH ANESTHESIA: SHX6223

## 2024-02-04 LAB — BASIC METABOLIC PANEL WITH GFR
Anion gap: 8 (ref 5–15)
BUN: 11 mg/dL (ref 6–20)
CO2: 21 mmol/L — ABNORMAL LOW (ref 22–32)
Calcium: 8.6 mg/dL — ABNORMAL LOW (ref 8.9–10.3)
Chloride: 108 mmol/L (ref 98–111)
Creatinine, Ser: 0.87 mg/dL (ref 0.44–1.00)
GFR, Estimated: >60 mL/min (ref >60)
Glucose, Bld: 131 mg/dL — ABNORMAL HIGH (ref 70–99)
Potassium: 3.8 mmol/L (ref 3.5–5.1)
Sodium: 137 mmol/L (ref 135–145)

## 2024-02-04 LAB — CBC
HCT: 38.8 % (ref 36.0–46.0)
Hemoglobin: 12.3 g/dL (ref 12.0–15.0)
MCH: 28.3 pg (ref 26.0–34.0)
MCHC: 31.7 g/dL (ref 30.0–36.0)
MCV: 89.4 fL (ref 80.0–100.0)
Platelets: 266 10*3/uL (ref 150–400)
RBC: 4.34 MIL/uL (ref 3.87–5.11)
RDW: 16.5 % — ABNORMAL HIGH (ref 11.5–15.5)
WBC: 8 10*3/uL (ref 4.0–10.5)
nRBC: 0 % (ref 0.0–0.2)

## 2024-02-04 LAB — GLUCOSE, CAPILLARY
Glucose-Capillary: 139 mg/dL — ABNORMAL HIGH (ref 70–99)
Glucose-Capillary: 105 mg/dL — ABNORMAL HIGH (ref 70–99)

## 2024-02-04 SURGERY — MRI WITH ANESTHESIA
Anesthesia: General

## 2024-02-04 MED ORDER — ONDANSETRON HCL 4 MG/2ML IJ SOLN
INTRAMUSCULAR | Status: DC | PRN
  Administered 2024-02-04: 4 mg via INTRAVENOUS

## 2024-02-04 MED ORDER — ACETAMINOPHEN 10 MG/ML IV SOLN: 1000.0000 mg | Freq: Once | INTRAVENOUS | Status: DC | PRN

## 2024-02-04 MED ORDER — ROCURONIUM BROMIDE 10 MG/ML (PF) SYRINGE
PREFILLED_SYRINGE | INTRAVENOUS | Status: DC | PRN
  Administered 2024-02-04: 70 mg via INTRAVENOUS

## 2024-02-04 MED ORDER — MONTELUKAST SODIUM 10 MG PO TABS: 10.0000 mg | ORAL_TABLET | Freq: Every day | ORAL | 3 refills | Status: DC

## 2024-02-04 MED ORDER — GADOBUTROL 1 MMOL/ML IV SOLN
10.0000 mL | Freq: Once | INTRAVENOUS | Status: AC | PRN
  Administered 2024-02-04: 10 mL via INTRAVENOUS

## 2024-02-04 MED ORDER — SUMATRIPTAN SUCCINATE 100 MG PO TABS: ORAL_TABLET | ORAL | 11 refills | Status: DC

## 2024-02-04 MED ORDER — SUGAMMADEX SODIUM 200 MG/2ML IV SOLN
INTRAVENOUS | Status: DC | PRN
Start: 2024-02-04 — End: 2024-02-04
  Administered 2024-02-04: 400 mg via INTRAVENOUS

## 2024-02-04 MED ORDER — CHLORHEXIDINE GLUCONATE 0.12 % MT SOLN
15.0000 mL | Freq: Once | OROMUCOSAL | Status: AC
  Administered 2024-02-04: 15 mL via OROMUCOSAL
  Filled 2024-02-04: qty 15

## 2024-02-04 MED ORDER — PROPOFOL 10 MG/ML IV BOLUS
INTRAVENOUS | Status: DC | PRN
  Administered 2024-02-04: 200 mg via INTRAVENOUS

## 2024-02-04 MED ORDER — LACTATED RINGERS IV SOLN: INTRAVENOUS | Status: DC

## 2024-02-04 MED ORDER — QVAR REDIHALER 80 MCG/ACT IN AERB: 1.0000 | INHALATION_SPRAY | Freq: Two times a day (BID) | RESPIRATORY_TRACT | 2 refills | Status: DC

## 2024-02-04 MED ORDER — LIDOCAINE 2% (20 MG/ML) 5 ML SYRINGE
INTRAMUSCULAR | Status: DC | PRN
  Administered 2024-02-04: 100 mg via INTRAVENOUS

## 2024-02-04 MED ORDER — MIDAZOLAM HCL 2 MG/2ML IJ SOLN
INTRAMUSCULAR | Status: AC
Start: 2024-02-04 — End: 2024-02-04
  Filled 2024-02-04: qty 2

## 2024-02-04 MED ORDER — ORAL CARE MOUTH RINSE: 15.0000 mL | Freq: Once | OROMUCOSAL | Status: AC

## 2024-02-04 MED ORDER — MIDAZOLAM HCL 2 MG/2ML IJ SOLN
INTRAMUSCULAR | Status: DC | PRN
  Administered 2024-02-04: 2 mg via INTRAVENOUS

## 2024-02-04 NOTE — Transfer of Care (Signed)
 Immediate Anesthesia Transfer of Care Note  Patient: Vanessa [name]  Procedure(s) Performed: MRI WITH ANESTHESIA  Patient Location: PACU  Anesthesia Type:General  Level of Consciousness: awake, oriented, and drowsy  Airway & Oxygen Therapy: Patient Spontanous Breathing  Post-op Assessment: Report given to RN, Post -op Vital signs reviewed and stable, and Patient moving all extremities  Post vital signs: Reviewed and stable  Last Vitals:  Vitals Value Taken Time  BP 115/83 02/04/24 0945  Temp 98.0   Pulse 79 02/04/24 0946  Resp 20 02/04/24 0946  SpO2 98 % 02/04/24 0946  Vitals shown include unfiled device data.  Last Pain:  Vitals:   02/04/24 0650  PainSc: 7          Complications: No notable events documented.

## 2024-02-04 NOTE — Addendum Note (Signed)
 Addendum  created 02/04/24 1527 by Kayleen Memos, CRNA   Flowsheet accepted, Intraprocedure Flowsheets edited

## 2024-02-04 NOTE — Telephone Encounter (Signed)
 Copied from CRM [id number]. Topic: Clinical - Medication Refill >> Feb 04, 2024  1:44 PM Aisha D wrote: Most Recent Primary Care Visit:  Provider: Corwin Levins  Department: [facility]  Visit Type: ACUTE  Date: 12/11/2023  Medication: montelukast (SINGULAIR) 10 MG tablet, beclomethasone (QVAR REDIHALER) 80 MCG/ACT inhaler, SUMAtriptan (IMITREX) 100 MG tablet  Has the patient contacted their pharmacy? Yes (Agent: If no, request that the patient contact the pharmacy for the refill. If patient does not wish to contact the pharmacy document the reason why and proceed with request.) (Agent: If yes, when and what did the pharmacy advise?) advised to call the PCP  Is this the correct pharmacy for this prescription? Yes If no, delete pharmacy and type the correct one.  This is the patient's preferred pharmacy:  [facility] [phone] - [place], [place] - [address] AT [facility] [address]  [place] [address] Phone: [phone] Fax: [phone]   Has the prescription been filled recently? No  Is the patient out of the medication? Yes  Has the patient been seen for an appointment in the last year OR does the patient have an upcoming appointment? Yes  Can we respond through MyChart? No  Agent: Please be advised that Rx refills may take up to 3 business days. We ask that you follow-up with your pharmacy.

## 2024-02-04 NOTE — Anesthesia Postprocedure Evaluation (Signed)
 Anesthesia Post Note  Patient: Vanessa [name]  Procedure(s) Performed: MRI WITH ANESTHESIA     Patient location during evaluation: PACU Anesthesia Type: General Level of consciousness: awake Pain management: pain level controlled Vital Signs Assessment: post-procedure vital signs reviewed and stable Respiratory status: spontaneous breathing, nonlabored ventilation and respiratory function stable Cardiovascular status: blood pressure returned to baseline and stable Postop Assessment: no apparent nausea or vomiting Anesthetic complications: no   No notable events documented.  Last Vitals:  Vitals:   02/04/24 0945 02/04/24 1000  BP: 115/83 119/75  Pulse: 75 71  Resp: 15 (!) 21  Temp:  36.6 C  SpO2: 99% 100%    Last Pain:  Vitals:   02/04/24 1000  PainSc: 0-No pain                 Linton Rump

## 2024-02-04 NOTE — Anesthesia Procedure Notes (Signed)
 Procedure Name: Intubation Date/Time: 02/04/2024 8:05 AM  Performed by: Kayleen Memos, CRNAPre-anesthesia Checklist: Patient identified, Emergency Drugs available, Suction available and Patient being monitored Patient Re-evaluated:Patient Re-evaluated prior to induction Oxygen Delivery Method: Circle System Utilized Preoxygenation: Pre-oxygenation with 100% oxygen Induction Type: IV induction Ventilation: Mask ventilation with difficulty, Two handed mask ventilation required and Oral airway inserted - appropriate to patient size Laryngoscope Size: Mac and 4 Grade View: Grade I Tube type: Oral Tube size: 7.0 mm Number of attempts: 1 Airway Equipment and Method: Stylet and Oral airway Placement Confirmation: ETT inserted through vocal cords under direct vision, positive ETCO2 and breath sounds checked- equal and bilateral Secured at: 23 cm Tube secured with: Tape Dental Injury: Teeth and Oropharynx as per pre-operative assessment  Comments: Unsuccessful attempt by Jerl Santos, SRNA due to positioning difficulty. Second attempt by CRNA successful with G1V Mac4 blade

## 2024-02-05 ENCOUNTER — Encounter (HOSPITAL_COMMUNITY): Payer: Self-pay | Admitting: Radiology

## 2024-02-08 ENCOUNTER — Telehealth: Payer: Self-pay | Admitting: *Deleted

## 2024-02-08 NOTE — Telephone Encounter (Signed)
 Zott, Hennie Duos, RN; Elsie [place], [place] Got It Thank You     Previous Messages    ----- Message ----- From: Guy Begin, RN Sent: 02/08/2024  10:40 AM EDT To: Melvern Sample; Stacy Zott Subject: restart her autopap therapy                    Good morning,  Elaya C. Rockers Female, 53 y.o., [birth year] Pronouns: she/her/hers MRN: [id number]   Thanks,  Barbaraann Cao, Hennie Duos, RN; Melvern Sample The patient had returned her machine as she already had one restart and did not meet compliance, I see where she had a new sleep test so we can set her back up once we get the order approved     Previous Messages    ----- Message ----- From: Guy Begin, RN Sent: 02/08/2024   1:31 PM EDT To: Melvern Sample; Stacy Zott Subject: restart therapy she has her machine            I believe th pt already has her machine,  Restart her AutoPap therapy with a pressure setting of 7 to 14 cm with EPR of 2 or as per patient preference, mask of choice, sized to fit.  Veleria C. Poffenberger Female, 53 y.o., 12-24-1970 Pronouns: she/her/hers MRN: [id number]   [place]

## 2024-02-08 NOTE — Telephone Encounter (Signed)
 I called reading room and they will check on this and let us know.

## 2024-02-08 NOTE — Telephone Encounter (Signed)
 Pt is checking on her MRI results exam ended 02-04-2024

## 2024-02-09 ENCOUNTER — Encounter: Payer: Self-pay | Admitting: *Deleted

## 2024-02-09 NOTE — Telephone Encounter (Signed)
 I relayed to pt the results of her most recent sleep test.  Moderate OSA.  She will contact DME about restarting as she has machine.  Message sent to [facility].

## 2024-02-09 NOTE — Telephone Encounter (Signed)
 Called reading room, and spoke to stacy, about reading the MRI brain 02-04-2024, she will get this taken care for [place].

## 2024-02-09 NOTE — Telephone Encounter (Signed)
 Pt does not use mychart.  I called her and let her know waiting on MRI to be read and also [facility] is checking with insurance for approval since had sleep test. If she does not get call within week let us know.  She appreciated follow up.

## 2024-02-13 ENCOUNTER — Encounter: Payer: Self-pay | Admitting: Adult Health

## 2024-02-15 NOTE — Telephone Encounter (Signed)
 Pt called and LVM stating that she is not able to get in to her mychart and read any messages or results. Pt would like nurse to call and explain the results to her. Please advise.

## 2024-02-15 NOTE — Telephone Encounter (Signed)
 Spoke with patient and advised her that MRI doesn't show any changes. Results were sent to Dr Gracie Lav. If there is any further comment we will call her back. Pt was very appreciative and also mentioned that she is waiting to hear back from [facility] as to whether or not Medicaid is going to cover a specific piece of equipment  that is needed for her to restart cpap. I sent a secure message to [facility] just to ensure they are still waiting on medicaid or is there any other update for patient.

## 2024-02-15 NOTE — Telephone Encounter (Signed)
 Zott, [place] Grim, [place], RN; [place], Tammy [place], we called her on 4/10 and Left her a message to let her know we had her order approved and to call us  back to make an appointment to get her set back up with the equipment.  I called the pt and let her know. She will call [facility].

## 2024-02-24 DIAGNOSIS — Z1151 Encounter for screening for human papillomavirus (HPV): Secondary | ICD-10-CM | POA: Diagnosis not present

## 2024-02-24 DIAGNOSIS — Z0001 Encounter for general adult medical examination with abnormal findings: Secondary | ICD-10-CM | POA: Diagnosis not present

## 2024-02-24 DIAGNOSIS — N840 Polyp of corpus uteri: Secondary | ICD-10-CM | POA: Diagnosis not present

## 2024-02-24 DIAGNOSIS — N841 Polyp of cervix uteri: Secondary | ICD-10-CM | POA: Diagnosis not present

## 2024-02-25 DIAGNOSIS — G4733 Obstructive sleep apnea (adult) (pediatric): Secondary | ICD-10-CM | POA: Diagnosis not present

## 2024-03-03 DIAGNOSIS — M79662 Pain in left lower leg: Secondary | ICD-10-CM | POA: Diagnosis not present

## 2024-03-03 DIAGNOSIS — G4733 Obstructive sleep apnea (adult) (pediatric): Secondary | ICD-10-CM | POA: Diagnosis not present

## 2024-03-03 DIAGNOSIS — I83893 Varicose veins of bilateral lower extremities with other complications: Secondary | ICD-10-CM | POA: Diagnosis not present

## 2024-03-03 DIAGNOSIS — Q820 Hereditary lymphedema: Secondary | ICD-10-CM | POA: Diagnosis not present

## 2024-03-03 DIAGNOSIS — M79661 Pain in right lower leg: Secondary | ICD-10-CM | POA: Diagnosis not present

## 2024-03-14 ENCOUNTER — Telehealth: Payer: Self-pay

## 2024-03-14 NOTE — Telephone Encounter (Signed)
 Copied from CRM [id number]. Topic: Clinical - Lab/Test Results >> Mar 14, 2024  2:36 PM Adonis Hoot wrote: Reason for CRM: Patient called in to ask has Dr Autry Legions received biopsy results from [facility]? She also would like to know if Varicose vein restoration has sent over results to Dr Autry Legions as well she had US  done on her legs.

## 2024-03-15 ENCOUNTER — Ambulatory Visit: Payer: Medicaid Other | Admitting: Internal Medicine

## 2024-03-18 DIAGNOSIS — Z1231 Encounter for screening mammogram for malignant neoplasm of breast: Secondary | ICD-10-CM | POA: Diagnosis not present

## 2024-03-18 LAB — HM MAMMOGRAPHY

## 2024-03-23 NOTE — Telephone Encounter (Signed)
 Oh yes - the GYN information showed no malignancy (cancer) and neg for HPV virus  Also the leg ultrasound did not show blood clot.   No changes in tx    thanks

## 2024-03-23 NOTE — Telephone Encounter (Signed)
 Called and let Pt know

## 2024-04-03 DIAGNOSIS — G4733 Obstructive sleep apnea (adult) (pediatric): Secondary | ICD-10-CM | POA: Diagnosis not present

## 2024-04-26 ENCOUNTER — Encounter: Payer: Self-pay | Admitting: Internal Medicine

## 2024-04-26 ENCOUNTER — Ambulatory Visit (INDEPENDENT_AMBULATORY_CARE_PROVIDER_SITE_OTHER): Admitting: Internal Medicine

## 2024-04-26 VITALS — BP 130/76 | HR 55 | Temp 98.2°F | Ht 62.0 in | Wt 361.0 lb

## 2024-04-26 DIAGNOSIS — J019 Acute sinusitis, unspecified: Secondary | ICD-10-CM

## 2024-04-26 DIAGNOSIS — E559 Vitamin D deficiency, unspecified: Secondary | ICD-10-CM

## 2024-04-26 DIAGNOSIS — E1165 Type 2 diabetes mellitus with hyperglycemia: Secondary | ICD-10-CM | POA: Diagnosis not present

## 2024-04-26 DIAGNOSIS — H9202 Otalgia, left ear: Secondary | ICD-10-CM | POA: Diagnosis not present

## 2024-04-26 MED ORDER — AMOXICILLIN-POT CLAVULANATE 875-125 MG PO TABS
1.0000 | ORAL_TABLET | Freq: Two times a day (BID) | ORAL | 0 refills | Status: DC
Start: 2024-04-26 — End: 2024-06-07

## 2024-04-26 NOTE — Patient Instructions (Signed)
 Please take all new medication as prescribed- the antibiotic  Please continue all other medications as before, and refills have been done if requested.  Please have the pharmacy call with any other refills you may need.  Please keep your appointments with your specialists as you may have planned  You will be contacted regarding the referral for: ENT  Please make an Appointment to return in 6 months, or sooner if needed
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
## 2024-04-27 ENCOUNTER — Encounter: Payer: Self-pay | Admitting: Internal Medicine

## 2024-04-27 DIAGNOSIS — J019 Acute sinusitis, unspecified: Secondary | ICD-10-CM | POA: Insufficient documentation

## 2024-04-27 NOTE — Assessment & Plan Note (Signed)
Last vitamin D Lab Results  Component Value Date   VD25OH 9.49 (L) 02/05/2023   Low, to start oral replacement

## 2024-04-27 NOTE — Assessment & Plan Note (Signed)
 Lab Results  Component Value Date   HGBA1C 6.3 (A) 11/17/2023   Stable, pt to continue current medical treatment  - diet, wt control

## 2024-04-27 NOTE — Assessment & Plan Note (Signed)
 Acute on chronic, for ENT referral

## 2024-04-27 NOTE — Assessment & Plan Note (Signed)
 Mild to mod, for antibx cours augmentin  bid,  to f/u any worsening symptoms or concerns

## 2024-05-03 ENCOUNTER — Ambulatory Visit: Admitting: Sports Medicine

## 2024-05-03 DIAGNOSIS — G4733 Obstructive sleep apnea (adult) (pediatric): Secondary | ICD-10-CM | POA: Diagnosis not present

## 2024-05-04 ENCOUNTER — Telehealth: Payer: Self-pay

## 2024-05-04 NOTE — Telephone Encounter (Signed)
 Pt called in asking about appointment. Let pt know that her appointment is 05/13/2024 @10 :00am with Megan Millikan, NP.

## 2024-05-09 ENCOUNTER — Ambulatory Visit (INDEPENDENT_AMBULATORY_CARE_PROVIDER_SITE_OTHER): Admitting: Sports Medicine

## 2024-05-09 ENCOUNTER — Encounter: Payer: Self-pay | Admitting: Sports Medicine

## 2024-05-09 ENCOUNTER — Other Ambulatory Visit (INDEPENDENT_AMBULATORY_CARE_PROVIDER_SITE_OTHER): Payer: Self-pay

## 2024-05-09 DIAGNOSIS — M4316 Spondylolisthesis, lumbar region: Secondary | ICD-10-CM

## 2024-05-09 DIAGNOSIS — M25562 Pain in left knee: Secondary | ICD-10-CM

## 2024-05-09 DIAGNOSIS — M25561 Pain in right knee: Secondary | ICD-10-CM | POA: Diagnosis not present

## 2024-05-09 DIAGNOSIS — G8929 Other chronic pain: Secondary | ICD-10-CM

## 2024-05-09 DIAGNOSIS — I890 Lymphedema, not elsewhere classified: Secondary | ICD-10-CM

## 2024-05-09 DIAGNOSIS — M545 Low back pain, unspecified: Secondary | ICD-10-CM | POA: Diagnosis not present

## 2024-05-09 DIAGNOSIS — M1712 Unilateral primary osteoarthritis, left knee: Secondary | ICD-10-CM | POA: Diagnosis not present

## 2024-05-09 NOTE — Progress Notes (Signed)
 Patient says that she has left knee pain and low back pain that has never gone away, but has gotten worse over the last few weeks. She denies any new injuries to either location of pain. She says that she is using pain patches, and takes Tylenol  only as needed. She has recently been diagnosed with lymphedema. She says that she initially had an appointment for that due to pain in the lower legs, which also keeps her up at night.
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
## 2024-05-13 ENCOUNTER — Encounter: Payer: Self-pay | Admitting: Adult Health

## 2024-05-13 ENCOUNTER — Ambulatory Visit (INDEPENDENT_AMBULATORY_CARE_PROVIDER_SITE_OTHER): Admitting: Adult Health

## 2024-05-13 VITALS — BP 138/84 | Ht 62.0 in | Wt 359.0 lb

## 2024-05-13 DIAGNOSIS — E236 Other disorders of pituitary gland: Secondary | ICD-10-CM | POA: Diagnosis not present

## 2024-05-13 DIAGNOSIS — G4733 Obstructive sleep apnea (adult) (pediatric): Secondary | ICD-10-CM

## 2024-05-13 DIAGNOSIS — Q0700 Arnold-Chiari syndrome without spina bifida or hydrocephalus: Secondary | ICD-10-CM | POA: Diagnosis not present
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
## 2024-05-13 NOTE — Patient Instructions (Signed)
 Continue using CPAP nightly and greater than 4 hours each night If your symptoms worsen or you develop new symptoms please let us know.

## 2024-05-26 DIAGNOSIS — N939 Abnormal uterine and vaginal bleeding, unspecified: Secondary | ICD-10-CM | POA: Diagnosis not present

## 2024-05-26 DIAGNOSIS — N840 Polyp of corpus uteri: Secondary | ICD-10-CM | POA: Diagnosis not present

## 2024-06-01 ENCOUNTER — Telehealth: Payer: Self-pay

## 2024-06-01 NOTE — Telephone Encounter (Signed)
 Copied from CRM [id number]. Topic: Clinical - Medication Question >> Jun 01, 2024  2:07 PM [place] A wrote: Reason for CRM: Patient would like to know if Dr.John could write a prescription for Vitamin D  and send to [facility] if he approves it for her-please contact patient

## 2024-06-03 NOTE — Telephone Encounter (Signed)
 No need for high dose rx of Vit D since they have not been shown to be effective reducing risk of fractures with osteoporosis.  Please take OTC Vitamin D3 at 2000 units per day, indefinitely, or 4000 units if you already take the 2000 units.

## 2024-06-06 DIAGNOSIS — E119 Type 2 diabetes mellitus without complications: Secondary | ICD-10-CM | POA: Diagnosis not present

## 2024-06-06 DIAGNOSIS — H35033 Hypertensive retinopathy, bilateral: Secondary | ICD-10-CM | POA: Diagnosis not present

## 2024-06-06 DIAGNOSIS — H04123 Dry eye syndrome of bilateral lacrimal glands: Secondary | ICD-10-CM | POA: Diagnosis not present

## 2024-06-06 DIAGNOSIS — H0288B Meibomian gland dysfunction left eye, upper and lower eyelids: Secondary | ICD-10-CM | POA: Diagnosis not present

## 2024-06-06 DIAGNOSIS — H0288A Meibomian gland dysfunction right eye, upper and lower eyelids: Secondary | ICD-10-CM | POA: Diagnosis not present

## 2024-06-06 LAB — HM DIABETES EYE EXAM

## 2024-06-07 ENCOUNTER — Encounter: Payer: Self-pay | Admitting: Internal Medicine

## 2024-06-07 ENCOUNTER — Ambulatory Visit (INDEPENDENT_AMBULATORY_CARE_PROVIDER_SITE_OTHER): Admitting: Internal Medicine

## 2024-06-07 VITALS — BP 122/70 | HR 68

## 2024-06-07 DIAGNOSIS — E1169 Type 2 diabetes mellitus with other specified complication: Secondary | ICD-10-CM | POA: Diagnosis not present

## 2024-06-07 DIAGNOSIS — E785 Hyperlipidemia, unspecified: Secondary | ICD-10-CM

## 2024-06-07 DIAGNOSIS — E039 Hypothyroidism, unspecified: Secondary | ICD-10-CM | POA: Diagnosis not present

## 2024-06-07 DIAGNOSIS — E1165 Type 2 diabetes mellitus with hyperglycemia: Secondary | ICD-10-CM | POA: Diagnosis not present

## 2024-06-07 LAB — POCT GLYCOSYLATED HEMOGLOBIN (HGB A1C): Hemoglobin A1C: 6.5 % — AB (ref 4.0–5.6)
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
## 2024-06-07 NOTE — Patient Instructions (Signed)
 Please continue off the insulin .  Please continue Levothyroxine  175 mcg daily.  Take the thyroid  hormone every day, with water , at least 30 minutes before breakfast, separated by at least 4 hours from: - acid reflux medications - calcium - iron - multivitamins  Please return in 4 months.

## 2024-06-08 ENCOUNTER — Ambulatory Visit: Payer: Self-pay | Admitting: Internal Medicine

## 2024-06-08 LAB — MICROALBUMIN / CREATININE URINE RATIO
Creatinine, Urine: 98 mg/dL (ref 20–275)
Microalb, Ur: <0.2 mg/dL

## 2024-06-13 DIAGNOSIS — N8500 Endometrial hyperplasia, unspecified: Secondary | ICD-10-CM | POA: Diagnosis not present

## 2024-06-13 DIAGNOSIS — N8502 Endometrial intraepithelial neoplasia [EIN]: Secondary | ICD-10-CM | POA: Insufficient documentation

## 2024-07-01 ENCOUNTER — Telehealth: Payer: Self-pay | Admitting: Sports Medicine

## 2024-07-01 ENCOUNTER — Other Ambulatory Visit: Payer: Self-pay | Admitting: Sports Medicine

## 2024-07-01 NOTE — Telephone Encounter (Signed)
 Patient called. She would like something called in for pain. Something stronger. Her [phone]

## 2024-07-07 NOTE — Telephone Encounter (Signed)
 Copied from CRM [id number]. Topic: Clinical - Medication Question >> Jul 07, 2024  1:03 PM [place] J wrote: Reason for CRM: Patient explained to me that she spoke with Dr Norleen in regards to starting on weight loss medication. She said that it was mounjaro  that they had agreed on starting her with and she was wondering if Dr Norleen could send the prescription to:   [facility] [phone] - [place], [place] - [address] AT [facility] [address] [place] [address] Phone: [phone] Fax: [phone] Hours: Not open 24 hours  Patient prefers a phone call once completed or when there is an update on status.

## 2024-07-08 MED ORDER — TIRZEPATIDE 2.5 MG/0.5ML ~~LOC~~ SOAJ: 2.5000 mg | SUBCUTANEOUS | 11 refills | Status: DC

## 2024-07-08 NOTE — Telephone Encounter (Signed)
 Ok this is done, and call in 1 month for higher dose if doing ok without nausea or constipatoin

## 2024-07-11 NOTE — Telephone Encounter (Signed)
 Called and spoke with patient. Informed her to reach out to office after taking medication for a month to inform us  of tolerance. Patient expressed understanding and did note that she wouldn't be taking this medication until after her hysterectomy

## 2024-07-18 ENCOUNTER — Ambulatory Visit (INDEPENDENT_AMBULATORY_CARE_PROVIDER_SITE_OTHER): Admitting: Audiology

## 2024-07-18 ENCOUNTER — Institutional Professional Consult (permissible substitution) (INDEPENDENT_AMBULATORY_CARE_PROVIDER_SITE_OTHER): Admitting: Otolaryngology

## 2024-07-26 ENCOUNTER — Telehealth: Payer: Self-pay

## 2024-07-26 NOTE — Telephone Encounter (Signed)
 J, She may need to have evaluation by PCP for this.  Also, I am not sure if she qualifies for hormone replacement therapy, this is per OB/GYN. Ty! C

## 2024-07-26 NOTE — Telephone Encounter (Signed)
 Patient called stating she has been having low energy since she had her hysterectomy on 07/21/24. She wanted to know is there anything you can prescribe or recommend for her.

## 2024-07-27 NOTE — Telephone Encounter (Signed)
 I called and spoke with the patient and she has been notified.

## 2024-08-18 ENCOUNTER — Ambulatory Visit: Admitting: Adult Health

## 2024-09-05 ENCOUNTER — Encounter: Payer: Self-pay | Admitting: Radiology

## 2024-09-09 ENCOUNTER — Ambulatory Visit (INDEPENDENT_AMBULATORY_CARE_PROVIDER_SITE_OTHER): Admitting: Otolaryngology

## 2024-09-09 ENCOUNTER — Ambulatory Visit (INDEPENDENT_AMBULATORY_CARE_PROVIDER_SITE_OTHER): Admitting: Audiology

## 2024-09-09 ENCOUNTER — Encounter (INDEPENDENT_AMBULATORY_CARE_PROVIDER_SITE_OTHER): Payer: Self-pay | Admitting: Otolaryngology

## 2024-09-09 VITALS — HR 74 | Temp 98.2°F | Ht 61.0 in | Wt 360.0 lb

## 2024-09-09 DIAGNOSIS — J310 Chronic rhinitis: Secondary | ICD-10-CM | POA: Diagnosis not present

## 2024-09-09 DIAGNOSIS — J343 Hypertrophy of nasal turbinates: Secondary | ICD-10-CM | POA: Diagnosis not present

## 2024-09-09 DIAGNOSIS — Z0110 Encounter for examination of ears and hearing without abnormal findings: Secondary | ICD-10-CM

## 2024-09-09 DIAGNOSIS — H918X3 Other specified hearing loss, bilateral: Secondary | ICD-10-CM

## 2024-09-09 DIAGNOSIS — J342 Deviated nasal septum: Secondary | ICD-10-CM | POA: Diagnosis not present

## 2024-09-09 DIAGNOSIS — H903 Sensorineural hearing loss, bilateral: Secondary | ICD-10-CM | POA: Insufficient documentation

## 2024-09-09 DIAGNOSIS — H608X3 Other otitis externa, bilateral: Secondary | ICD-10-CM

## 2024-09-09 MED ORDER — MOMETASONE FUROATE 0.1 % EX CREA: TOPICAL_CREAM | CUTANEOUS | 3 refills | Status: AC

## 2024-09-09 NOTE — Addendum Note (Signed)
 Addended byBETHA MOCCASIN, Sulo Janczak on: 09/09/2024 11:30 AM   Modules accepted: Orders

## 2024-09-09 NOTE — Progress Notes (Signed)
  [address] [place], [place] [address] [phone]  Audiological Evaluation    Name: Vanessa [name]     DOB:   [birth year]      MRN:   [id number]                                                                                     Service Date: 09/09/2024     Accompanied by: unaccomapnied   Patient comes today after Dr. Karis, ENT sent a referral for a hearing evaluation due to concerns with ear pain.   Symptoms Yes Details  Hearing loss  []    Tinnitus  [x]  Sometimes, both ears  Ear pain/ infections/pressure  [x]  Left ear pain, nagging, and itchy, for years  Balance problems  []    Noise exposure history  []    Previous ear surgeries  []    Family history of hearing loss  []    Amplification  []    Other  []      Otoscopy: Right ear: Clear external ear canal and notable landmarks visualized on the tympanic membrane. Left ear:  Clear external ear canal and notable landmarks visualized on the tympanic membrane.  Tympanometry: Right ear: Normal external ear canal volume with normal middle ear pressure and tympanic membrane compliance (Type A). Findings are suggestive of normal middle ear function. Left ear: Normal external ear canal volume with normal middle ear pressure and tympanic membrane compliance (Type A). Findings are suggestive of normal middle ear function.  Hearing Evaluation The hearing test results were completed under headphones and results are deemed to be of good reliability. Test technique:  conventional    Pure tone Audiometry: Both ears; Normal hearing from [phone].   Speech Audiometry: Right ear- Speech Reception Threshold (SRT) was obtained at 15 dBHL. Left ear-Speech Reception Threshold (SRT) was obtained at 15 dBHL.   Word Recognition Score Tested using NU-6 (recorded) Right ear: 100% was obtained at a presentation level of 50 dBHL with contralateral masking which is deemed as  excellent. Left ear: 100% was obtained at a presentation  level of 50 dBHL with contralateral masking which is deemed as  excellent.   Impression: There is not a significant difference in pure-tone thresholds between ears. There is not a significant difference in the word recognition score in between ears.    Recommendations: Follow up with ENT as scheduled for today. Return for a hearing evaluation if concerns with hearing changes arise or per MD recommendation.   [name] MARIE LEROUX-MARTINEZ, AUD
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
## 2024-09-12 ENCOUNTER — Ambulatory Visit: Admitting: Sports Medicine

## 2024-09-12 ENCOUNTER — Encounter: Payer: Self-pay | Admitting: Sports Medicine

## 2024-09-12 DIAGNOSIS — E11610 Type 2 diabetes mellitus with diabetic neuropathic arthropathy: Secondary | ICD-10-CM | POA: Diagnosis not present

## 2024-09-12 DIAGNOSIS — M5442 Lumbago with sciatica, left side: Secondary | ICD-10-CM

## 2024-09-12 DIAGNOSIS — M5441 Lumbago with sciatica, right side: Secondary | ICD-10-CM

## 2024-09-12 DIAGNOSIS — M4316 Spondylolisthesis, lumbar region: Secondary | ICD-10-CM

## 2024-09-12 DIAGNOSIS — M1712 Unilateral primary osteoarthritis, left knee: Secondary | ICD-10-CM

## 2024-09-12 DIAGNOSIS — G8929 Other chronic pain: Secondary | ICD-10-CM

## 2024-09-12 MED ORDER — METHYLPREDNISOLONE 4 MG PO TBPK: ORAL_TABLET | ORAL | 0 refills | Status: DC

## 2024-09-12 NOTE — Addendum Note (Signed)
 Addended by: MALVINA MELNICK E on: 09/12/2024 10:41 AM   Modules accepted: Orders

## 2024-09-12 NOTE — Progress Notes (Signed)
 Patient says that she has had the same back pain that she had at her visit in July, but her pain now feels deeper. She is having pain down the entire posterior right leg, although she denies any numbness or tingling. She says that she had a hysterectomy in September, and being stationary has made her back pain worse. She does not get relief with Tylenol  and says that the Meloxicam  was not very helpful either. She will have a change in insurance in the new year, and says that her plan is to begin going to the gym when it switches over.
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
## 2024-09-14 ENCOUNTER — Encounter: Payer: Self-pay | Admitting: Audiology

## 2024-09-23 ENCOUNTER — Telehealth: Payer: Self-pay | Admitting: Adult Health

## 2024-09-23 NOTE — Telephone Encounter (Signed)
 Patient said, Was on Medicaid and Medicare, but Medicaid may not pay all of it. [facility] advise need to have welcome to Medicare visit setup to make things easier. Patient requested a earlier appointment.,rescheduled for a 30 minute visit for patient. Informed patient to bring CPAP machine and power cord

## 2024-09-23 NOTE — Telephone Encounter (Signed)
 Last saw MM,NP 05/13/24. Next f/u scheduled for: 12/22/24 (previously 05/11/25).   Called [facility] at [phone] to get more info. Spoke w/ Pair. As of today, states pt switched to [facility] 07/04/24. Has to restart process to meet compliance.  She transferred me to [place]. Was in middle of rental process with them. In order to bill medicare, needs welcome to medicare visit/updated visit with our office. She then restarts 31-90 days compliance timeframe and then medicare will rent out for 13 months.   Aware we will route to Megan to see if she can fit in for sooner appt somewhere.

## 2024-09-27 NOTE — Telephone Encounter (Signed)
 Phone rep called pt, message from CMA was relayed, and pt is on wait list. Pt asked it be noted she has an upcoming f/u with her PCP on 12/18

## 2024-10-04 ENCOUNTER — Ambulatory Visit
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
## 2024-10-10 ENCOUNTER — Ambulatory Visit: Admitting: Internal Medicine
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
## 2024-10-25 ENCOUNTER — Ambulatory Visit (INDEPENDENT_AMBULATORY_CARE_PROVIDER_SITE_OTHER): Admitting: Internal Medicine

## 2024-10-25 ENCOUNTER — Encounter: Payer: Self-pay | Admitting: Internal Medicine

## 2024-10-25 VITALS — BP 130/78 | HR 70 | Temp 98.5°F | Ht 61.0 in | Wt 374.0 lb

## 2024-10-25 DIAGNOSIS — E559 Vitamin D deficiency, unspecified: Secondary | ICD-10-CM

## 2024-10-25 DIAGNOSIS — E1169 Type 2 diabetes mellitus with other specified complication: Secondary | ICD-10-CM

## 2024-10-25 DIAGNOSIS — I890 Lymphedema, not elsewhere classified: Secondary | ICD-10-CM

## 2024-10-25 DIAGNOSIS — Z1211 Encounter for screening for malignant neoplasm of colon: Secondary | ICD-10-CM

## 2024-10-25 DIAGNOSIS — I10 Essential (primary) hypertension: Secondary | ICD-10-CM

## 2024-10-25 DIAGNOSIS — E1165 Type 2 diabetes mellitus with hyperglycemia: Secondary | ICD-10-CM

## 2024-10-25 DIAGNOSIS — E785 Hyperlipidemia, unspecified: Secondary | ICD-10-CM | POA: Diagnosis not present

## 2024-10-25 MED ORDER — MOMETASONE FURO-FORMOTEROL FUM 200-5 MCG/ACT IN AERO: 2.0000 | INHALATION_SPRAY | Freq: Two times a day (BID) | RESPIRATORY_TRACT | 11 refills | Status: AC

## 2024-10-25 MED ORDER — SUMATRIPTAN SUCCINATE 100 MG PO TABS: ORAL_TABLET | ORAL | 11 refills | Status: AC

## 2024-10-25 MED ORDER — QVAR REDIHALER 80 MCG/ACT IN AERB: 1.0000 | INHALATION_SPRAY | Freq: Two times a day (BID) | RESPIRATORY_TRACT | 2 refills | Status: AC

## 2024-10-25 MED ORDER — MONTELUKAST SODIUM 10 MG PO TABS: 10.0000 mg | ORAL_TABLET | Freq: Every day | ORAL | 3 refills | Status: AC

## 2024-10-25 MED ORDER — ALBUTEROL SULFATE HFA 108 (90 BASE) MCG/ACT IN AERS: 2.0000 | INHALATION_SPRAY | Freq: Four times a day (QID) | RESPIRATORY_TRACT | 0 refills | Status: AC | PRN

## 2024-10-25 MED ORDER — LOSARTAN POTASSIUM 50 MG PO TABS: 50.0000 mg | ORAL_TABLET | Freq: Every day | ORAL | 3 refills | Status: AC

## 2024-10-25 MED ORDER — CITALOPRAM HYDROBROMIDE 20 MG PO TABS: 20.0000 mg | ORAL_TABLET | Freq: Every day | ORAL | 3 refills | Status: AC

## 2024-10-25 MED ORDER — HYDROCHLOROTHIAZIDE 25 MG PO TABS: 25.0000 mg | ORAL_TABLET | Freq: Every day | ORAL | 3 refills | Status: AC

## 2024-10-25 NOTE — Assessment & Plan Note (Signed)
Last vitamin D Lab Results  Component Value Date   VD25OH 9.49 (L) 02/05/2023   Low, to start oral replacement

## 2024-10-25 NOTE — Assessment & Plan Note (Addendum)
 With hyperglycemia, without insulin   Lab Results  Component Value Date   HGBA1C 6.5 (A) 06/07/2024   Stable, pt to continue current medical treatment  - diet, wt control, declines GLP1 start

## 2024-10-25 NOTE — Assessment & Plan Note (Signed)
 Pt to continue bilateral compression hose and wear daily

## 2024-10-25 NOTE — Assessment & Plan Note (Signed)
 BP Readings from Last 3 Encounters:  10/25/24 130/78  06/07/24 122/70  05/13/24 138/84   Stable, pt to continue medical treatment hct 25 every day, losartan  50 every day

## 2024-10-25 NOTE — Patient Instructions (Signed)
 Please continue all other medications as before, and refills have been done if requested.  Please have the pharmacy call with any other refills you may need.  Please continue your efforts at being more active, low cholesterol diet, and weight control.  Please keep your appointments with your specialists as you may have planned - endo for sugar and thyroid   You will be contacted regarding the referral for: Cologuard  Please go to the LAB at the blood drawing area for the tests to be done - the cholesterol  You will be contacted by phone if any changes need to be made immediately.  Otherwise, you will receive a letter about your results with an explanation, but please check with MyChart first.  Please make an Appointment to return in 6 months, or sooner if needed
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
## 2024-11-08 ENCOUNTER — Ambulatory Visit: Payer: Self-pay

## 2024-11-08 ENCOUNTER — Telehealth: Payer: Self-pay | Admitting: Adult Health

## 2024-11-08 NOTE — Telephone Encounter (Signed)
 Spoke to patient states had  episode  last night . Pt states first time having these symptoms. Made pt aware needs to go to ED or urgent care ASAP to get evaluated. Pt states doesn't have transportation today . Made pt aware can always call 911 to get transported to ED Pt states will call PCP today to make them aware of episode . Pt states can get an appointment tomorrow. Made pt aware if symptoms worsen please call 911 to get transported to ED for evaluation. Pt expressed understanding and thanked me for calling

## 2024-11-08 NOTE — Telephone Encounter (Signed)
 Called [facility] and advised Vanessa [name] of patient's current symptoms and ED disposition & patient stating that she will call her daughter to take her to the ER.

## 2024-11-08 NOTE — Telephone Encounter (Signed)
 Agree with plan. She also reached out to PCP and was also advised to go to ED. Per there documentation she was having her daughter take her.

## 2024-11-08 NOTE — Telephone Encounter (Signed)
 Pt states between 12-2:30 this morning she experienced: an episode that took her out of her sleep, came on like a migraine, L side. Pt was dizzy, blurry vision, no change in facial movement.  Pt took tylenol , put CPAP mask on which helped some, still has some soreness in head, pain in L eye, pt has never had a stroke, does have allergies, pt has never experiences anything like this before, please call.

## 2024-11-08 NOTE — Telephone Encounter (Signed)
 FYI Only or Action Required?: Action required by provider: update on patient condition.  Patient was last seen in primary care on 10/25/2024 by Norleen Lynwood ORN, MD.  Called Nurse Triage reporting Headache.  Symptoms began today at 12:30an.  Interventions attempted: OTC medications: Tylenol , Rest, hydration, or home remedies, and Ice/heat application.  Symptoms are: patient states better than they were.  Triage Disposition: Go to ED Now (Notify PCP)  Patient/caregiver understands and will follow disposition?: Yes               Copied from CRM [id number]. Topic: Clinical - Red Word Triage >> Nov 08, 2024  2:46 PM Deleta RAMAN wrote: Red Word that prompted transfer to Nurse Triage: patient has bad migraines, soreness in face and head, and dizziness with blurry vision Reason for Disposition  Loss of vision or double vision  (Exception: Same as previously diagnosed migraines.)  Answer Assessment - Initial Assessment Questions Patient states that around 12:30am she had a sharp pain on her left side---in/behind left eye Felt this from her shoulder to the back of her neck Patient took two Tylenol  and drank some water . Patient states it got worse and felt like a tightening Patient tried a cold compress---this did not help Patient states she did have some blurry vision & dizziness Patient states that her symptoms have eased up some  She denies any other symptoms but headache, blurry vision, and dizziness. Patient states she called her neurologist and was advised to call her PCP   Patient described it as the worst headache she has ever had She is advised that the Emergency Room is recommended at this time and this RN asked if she had anyone with her. She states that she is going to call her daughter. She is advised that it is recommended that someone else drive her and she is also advised to call 911 if symptoms worsen. Patient also states that if she doesn't go today, she will go  tomorrow. This RN offered to call her daughter for her and she states that she will call her daughter after our conversation.  This RN advised patient to call us  back at any point with any further questions/concerns. She verbalized understanding.  Protocols used: [facility]

## 2024-11-29 ENCOUNTER — Ambulatory Visit: Payer: Self-pay | Admitting: Internal Medicine

## 2024-11-29 LAB — COLOGUARD

## 2024-12-08 ENCOUNTER — Telehealth: Payer: Self-pay

## 2024-12-08 NOTE — Telephone Encounter (Signed)
 Copied from CRM [id number]. Topic: Clinical - Medication Refill >> Dec 08, 2024  9:52 AM Vena HERO wrote: Medication: meloxicam  (MOBIC ) 15 MG tablet  Has the patient contacted their pharmacy? No (Agent: If no, request that the patient contact the pharmacy for the refill. If patient does not wish to contact the pharmacy document the reason why and proceed with request.) (Agent: If yes, when and what did the pharmacy advise?)  This is the patient's preferred pharmacy:  [facility] [phone] - [place], [place] - [address] AT [facility] [address] [place] [address] Phone: [phone] Fax: [phone]  Is this the correct pharmacy for this prescription? Yes If no, delete pharmacy and type the correct one.   Has the prescription been filled recently? No  Is the patient out of the medication? Yes  Has the patient been seen for an appointment in the last year OR does the patient have an upcoming appointment? Yes  Can we respond through MyChart? Yes, phone call preferred  Agent: Please be advised that Rx refills may take up to 3 business days. We ask that you follow-up with your pharmacy.

## 2024-12-08 NOTE — Telephone Encounter (Signed)
 Copied from CRM [id number]. Topic: General - Other >> Dec 08, 2024 10:00 AM Sasha M wrote: Reason for CRM: Pt called in with questions about her recent cologuard test. She is not sure why the test is insufficient and would like more insight before the second kit comes. She has not received it yet  and plans to call the office once it does to ask questions to make sure she is doing things correctly.

## 2024-12-09 ENCOUNTER — Encounter: Admitting: Physician Assistant

## 2024-12-09 ENCOUNTER — Other Ambulatory Visit: Payer: Self-pay

## 2024-12-09 MED ORDER — MELOXICAM 15 MG PO TABS: 15.0000 mg | ORAL_TABLET | Freq: Every day | ORAL | 0 refills | Status: AC

## 2024-12-09 NOTE — Telephone Encounter (Signed)
 Refill has been sent.

## 2024-12-22 ENCOUNTER — Ambulatory Visit: Admitting: Adult Health

## 2025-01-13 ENCOUNTER — Ambulatory Visit (INDEPENDENT_AMBULATORY_CARE_PROVIDER_SITE_OTHER): Admitting: Otolaryngology

## 2025-05-11 ENCOUNTER — Ambulatory Visit: Admitting: Adult Health

## 2025-06-08 ENCOUNTER — Encounter: Admitting: Family Medicine
